# Patient Record
Sex: Female | Born: 1950 | Race: Black or African American | Hispanic: No | Marital: Married | Smoking: Former smoker
Health system: Southern US, Community
[De-identification: ages and names within clinical notes are randomized; demographics above are authoritative.]

## PROBLEM LIST (undated history)

## (undated) DIAGNOSIS — R4189 Other symptoms and signs involving cognitive functions and awareness: Secondary | ICD-10-CM

## (undated) DIAGNOSIS — E079 Disorder of thyroid, unspecified: Secondary | ICD-10-CM

## (undated) DIAGNOSIS — K219 Gastro-esophageal reflux disease without esophagitis: Secondary | ICD-10-CM

## (undated) DIAGNOSIS — E039 Hypothyroidism, unspecified: Secondary | ICD-10-CM

## (undated) DIAGNOSIS — F329 Major depressive disorder, single episode, unspecified: Secondary | ICD-10-CM

## (undated) DIAGNOSIS — F419 Anxiety disorder, unspecified: Secondary | ICD-10-CM

## (undated) DIAGNOSIS — I10 Essential (primary) hypertension: Secondary | ICD-10-CM

## (undated) DIAGNOSIS — N390 Urinary tract infection, site not specified: Secondary | ICD-10-CM

## (undated) DIAGNOSIS — D649 Anemia, unspecified: Secondary | ICD-10-CM

## (undated) DIAGNOSIS — Z803 Family history of malignant neoplasm of breast: Secondary | ICD-10-CM

## (undated) DIAGNOSIS — F32A Depression, unspecified: Secondary | ICD-10-CM

## (undated) DIAGNOSIS — Z860100 Personal history of colon polyps, unspecified: Secondary | ICD-10-CM

## (undated) DIAGNOSIS — E785 Hyperlipidemia, unspecified: Secondary | ICD-10-CM

## (undated) DIAGNOSIS — W57XXXA Bitten or stung by nonvenomous insect and other nonvenomous arthropods, initial encounter: Secondary | ICD-10-CM

## (undated) DIAGNOSIS — E7800 Pure hypercholesterolemia, unspecified: Secondary | ICD-10-CM

## (undated) DIAGNOSIS — Z800 Family history of malignant neoplasm of digestive organs: Secondary | ICD-10-CM

## (undated) DIAGNOSIS — Z801 Family history of malignant neoplasm of trachea, bronchus and lung: Secondary | ICD-10-CM

## (undated) DIAGNOSIS — M1990 Unspecified osteoarthritis, unspecified site: Secondary | ICD-10-CM

## (undated) DIAGNOSIS — Z8041 Family history of malignant neoplasm of ovary: Secondary | ICD-10-CM

## (undated) HISTORY — DX: Family history of malignant neoplasm of digestive organs: Z80.0

## (undated) HISTORY — DX: Major depressive disorder, single episode, unspecified: F32.9

## (undated) HISTORY — PX: POLYPECTOMY: SHX149

## (undated) HISTORY — DX: Personal history of colon polyps, unspecified: Z86.0100

## (undated) HISTORY — DX: Other symptoms and signs involving cognitive functions and awareness: R41.89

## (undated) HISTORY — DX: Family history of malignant neoplasm of breast: Z80.3

## (undated) HISTORY — DX: Family history of malignant neoplasm of ovary: Z80.41

## (undated) HISTORY — DX: Personal history of colonic polyps: Z86.010

## (undated) HISTORY — DX: Family history of malignant neoplasm of trachea, bronchus and lung: Z80.1

## (undated) HISTORY — DX: Hyperlipidemia, unspecified: E78.5

## (undated) HISTORY — DX: Anxiety disorder, unspecified: F41.9

## (undated) HISTORY — PX: COLONOSCOPY: SHX174

---

## 1999-03-18 ENCOUNTER — Encounter: Payer: Self-pay | Admitting: Obstetrics and Gynecology

## 1999-03-18 ENCOUNTER — Ambulatory Visit (HOSPITAL_COMMUNITY): Admission: RE | Admit: 1999-03-18 | Discharge: 1999-03-18 | Payer: Self-pay | Admitting: Obstetrics and Gynecology

## 1999-09-24 ENCOUNTER — Emergency Department (HOSPITAL_COMMUNITY): Admission: EM | Admit: 1999-09-24 | Discharge: 1999-09-24 | Payer: Self-pay | Admitting: Emergency Medicine

## 2000-06-13 ENCOUNTER — Encounter: Payer: Self-pay | Admitting: Family Medicine

## 2000-06-13 ENCOUNTER — Ambulatory Visit (HOSPITAL_COMMUNITY): Admission: RE | Admit: 2000-06-13 | Discharge: 2000-06-13 | Payer: Self-pay | Admitting: Family Medicine

## 2002-02-25 ENCOUNTER — Encounter: Admission: RE | Admit: 2002-02-25 | Discharge: 2002-02-25 | Payer: Self-pay | Admitting: Family Medicine

## 2002-02-25 ENCOUNTER — Encounter: Payer: Self-pay | Admitting: Family Medicine

## 2002-03-27 ENCOUNTER — Encounter (INDEPENDENT_AMBULATORY_CARE_PROVIDER_SITE_OTHER): Payer: Self-pay | Admitting: Specialist

## 2002-03-27 ENCOUNTER — Ambulatory Visit (HOSPITAL_COMMUNITY): Admission: RE | Admit: 2002-03-27 | Discharge: 2002-03-27 | Payer: Self-pay | Admitting: Gastroenterology

## 2004-01-28 ENCOUNTER — Ambulatory Visit (HOSPITAL_COMMUNITY): Admission: RE | Admit: 2004-01-28 | Discharge: 2004-01-28 | Payer: Self-pay | Admitting: Family Medicine

## 2005-03-28 ENCOUNTER — Other Ambulatory Visit: Admission: RE | Admit: 2005-03-28 | Discharge: 2005-03-28 | Payer: Self-pay | Admitting: Obstetrics and Gynecology

## 2005-04-15 ENCOUNTER — Ambulatory Visit (HOSPITAL_COMMUNITY): Admission: RE | Admit: 2005-04-15 | Discharge: 2005-04-15 | Payer: Self-pay | Admitting: Family Medicine

## 2006-09-18 ENCOUNTER — Emergency Department (HOSPITAL_COMMUNITY): Admission: EM | Admit: 2006-09-18 | Discharge: 2006-09-18 | Payer: Self-pay | Admitting: Family Medicine

## 2007-04-26 ENCOUNTER — Other Ambulatory Visit: Admission: RE | Admit: 2007-04-26 | Discharge: 2007-04-26 | Payer: Self-pay | Admitting: Family Medicine

## 2007-05-07 ENCOUNTER — Ambulatory Visit (HOSPITAL_COMMUNITY): Admission: RE | Admit: 2007-05-07 | Discharge: 2007-05-07 | Payer: Self-pay | Admitting: Family Medicine

## 2008-07-02 ENCOUNTER — Ambulatory Visit (HOSPITAL_COMMUNITY): Admission: RE | Admit: 2008-07-02 | Discharge: 2008-07-02 | Payer: Self-pay | Admitting: Family Medicine

## 2009-05-20 ENCOUNTER — Other Ambulatory Visit: Admission: RE | Admit: 2009-05-20 | Discharge: 2009-05-20 | Payer: Self-pay | Admitting: Family Medicine

## 2009-08-10 ENCOUNTER — Ambulatory Visit (HOSPITAL_COMMUNITY): Admission: RE | Admit: 2009-08-10 | Discharge: 2009-08-10 | Payer: Self-pay | Admitting: Family Medicine

## 2010-06-24 ENCOUNTER — Other Ambulatory Visit (HOSPITAL_COMMUNITY)
Admission: RE | Admit: 2010-06-24 | Discharge: 2010-06-24 | Disposition: A | Discharge status: Home / Self Care | Payer: Managed Care, Other (non HMO) | Source: Ambulatory Visit | Attending: Family Medicine | Admitting: Family Medicine

## 2010-06-24 ENCOUNTER — Other Ambulatory Visit: Payer: Self-pay | Admitting: Physician Assistant

## 2010-06-24 DIAGNOSIS — Z01419 Encounter for gynecological examination (general) (routine) without abnormal findings: Secondary | ICD-10-CM | POA: Insufficient documentation

## 2010-08-30 ENCOUNTER — Other Ambulatory Visit (HOSPITAL_COMMUNITY): Payer: Self-pay | Admitting: Family Medicine

## 2010-08-30 DIAGNOSIS — Z1231 Encounter for screening mammogram for malignant neoplasm of breast: Secondary | ICD-10-CM

## 2010-09-03 NOTE — Op Note (Signed)
NAME:  Karen [name], Karen [name]                        ACCOUNT NO.:  [id number]   MEDICAL RECORD NO.:  [id number]                   PATIENT TYPE:  AMB   LOCATION:  ENDO                                 FACILITY:  [facility]   PHYSICIAN:  Danise Edge, M.D.                DATE OF BIRTH:  May 04, 1950   DATE OF PROCEDURE:  03/27/2002  DATE OF DISCHARGE:                                 OPERATIVE REPORT   PROCEDURES:  1. Esophagogastroduodenoscopy.  2. Small bowel biopsy.  3. Colonoscopy.  4. Polypectomy.   PROCEDURE INDICATION:  Ms. Karen [name] is a 60 year old female, born  February 17, 1961.  Ms. Karen [name] has chronic constipation associated with  abdominal pain.  She has chronic iron deficiency anemia, probably secondary  to menorrhagia.  She recently was evaluated by Bryan Lemma. Manus Gunning, M.D.  Her  stool was guaiac-positive.  Her hemoglobin was 7.4 grams with a normal white  blood cell count and elevated platelet count.  Her MCV was microcytic.   Ms. Karen [name] has chronic gastroesophageal reflux, manifested by nocturnal  heartburn without dysphagia or odynophagia.   MEDICATIONS ALLERGIES:  None.   CHRONIC MEDICATIONS:  Triamterene, Accupril, Lexapro, Zantac.   PAST MEDICAL HISTORY:  1. Chronic gastroesophageal reflux.  2. Chronic iron deficiency anemia.  3. Menorrhagia.  4. Chronic constipation with abdominal pain.  5. Hypertension.  6. Remote cesarean section.  7. Uterine fibroids.   FAMILY HISTORY:  Negative for colon cancer.   ENDOSCOPIST:  Charolett Bumpers, M.D.   PREMEDICATION:  Versed 10 mg, Demerol 100 mg.   ENDOSCOPE:  Olympus pediatric colonoscope.   DESCRIPTION OF PROCEDURE:  Esophagogastroduodenoscopy:  After obtaining  informed consent, Ms. Karen [name] was placed in the left lateral decubitus  position.  I administered intravenous Demerol and intravenous Versed to  achieve conscious sedation for the procedure.  The patient's blood pressure,  oxygen saturation, and  cardiac rhythm were monitored throughout the  procedure and documented in the medical record.   The Olympus pediatric colonoscope was passed through the posterior  hypopharynx into the proximal esophagus without difficulty.  The hypopharynx  and larynx appeared normal.  I did not visualize the vocal cords.   ESOPHAGOSCOPY:  The proximal, mid, and lower segments of the esophagus  appear normal.   GASTROSCOPY:  There is a moderate-sized hiatal hernia.  Retroflexed view of  the gastric cardia and fundus was normal.  The gastric body, antrum, and  pylorus appeared normal.   DUODENOSCOPY:  The duodenal bulb, mid duodenum, and distal duodenum appear  normal.   Small bowel biopsies:  Two small bowel biopsies were taken from the second  portion of the duodenum.   ASSESSMENT:  Normal esophagogastroduodenoscopy except for the presence of a  moderate-sized hiatal hernia.  No signs of upper gastrointestinal bleeding.  Small bowel biopsies to rule out celiac sprue pending.   PROCEDURE:  Proctocolonoscopy to the cecum:  Anal inspection  was normal.  Digital rectal exam was normal.  The Olympus pediatric video colonoscope was  introduced into the rectum and advanced to the cecum.  Colonic preparation  for the exam today was satisfactory.   RECTUM:  Large internal hemorrhoids.  From the proximal rectum, a 1 mm  sessile polyp was removed with the electrocautery snare.  SIGMOID COLON AND DESCENDING COLON:  Normal.  SPLENIC FLEXURE:  Normal.  TRANSVERSE COLON:  Normal.  HEPATIC FLEXURE:  Normal.  ASCENDING COLON:  Normal.  CECUM AND ILEOCECAL VALVE:  Normal.   ASSESSMENT:  1. No signs of lower gastrointestinal bleeding.  2. From the proximal rectum, a 1 mm sessile polyp was removed.  3. Large internal hemorrhoids.   RECOMMENDATIONS:  If rectal polyp returns neoplastic pathologically, Ms.  Karen [name] would undergo a repeat colonoscopy in five years.                                                Danise Edge, M.D.    MJ/MEDQ  D:  03/27/2002  T:  03/27/2002  Job:  [id number]   cc:   Bryan Lemma. Manus Gunning, M.D.  [address] [place]  [place] [address]  Fax: [phone]

## 2010-09-08 ENCOUNTER — Ambulatory Visit (HOSPITAL_COMMUNITY): Payer: Managed Care, Other (non HMO)

## 2010-10-26 ENCOUNTER — Ambulatory Visit (HOSPITAL_COMMUNITY)
Admission: RE | Admit: 2010-10-26 | Discharge: 2010-10-26 | Disposition: A | Discharge status: Home / Self Care | Payer: Managed Care, Other (non HMO) | Source: Ambulatory Visit | Attending: Family Medicine | Admitting: Family Medicine

## 2010-10-26 DIAGNOSIS — Z1231 Encounter for screening mammogram for malignant neoplasm of breast: Secondary | ICD-10-CM | POA: Insufficient documentation

## 2011-10-19 ENCOUNTER — Other Ambulatory Visit (HOSPITAL_COMMUNITY): Payer: Self-pay | Admitting: Family Medicine

## 2011-10-19 DIAGNOSIS — Z1231 Encounter for screening mammogram for malignant neoplasm of breast: Secondary | ICD-10-CM

## 2011-11-10 ENCOUNTER — Ambulatory Visit (HOSPITAL_COMMUNITY)
Admission: RE | Admit: 2011-11-10 | Discharge: 2011-11-10 | Disposition: A | Discharge status: Home / Self Care | Payer: Managed Care, Other (non HMO) | Source: Ambulatory Visit | Attending: Family Medicine | Admitting: Family Medicine

## 2011-11-10 DIAGNOSIS — Z1231 Encounter for screening mammogram for malignant neoplasm of breast: Secondary | ICD-10-CM

## 2013-07-30 ENCOUNTER — Other Ambulatory Visit (HOSPITAL_COMMUNITY)
Admission: RE | Admit: 2013-07-30 | Discharge: 2013-07-30 | Disposition: A | Discharge status: Home / Self Care | Payer: Managed Care, Other (non HMO) | Source: Ambulatory Visit | Attending: Family Medicine | Admitting: Family Medicine

## 2013-07-30 ENCOUNTER — Other Ambulatory Visit: Payer: Self-pay | Admitting: Family Medicine

## 2013-07-30 DIAGNOSIS — Z124 Encounter for screening for malignant neoplasm of cervix: Secondary | ICD-10-CM | POA: Insufficient documentation

## 2013-09-12 ENCOUNTER — Other Ambulatory Visit (HOSPITAL_COMMUNITY): Payer: Self-pay | Admitting: Family Medicine

## 2013-09-12 DIAGNOSIS — Z1231 Encounter for screening mammogram for malignant neoplasm of breast: Secondary | ICD-10-CM

## 2013-09-19 ENCOUNTER — Ambulatory Visit (HOSPITAL_COMMUNITY)
Admission: RE | Admit: 2013-09-19 | Discharge: 2013-09-19 | Disposition: A | Discharge status: Home / Self Care | Payer: Medicare HMO | Source: Ambulatory Visit | Attending: Family Medicine | Admitting: Family Medicine

## 2013-09-19 DIAGNOSIS — Z1231 Encounter for screening mammogram for malignant neoplasm of breast: Secondary | ICD-10-CM | POA: Insufficient documentation

## 2014-12-31 ENCOUNTER — Emergency Department (HOSPITAL_COMMUNITY)
Admission: EM | Admit: 2014-12-31 | Discharge: 2015-01-01 | Disposition: A | Discharge status: Home / Self Care | Payer: Managed Care, Other (non HMO) | Attending: Emergency Medicine | Admitting: Emergency Medicine

## 2014-12-31 ENCOUNTER — Encounter (HOSPITAL_COMMUNITY): Payer: Self-pay | Admitting: Emergency Medicine

## 2014-12-31 DIAGNOSIS — M436 Torticollis: Secondary | ICD-10-CM | POA: Insufficient documentation

## 2014-12-31 DIAGNOSIS — Z79899 Other long term (current) drug therapy: Secondary | ICD-10-CM | POA: Diagnosis not present

## 2014-12-31 DIAGNOSIS — Z7982 Long term (current) use of aspirin: Secondary | ICD-10-CM | POA: Insufficient documentation

## 2014-12-31 DIAGNOSIS — E079 Disorder of thyroid, unspecified: Secondary | ICD-10-CM | POA: Insufficient documentation

## 2014-12-31 DIAGNOSIS — M542 Cervicalgia: Secondary | ICD-10-CM | POA: Diagnosis present

## 2014-12-31 DIAGNOSIS — I10 Essential (primary) hypertension: Secondary | ICD-10-CM | POA: Diagnosis not present

## 2014-12-31 HISTORY — DX: Disorder of thyroid, unspecified: E07.9

## 2014-12-31 HISTORY — DX: Essential (primary) hypertension: I10

## 2014-12-31 NOTE — ED Notes (Signed)
Pt reports posterior neck pain and severe stiffness. Now pain is moving around to anterior part of neck and shoulders. Says the stiffness is so bad she cannot move her neck or head. Recently had a cold/congestion. Denies productive cough. Says she has had "a bad feeling/sneezy runny nose/sore throat. Now it's like severe joint pain." No SOB noted. RR even/unlabored. Did not have flu shot.

## 2015-01-01 LAB — BASIC METABOLIC PANEL
Anion gap: 8 (ref 5–15)
BUN: 23 mg/dL — ABNORMAL HIGH (ref 6–20)
CALCIUM: 9.7 mg/dL (ref 8.9–10.3)
CHLORIDE: 101 mmol/L (ref 101–111)
CO2: 29 mmol/L (ref 22–32)
CREATININE: 0.82 mg/dL (ref 0.44–1.00)
Glucose, Bld: 117 mg/dL — ABNORMAL HIGH (ref 65–99)
Potassium: 3.4 mmol/L — ABNORMAL LOW (ref 3.5–5.1)
SODIUM: 138 mmol/L (ref 135–145)

## 2015-01-01 LAB — CBC WITH DIFFERENTIAL/PLATELET
BASOS PCT: 0 %
Basophils Absolute: 0 10*3/uL (ref 0.0–0.1)
EOS ABS: 0.1 10*3/uL (ref 0.0–0.7)
Eosinophils Relative: 1 %
HCT: 37.1 % (ref 36.0–46.0)
HEMOGLOBIN: 11.8 g/dL — AB (ref 12.0–15.0)
Lymphocytes Relative: 28 %
Lymphs Abs: 1.9 10*3/uL (ref 0.7–4.0)
MCH: 28.4 pg (ref 26.0–34.0)
MCHC: 31.8 g/dL (ref 30.0–36.0)
MCV: 89.2 fL (ref 78.0–100.0)
MONOS PCT: 7 %
Monocytes Absolute: 0.5 10*3/uL (ref 0.1–1.0)
NEUTROS PCT: 64 %
Neutro Abs: 4.5 10*3/uL (ref 1.7–7.7)
PLATELETS: 275 10*3/uL (ref 150–400)
RBC: 4.16 MIL/uL (ref 3.87–5.11)
RDW: 13.2 % (ref 11.5–15.5)
WBC: 7 10*3/uL (ref 4.0–10.5)

## 2015-01-01 MED ORDER — KETOROLAC TROMETHAMINE 30 MG/ML IJ SOLN
30.0000 mg | Freq: Once | INTRAMUSCULAR | Status: AC
  Administered 2015-01-01: 30 mg via INTRAVENOUS
  Filled 2015-01-01: qty 1

## 2015-01-01 MED ORDER — METHOCARBAMOL 1000 MG/10ML IJ SOLN
1000.0000 mg | Freq: Once | INTRAVENOUS | Status: AC
  Administered 2015-01-01: 1000 mg via INTRAVENOUS
  Filled 2015-01-01: qty 10

## 2015-01-01 MED ORDER — SODIUM CHLORIDE 0.9 % IV BOLUS (SEPSIS)
1000.0000 mL | Freq: Once | INTRAVENOUS | Status: AC
  Administered 2015-01-01: 1000 mL via INTRAVENOUS

## 2015-01-01 MED ORDER — METHOCARBAMOL 1000 MG/10ML IJ SOLN: 1000.0000 mg | Freq: Once | INTRAMUSCULAR | Status: DC

## 2015-01-01 MED ORDER — HYDROCODONE-ACETAMINOPHEN 5-325 MG PO TABS: 1.0000 | ORAL_TABLET | Freq: Four times a day (QID) | ORAL | Status: DC | PRN

## 2015-01-01 MED ORDER — MORPHINE SULFATE (PF) 4 MG/ML IV SOLN
4.0000 mg | Freq: Once | INTRAVENOUS | Status: AC
  Administered 2015-01-01: 4 mg via INTRAVENOUS
  Filled 2015-01-01: qty 1

## 2015-01-01 MED ORDER — DIAZEPAM 5 MG PO TABS: 5.0000 mg | ORAL_TABLET | Freq: Four times a day (QID) | ORAL | Status: DC | PRN

## 2015-01-01 MED ORDER — LORAZEPAM 2 MG/ML IJ SOLN
0.5000 mg | Freq: Once | INTRAMUSCULAR | Status: AC
  Administered 2015-01-01: 0.5 mg via INTRAVENOUS
  Filled 2015-01-01: qty 1

## 2015-01-01 MED ORDER — METHOCARBAMOL 500 MG PO TABS: 1000.0000 mg | ORAL_TABLET | Freq: Three times a day (TID) | ORAL | Status: DC | PRN

## 2015-01-01 MED ORDER — IBUPROFEN 600 MG PO TABS: 600.0000 mg | ORAL_TABLET | Freq: Four times a day (QID) | ORAL | Status: DC | PRN

## 2015-01-01 NOTE — ED Notes (Signed)
Patient states throbbing has resolved, still c/o stiffness. Patient ambulatory with minimal assistance to restroom.

## 2015-01-01 NOTE — ED Notes (Signed)
Patient c/o ongoing neck pain for several days, states this evening it began to radiate into her head. Patient states she is unable to move her neck. Patient states she has used heat, and muscle creams at home without relief.

## 2015-01-01 NOTE — ED Provider Notes (Signed)
CSN: [id number]     Arrival date & time 12/31/14  2320 History  This chart was scribed for Julianne Rice, MD by Randa Evens, ED Scribe. This patient was seen in room WA08/WA08 and the patient's care was started at 1:24 AM.      Chief Complaint  Patient presents with  . Neck Stiffness   . Shoulder Pain   The history is provided by the patient. No language interpreter was used.   HPI Comments: Karen [name] is a 64 y.o. female who presents to the Emergency Department complaining of worsening right sided neck pain and stiffness onset 1 week prior. Pt describes the neck pain as a soreness. Pt states that the pain radiates up the posterior aspect of her head. Pt does report URI symptoms 2 weeks prior. Pt states that she has been taking Advil with no relief. No fever or chills. Pt denies CP, nausea, vomiting or other related symptoms. No focal weakness or numbness.    Past Medical History  Diagnosis Date  . Hypertension   . Thyroid disease    Past Surgical History  Procedure Laterality Date  . Cesarean section     History reviewed. No pertinent family history. Social History  Substance Use Topics  . Smoking status: Never Smoker   . Smokeless tobacco: None  . Alcohol Use: No   OB History    No data available     Review of Systems  Constitutional: Negative for fever and chills.  HENT: Negative for congestion, sinus pressure and sore throat.   Eyes: Negative for visual disturbance.  Respiratory: Negative for shortness of breath.   Cardiovascular: Negative for chest pain.  Gastrointestinal: Negative for nausea, vomiting and abdominal pain.  Musculoskeletal: Positive for myalgias, neck pain and neck stiffness.  Skin: Negative for rash and wound.  Neurological: Negative for dizziness, weakness, numbness and headaches.  All other systems reviewed and are negative.    Allergies  Zoloft  Home Medications   Prior to Admission medications   Medication Sig Start Date End  Date Taking? Authorizing Provider  aspirin 325 MG tablet Take 325 mg by mouth daily.   Yes Historical Provider, MD  atenolol (TENORMIN) 50 MG tablet Take 50 mg by mouth daily. for blood pressure 11/03/14  Yes Historical Provider, MD  cholecalciferol (VITAMIN D) 1000 UNITS tablet Take 1,000 Units by mouth daily.   Yes Historical Provider, MD  CRESTOR 5 MG tablet Take 1 tablet by mouth every morning. 11/30/14  Yes Historical Provider, MD  levothyroxine (SYNTHROID, LEVOTHROID) 50 MCG tablet Take 1 tablet by mouth daily. 10/27/14  Yes Historical Provider, MD  Multiple Vitamin (MULTIVITAMIN WITH MINERALS) TABS tablet Take 1 tablet by mouth daily.   Yes Historical Provider, MD  traMADol (ULTRAM) 50 MG tablet Take 1 tablet by mouth See admin instructions. Up to twice daily as needed for pain 12/02/14  Yes Historical Provider, MD  triamterene-hydrochlorothiazide (MAXZIDE) 75-50 MG per tablet Take 1 tablet by mouth every morning. 10/27/14  Yes Historical Provider, MD  vitamin B-12 (CYANOCOBALAMIN) 1000 MCG tablet Take 1,000 mcg by mouth daily.   Yes Historical Provider, MD  vitamin C (ASCORBIC ACID) 500 MG tablet Take 500 mg by mouth daily.   Yes Historical Provider, MD  diazepam (VALIUM) 5 MG tablet Take 1 tablet (5 mg total) by mouth every 6 (six) hours as needed for muscle spasms (spasms). 01/01/15   Julianne Rice, MD  HYDROcodone-acetaminophen (NORCO) 5-325 MG per tablet Take 1-2 tablets by mouth every  6 (six) hours as needed for severe pain. 01/01/15   Julianne Rice, MD  ibuprofen (ADVIL,MOTRIN) 600 MG tablet Take 1 tablet (600 mg total) by mouth every 6 (six) hours as needed for moderate pain. 01/01/15   Julianne Rice, MD  methocarbamol (ROBAXIN) 500 MG tablet Take 2 tablets (1,000 mg total) by mouth every 8 (eight) hours as needed for muscle spasms. 01/01/15   Julianne Rice, MD   BP 140/78 mmHg  Pulse 87  Temp(Src) 98.4 F (36.9 C) (Oral)  Resp 18  Ht 5\' 1"  (1.549 m)  Wt 206 lb (93.441 kg)  BMI  38.94 kg/m2  SpO2 98%   Physical Exam  Constitutional: She is oriented to person, place, and time. She appears well-developed and well-nourished. No distress.  HENT:  Head: Normocephalic and atraumatic.  Mouth/Throat: Oropharynx is clear and moist. No oropharyngeal exudate.  Eyes: EOM are normal. Pupils are equal, round, and reactive to light.  Neck:  Patient with decreased range of motion of her neck. This to palpation over the right paracervical and right sternocleidomastoid with spasm appreciated. No tenderness to palpation in the posterior midline cervical spine.  Cardiovascular: Normal rate and regular rhythm.   Pulmonary/Chest: Effort normal and breath sounds normal. No respiratory distress. She has no wheezes. She has no rales.  Abdominal: Soft. Bowel sounds are normal. She exhibits no distension and no mass. There is no tenderness. There is no rebound and no guarding.  Musculoskeletal: Normal range of motion. She exhibits no edema or tenderness.  Neurological: She is alert and oriented to person, place, and time.  5/5 motor in all extremities. Sensation is fully intact.  Skin: Skin is warm and dry. No rash noted. No erythema.  Psychiatric: She has a normal mood and affect. Her behavior is normal.  Nursing note and vitals reviewed.   ED Course  Procedures (including critical care time) DIAGNOSTIC STUDIES: Oxygen Saturation is 100% on RA, normal by my interpretation.    COORDINATION OF CARE: 1:55 AM-Discussed treatment plan with pt at bedside and pt agreed to plan.     Labs Review Labs Reviewed  CBC WITH DIFFERENTIAL/PLATELET - Abnormal; Notable for the following:    Hemoglobin 11.8 (*)    All other components within normal limits  BASIC METABOLIC PANEL - Abnormal; Notable for the following:    Potassium 3.4 (*)    Glucose, Bld 117 (*)    BUN 23 (*)    All other components within normal limits    Imaging Review No results found.    EKG Interpretation None       MDM   Final diagnoses:  Torticollis      I personally performed the services described in this documentation, which was scribed in my presence. The recorded information has been reviewed and is accurate.  Patient with ongoing neck pain and stiffness for 1 week. No fever or headache. No focal neurologic symptoms. Muscle spasm appreciated on exam. Suspect torticollis. Less suspicious for meningitis.  Patient's symptoms have improved. She now has some neck movement. Vital signs remain stable. She has a normal white blood cell count. Again I think infectious cause for the patient's symptoms are very unlikely. We'll re-dose medication. Anticipate discharge home.  Patient says she is feeling much better. Has increased range of motion of her neck. No meningismus appreciated. Return precautions given. Advised to follow-up with her primary physician  Julianne Rice, MD 01/01/15 [phone]

## 2015-01-01 NOTE — Discharge Instructions (Signed)

## 2015-03-25 ENCOUNTER — Other Ambulatory Visit: Payer: Self-pay

## 2015-03-25 DIAGNOSIS — Z1231 Encounter for screening mammogram for malignant neoplasm of breast: Secondary | ICD-10-CM

## 2015-03-27 ENCOUNTER — Ambulatory Visit
Admission: RE | Admit: 2015-03-27 | Discharge: 2015-03-27 | Disposition: A | Discharge status: Home / Self Care | Payer: Managed Care, Other (non HMO) | Source: Ambulatory Visit

## 2015-03-27 DIAGNOSIS — Z1231 Encounter for screening mammogram for malignant neoplasm of breast: Secondary | ICD-10-CM

## 2015-08-17 DIAGNOSIS — W57XXXA Bitten or stung by nonvenomous insect and other nonvenomous arthropods, initial encounter: Secondary | ICD-10-CM

## 2015-08-17 HISTORY — DX: Bitten or stung by nonvenomous insect and other nonvenomous arthropods, initial encounter: W57.XXXA

## 2015-08-26 ENCOUNTER — Other Ambulatory Visit: Payer: Self-pay | Admitting: Family Medicine

## 2015-08-26 DIAGNOSIS — R1084 Generalized abdominal pain: Secondary | ICD-10-CM

## 2015-09-03 ENCOUNTER — Ambulatory Visit
Admission: RE | Admit: 2015-09-03 | Discharge: 2015-09-03 | Disposition: A | Discharge status: Home / Self Care | Payer: Managed Care, Other (non HMO) | Source: Ambulatory Visit | Attending: Family Medicine | Admitting: Family Medicine

## 2015-09-03 DIAGNOSIS — R1084 Generalized abdominal pain: Secondary | ICD-10-CM

## 2015-10-17 DIAGNOSIS — N390 Urinary tract infection, site not specified: Secondary | ICD-10-CM

## 2015-10-17 HISTORY — DX: Urinary tract infection, site not specified: N39.0

## 2015-10-31 ENCOUNTER — Ambulatory Visit (INDEPENDENT_AMBULATORY_CARE_PROVIDER_SITE_OTHER): Payer: Managed Care, Other (non HMO) | Admitting: Family Medicine

## 2015-10-31 VITALS — BP 138/80 | HR 82 | Temp 98.1°F | Resp 14 | Ht 62.0 in | Wt 193.0 lb

## 2015-10-31 DIAGNOSIS — N760 Acute vaginitis: Secondary | ICD-10-CM

## 2015-10-31 LAB — POC MICROSCOPIC URINALYSIS (UMFC)

## 2015-10-31 LAB — POCT WET + KOH PREP
TRICH BY WET PREP: ABSENT
YEAST BY KOH: ABSENT
YEAST BY WET PREP: ABSENT

## 2015-10-31 LAB — POCT URINALYSIS DIP (MANUAL ENTRY)
BILIRUBIN UA: NEGATIVE
BILIRUBIN UA: NEGATIVE
Glucose, UA: NEGATIVE
NITRITE UA: NEGATIVE
PH UA: 7
PROTEIN UA: NEGATIVE
Spec Grav, UA: 1.015
Urobilinogen, UA: 1

## 2015-10-31 MED ORDER — CEPHALEXIN 500 MG PO CAPS: 500.0000 mg | ORAL_CAPSULE | Freq: Two times a day (BID) | ORAL | Status: DC

## 2015-10-31 MED ORDER — FLUCONAZOLE 150 MG PO TABS: 150.0000 mg | ORAL_TABLET | Freq: Once | ORAL | Status: DC

## 2015-10-31 NOTE — Progress Notes (Signed)
Karen [name] is a 65 y.o. female who presents to [facility] today for vaginal irritation for the last few weeks. Patient denies significant discharge dysuria and urinary frequency or urgency. She denies any fevers chills nausea vomiting or diarrhea. She notes these symptoms were preceded by a course of antibiotics and a wellness vaginal exam. She has tried some home remedies but no over-the-counter medications.   Past Medical History  Diagnosis Date  . Hypertension   . Thyroid disease    Past Surgical History  Procedure Laterality Date  . Cesarean section     Social History  Substance Use Topics  . Smoking status: Never Smoker   . Smokeless tobacco: Not on file  . Alcohol Use: No   ROS as above Medications: Current Outpatient Prescriptions  Medication Sig Dispense Refill  . aspirin 325 MG tablet Take 325 mg by mouth daily.    Marland Kitchen atenolol (TENORMIN) 50 MG tablet Take 50 mg by mouth daily. for blood pressure  3  . cholecalciferol (VITAMIN D) 1000 UNITS tablet Take 1,000 Units by mouth daily.    . CRESTOR 5 MG tablet Take 1 tablet by mouth every morning.  2  . levothyroxine (SYNTHROID, LEVOTHROID) 50 MCG tablet Take 1 tablet by mouth daily.  3  . traMADol (ULTRAM) 50 MG tablet Take 1 tablet by mouth See admin instructions. Up to twice daily as needed for pain  0  . triamterene-hydrochlorothiazide (MAXZIDE) 75-50 MG per tablet Take 1 tablet by mouth every morning.  3  . vitamin C (ASCORBIC ACID) 500 MG tablet Take 500 mg by mouth daily.    . cephALEXin (KEFLEX) 500 MG capsule Take 1 capsule (500 mg total) by mouth 2 (two) times daily. 14 capsule 0  . fluconazole (DIFLUCAN) 150 MG tablet Take 1 tablet (150 mg total) by mouth once. 1 tablet 0   No current facility-administered medications for this visit.   Allergies  Allergen Reactions  . Zoloft [Sertraline Hcl] Other (See Comments)    "crazy feeling"     Exam:  BP 138/80 mmHg  Pulse 82  Temp(Src) 98.1 F (36.7 C)  (Oral)  Resp 14  Ht 5\' 2"  (1.575 m)  Wt 193 lb (87.544 kg)  BMI 35.29 kg/m2  SpO2 96% Gen: Well NAD HEENT: EOMI,  MMM Lungs: Normal work of breathing. CTABL Heart: RRR no MRG Abd: NABS, Soft. Nondistended, Nontender Exts: Brisk capillary refill, warm and well perfused.  GYN: Normal external genitalia. Vaginal canal with thin clear discharge. Normal-appearing cervix. Nontender.  Results for orders placed or performed in visit on 10/31/15 (from the past 24 hour(s))  POCT Wet + KOH Prep     Status: Abnormal   Collection Time: 10/31/15 10:55 AM  Result Value Ref Range   Yeast by KOH Absent Present, Absent   Yeast by wet prep Absent Present, Absent   WBC by wet prep Moderate (A) None, Few, Too numerous to count   Clue Cells Wet Prep HPF POC None None, Too numerous to count   Trich by wet prep Absent Present, Absent   Bacteria Wet Prep HPF POC Few None, Few, Too numerous to count   Epithelial Cells By [employer] ([facility]) Few None, Few, Too numerous to count   RBC,UR,HPF,POC None None RBC/hpf  POCT urinalysis dipstick     Status: Abnormal   Collection Time: 10/31/15 10:55 AM  Result Value Ref Range   Color, UA yellow yellow   Clarity, UA cloudy (A) clear  Glucose, UA negative negative   Bilirubin, UA negative negative   Ketones, POC UA negative negative   Spec Grav, UA 1.015    Blood, UA small (A) negative   pH, UA 7.0    Protein Ur, POC negative negative   Urobilinogen, UA 1.0    Nitrite, UA Negative Negative   Leukocytes, UA Trace (A) Negative  POCT Microscopic Urinalysis ([facility])     Status: Abnormal   Collection Time: 10/31/15 10:55 AM  Result Value Ref Range   WBC,UR,HPF,POC Few (A) None WBC/hpf   RBC,UR,HPF,POC Moderate (A) None RBC/hpf   Bacteria Few (A) None, Too numerous to count   Mucus Present (A) Absent   Epithelial Cells, UR Per Microscopy Moderate (A) None, Too numerous to count cells/hpf   No results found.  Assessment and Plan: 65 y.o. female with Vaginal  irritation. Probable UTI based on wet prep results. Urine culture pending. Empiric treatment with Keflex. Backup fluconazole in case patient does not improve. Recommend patient follow-up with PCP for unaddressed health maintenance items as well as other health concerns.  Discussed warning signs or symptoms. Please see discharge instructions. Patient expresses understanding.

## 2015-10-31 NOTE — Patient Instructions (Addendum)
Thank you for coming in today. I think you have a UTI.  Take keflex twice daily.  If the itching does not get better take fluconazole for yeast infection.   Follow up with me as needed.   Follow up with me Dr [facility] Address: [address] [place], [place] [address] Phone: [phone]  Urinary Tract Infection Urinary tract infections (UTIs) can develop anywhere along your urinary tract. Your urinary tract is your body's drainage system for removing wastes and extra water. Your urinary tract includes two kidneys, two ureters, a bladder, and a urethra. Your kidneys are a pair of bean-shaped organs. Each kidney is about the size of your fist. They are located below your ribs, one on each side of your spine. CAUSES Infections are caused by microbes, which are microscopic organisms, including fungi, viruses, and bacteria. These organisms are so small that they can only be seen through a microscope. Bacteria are the microbes that most commonly cause UTIs. SYMPTOMS  Symptoms of UTIs may vary by age and gender of the patient and by the location of the infection. Symptoms in young women typically include a frequent and intense urge to urinate and a painful, burning feeling in the bladder or urethra during urination. Older women and men are more likely to be tired, shaky, and weak and have muscle aches and abdominal pain. A fever may mean the infection is in your kidneys. Other symptoms of a kidney infection include pain in your back or sides below the ribs, nausea, and vomiting. DIAGNOSIS To diagnose a UTI, your caregiver will ask you about your symptoms. Your caregiver will also ask you to provide a urine sample. The urine sample will be tested for bacteria and white blood cells. White blood cells are made by your body to help fight infection. TREATMENT  Typically, UTIs can be treated with medication. Because most UTIs are caused by a bacterial infection, they usually  can be treated with the use of antibiotics. The choice of antibiotic and length of treatment depend on your symptoms and the type of bacteria causing your infection. HOME CARE INSTRUCTIONS  If you were prescribed antibiotics, take them exactly as your caregiver instructs you. Finish the medication even if you feel better after you have only taken some of the medication.  Drink enough water and fluids to keep your urine clear or pale yellow.  Avoid caffeine, tea, and carbonated beverages. They tend to irritate your bladder.  Empty your bladder often. Avoid holding urine for long periods of time.  Empty your bladder before and after sexual intercourse.  After a bowel movement, women should cleanse from front to back. Use each tissue only once. SEEK MEDICAL CARE IF:   You have back pain.  You develop a fever.  Your symptoms do not begin to resolve within 3 days. SEEK IMMEDIATE MEDICAL CARE IF:   You have severe back pain or lower abdominal pain.  You develop chills.  You have nausea or vomiting.  You have continued burning or discomfort with urination. MAKE SURE YOU:   Understand these instructions.  Will watch your condition.  Will get help right away if you are not doing well or get worse.   This information is not intended to replace advice given to you by your health care provider. Make sure you discuss any questions you have with your health care provider.   Document Released: 01/12/2005 Document Revised: 12/24/2014 Document Reviewed: 05/13/2011 Elsevier Interactive Patient Education 2016  [employer].     IF you received an x-ray today, you will receive an invoice from [facility]. Please contact [facility] at [phone] with questions or concerns regarding your invoice.   IF you received labwork today, you will receive an invoice from [employer]. Please contact [facility] at [phone] with questions or concerns  regarding your invoice.   Our billing staff will not be able to assist you with questions regarding bills from these companies.  You will be contacted with the lab results as soon as they are available. The fastest way to get your results is to activate your My Chart account. Instructions are located on the last page of this paperwork. If you have not heard from [place] regarding the results in 2 weeks, please contact this office.

## 2015-11-01 LAB — URINE CULTURE

## 2015-11-05 ENCOUNTER — Other Ambulatory Visit: Payer: Self-pay | Admitting: Gastroenterology

## 2015-11-06 ENCOUNTER — Telehealth: Payer: Self-pay

## 2015-11-06 MED ORDER — FLUCONAZOLE 150 MG PO TABS: 150.0000 mg | ORAL_TABLET | Freq: Once | ORAL | Status: DC

## 2015-11-06 NOTE — Telephone Encounter (Signed)
I called the patient back about her lab results. She is feeling better but notes that she thinks she is getting a yeast infection from the antibiotic.  I will send in a fluconazole Rx

## 2015-11-06 NOTE — Telephone Encounter (Signed)
Patient would like a phone call once labs have been reviewed. [phone]

## 2015-11-11 ENCOUNTER — Encounter (HOSPITAL_COMMUNITY): Payer: Self-pay | Admitting: *Deleted

## 2015-11-17 ENCOUNTER — Ambulatory Visit (HOSPITAL_COMMUNITY): Payer: Managed Care, Other (non HMO) | Admitting: Certified Registered"

## 2015-11-17 ENCOUNTER — Encounter (HOSPITAL_COMMUNITY): Payer: Self-pay | Admitting: Certified Registered"

## 2015-11-17 ENCOUNTER — Ambulatory Visit (HOSPITAL_COMMUNITY)
Admission: RE | Admit: 2015-11-17 | Discharge: 2015-11-17 | Disposition: A | Discharge status: Home / Self Care | Payer: Managed Care, Other (non HMO) | Source: Ambulatory Visit | Attending: Gastroenterology | Admitting: Gastroenterology

## 2015-11-17 ENCOUNTER — Encounter (HOSPITAL_COMMUNITY)
Admission: RE | Disposition: A | Discharge status: Home / Self Care | Payer: Self-pay | Source: Ambulatory Visit | Attending: Gastroenterology

## 2015-11-17 DIAGNOSIS — R140 Abdominal distension (gaseous): Secondary | ICD-10-CM | POA: Insufficient documentation

## 2015-11-17 DIAGNOSIS — R1030 Lower abdominal pain, unspecified: Secondary | ICD-10-CM | POA: Insufficient documentation

## 2015-11-17 DIAGNOSIS — Z7982 Long term (current) use of aspirin: Secondary | ICD-10-CM | POA: Insufficient documentation

## 2015-11-17 DIAGNOSIS — E039 Hypothyroidism, unspecified: Secondary | ICD-10-CM | POA: Insufficient documentation

## 2015-11-17 DIAGNOSIS — Z800 Family history of malignant neoplasm of digestive organs: Secondary | ICD-10-CM | POA: Diagnosis not present

## 2015-11-17 DIAGNOSIS — I10 Essential (primary) hypertension: Secondary | ICD-10-CM | POA: Insufficient documentation

## 2015-11-17 DIAGNOSIS — Z79899 Other long term (current) drug therapy: Secondary | ICD-10-CM | POA: Diagnosis not present

## 2015-11-17 DIAGNOSIS — E7800 Pure hypercholesterolemia, unspecified: Secondary | ICD-10-CM | POA: Insufficient documentation

## 2015-11-17 DIAGNOSIS — Z87891 Personal history of nicotine dependence: Secondary | ICD-10-CM | POA: Insufficient documentation

## 2015-11-17 DIAGNOSIS — K5900 Constipation, unspecified: Secondary | ICD-10-CM | POA: Insufficient documentation

## 2015-11-17 DIAGNOSIS — K648 Other hemorrhoids: Secondary | ICD-10-CM | POA: Insufficient documentation

## 2015-11-17 HISTORY — DX: Depression, unspecified: F32.A

## 2015-11-17 HISTORY — DX: Major depressive disorder, single episode, unspecified: F32.9

## 2015-11-17 HISTORY — DX: Pure hypercholesterolemia, unspecified: E78.00

## 2015-11-17 HISTORY — PX: COLONOSCOPY WITH PROPOFOL: SHX5780

## 2015-11-17 HISTORY — DX: Urinary tract infection, site not specified: N39.0

## 2015-11-17 HISTORY — DX: Anemia, unspecified: D64.9

## 2015-11-17 HISTORY — DX: Hypothyroidism, unspecified: E03.9

## 2015-11-17 HISTORY — DX: Gastro-esophageal reflux disease without esophagitis: K21.9

## 2015-11-17 HISTORY — DX: Unspecified osteoarthritis, unspecified site: M19.90

## 2015-11-17 HISTORY — DX: Bitten or stung by nonvenomous insect and other nonvenomous arthropods, initial encounter: W57.XXXA

## 2015-11-17 SURGERY — COLONOSCOPY WITH PROPOFOL
Anesthesia: Monitor Anesthesia Care

## 2015-11-17 MED ORDER — PROPOFOL 10 MG/ML IV BOLUS
INTRAVENOUS | Status: DC | PRN
  Administered 2015-11-17: 100 mg via INTRAVENOUS
  Administered 2015-11-17 (×2): 50 mg via INTRAVENOUS
  Administered 2015-11-17: 100 mg via INTRAVENOUS

## 2015-11-17 MED ORDER — PROPOFOL 10 MG/ML IV BOLUS
INTRAVENOUS | Status: AC
  Filled 2015-11-17: qty 20

## 2015-11-17 MED ORDER — LIDOCAINE HCL (CARDIAC) 20 MG/ML IV SOLN
INTRAVENOUS | Status: AC
  Filled 2015-11-17: qty 5

## 2015-11-17 MED ORDER — SODIUM CHLORIDE 0.9 % IV SOLN: INTRAVENOUS | Status: DC

## 2015-11-17 MED ORDER — LACTATED RINGERS IV SOLN
INTRAVENOUS | Status: DC
  Administered 2015-11-17: 11:00:00 via INTRAVENOUS

## 2015-11-17 MED ORDER — LIDOCAINE HCL (PF) 2 % IJ SOLN
INTRAMUSCULAR | Status: DC | PRN
Start: 2015-11-17 — End: 2015-11-17
  Administered 2015-11-17: 20 mg via INTRADERMAL

## 2015-11-17 SURGICAL SUPPLY — 22 items

## 2015-11-17 NOTE — Discharge Instructions (Signed)

## 2015-11-17 NOTE — Anesthesia Preprocedure Evaluation (Signed)
Anesthesia Evaluation  Patient identified by MRN, date of birth, ID band Patient awake    Reviewed: Allergy & Precautions, NPO status , Patient's Chart, lab work & pertinent test results  Airway Mallampati: II  TM Distance: >3 FB Neck ROM: Full    Dental no notable dental hx.    Pulmonary neg pulmonary ROS, former smoker,    Pulmonary exam normal breath sounds clear to auscultation       Cardiovascular hypertension, Pt. on medications Normal cardiovascular exam Rhythm:Regular Rate:Normal     Neuro/Psych negative neurological ROS  negative psych ROS   GI/Hepatic negative GI ROS, Neg liver ROS,   Endo/Other  Hypothyroidism   Renal/GU negative Renal ROS  negative genitourinary   Musculoskeletal negative musculoskeletal ROS (+)   Abdominal   Peds negative pediatric ROS (+)  Hematology negative hematology ROS (+)   Anesthesia Other Findings   Reproductive/Obstetrics negative OB ROS                             Anesthesia Physical Anesthesia Plan  ASA: II  Anesthesia Plan: MAC   Post-op Pain Management:    Induction:   Airway Management Planned: Simple Face Mask  Additional Equipment:   Intra-op Plan:   Post-operative Plan:   Informed Consent: I have reviewed the patients History and Physical, chart, labs and discussed the procedure including the risks, benefits and alternatives for the proposed anesthesia with the patient or authorized representative who has indicated his/her understanding and acceptance.   Dental advisory given  Plan Discussed with: CRNA  Anesthesia Plan Comments:         Anesthesia Quick Evaluation

## 2015-11-17 NOTE — Transfer of Care (Signed)
Immediate Anesthesia Transfer of Care Note  Patient: Karen [name]  Procedure(s) Performed: Procedure(s): COLONOSCOPY WITH PROPOFOL (N/A)  Patient Location: PACU  Anesthesia Type:MAC  Level of Consciousness:  sedated, patient cooperative and responds to stimulation  Airway & Oxygen Therapy:Patient Spontanous Breathing and Patient connected to face mask oxgen  Post-op Assessment:  Report given to PACU RN and Post -op Vital signs reviewed and stable  Post vital signs:  Reviewed and stable  Last Vitals:  Vitals:   11/17/15 1030  BP: 124/79  Pulse: 76  Resp: 15  Temp: [id number] C    Complications: No apparent anesthesia complications

## 2015-11-17 NOTE — Op Note (Signed)
[facility] Patient Name: Karen [name] Procedure Date: 11/17/2015 MRN: [id number] Attending MD: Garlan Fair , MD Date of Birth: March 12, 1951 CSN: [id number] Age: 65 Admit Type: Outpatient Procedure:                Colonoscopy Indications:              Screening for colorectal malignant neoplasm Providers:                Garlan Fair, MD, Carolynn Comment, RN, Cherylynn Ridges, Technician, Lajuana Carry, CRNA Referring MD:              Medicines:                Propofol per Anesthesia Complications:            No immediate complications. Estimated Blood Loss:     Estimated blood loss: none. Procedure:                Pre-Anesthesia Assessment:                           - Prior to the procedure, a History and Physical                            was performed, and patient medications and                            allergies were reviewed. The patient's tolerance of                            previous anesthesia was also reviewed. The risks                            and benefits of the procedure and the sedation                            options and risks were discussed with the patient.                            All questions were answered, and informed consent                            was obtained. Prior Anticoagulants: The patient has                            taken aspirin, last dose was 1 day prior to                            procedure. ASA Grade Assessment: II - A patient                            with mild systemic disease. After reviewing the  risks and benefits, the patient was deemed in                            satisfactory condition to undergo the procedure.                           After obtaining informed consent, the colonoscope                            was passed under direct vision. Throughout the                            procedure, the patient's blood pressure, pulse, and                 oxygen saturations were monitored continuously. The                            [id number] scope was introduced through                            the anus and advanced to the the cecum, identified                            by appendiceal orifice and ileocecal valve. The                            colonoscopy was somewhat difficult due to                            significant looping. The patient tolerated the                            procedure well. The quality of the bowel                            preparation was good. The ileocecal valve, the                            appendiceal orifice and the rectum were                            photographed. Scope In: 10:44:14 AM Scope Out: 11:07:11 AM Scope Withdrawal Time: 0 hours 9 minutes 54 seconds  Total Procedure Duration: 0 hours 22 minutes 57 seconds  Findings:      The perianal and digital rectal examinations were normal.      The entire examined colon appeared normal. Large internal hemorrhoids       were present. Impression:               - The entire examined colon is normal.                           - No specimens collected. Moderate Sedation:      N/A- Per Anesthesia Care Recommendation:           -  Patient has a contact number available for                            emergencies. The signs and symptoms of potential                            delayed complications were discussed with the                            patient. Return to normal activities tomorrow.                            Written discharge instructions were provided to the                            patient.                           - Repeat colonoscopy in 10 years for screening                            purposes.                           - Resume previous diet.                           - Continue present medications. Procedure Code(s):        --- Professional ---                           [id number], Colorectal cancer screening;  colonoscopy on                            individual not meeting criteria for high risk Diagnosis Code(s):        --- Professional ---                           Z12.11, Encounter for screening for malignant                            neoplasm of colon CPT copyright 2016 American Medical Association. All rights reserved. The codes documented in this report are preliminary and upon coder review may  be revised to meet current compliance requirements. Earle Gell, MD Garlan Fair, MD 11/17/2015 11:14:46 AM This report has been signed electronically. Number of Addenda: 0

## 2015-11-17 NOTE — H&P (Signed)
  Procedure: Screening colonoscopy. 03/27/2002 normal esophagogastroduodenoscopy with small bowel biopsies was performed. 04/17/2002 normal screening colonoscopy was performed. Unexplained hypercalcemia.  History: The patient is a 65 year old female born 1950-08-22. She underwent a normal esophagogastroduodenoscopy with small bowel biopsies and screening colonoscopy in 04-17-2002. Her son recently died of pancreatic cancer in his 13s. She has been under a great deal of stress and experiencing generalized lower abdominal discomfort associated with constipation and intermittent bloating without gastrointestinal bleeding or anorexia.  Her thyroid stimulating hormone level was normal. CBC was normal. Complete metabolic profile showed a significantly elevated serum calcium 11.1. A parathyroid hormone level was drawn a few years ago when her serum calcium was slightly elevated and the parathyroid hormone level was at the upper limits of normal. She does take hydrochlorothiazide which can cause serum calcium elevation.  She is scheduled to undergo a repeat screening colonoscopy today. She has an appointment with an endocrinologist to evaluate her elevated serum calcium later this month.  Past medical history: Hypertension. Hypercholesterolemia. Hypothyroidism. Depression. Osteoarthritis of the knees. Cesarean sections.  Medication allergies: Zoloft  Exam: The patient is alert and lying comfortably on the endoscopy stretcher. Abdomen is soft and nontender to palpation. Lungs are clear to auscultation. Cardiac exam reveals a regular rhythm.  Plan: Proceed with screening colonoscopy

## 2015-11-17 NOTE — Anesthesia Postprocedure Evaluation (Signed)
Anesthesia Post Note  Patient: Karen [name]  Procedure(s) Performed: Procedure(s) (LRB): COLONOSCOPY WITH PROPOFOL (N/A)  Patient location during evaluation: Endoscopy Anesthesia Type: MAC Level of consciousness: awake and alert Pain management: pain level controlled Vital Signs Assessment: post-procedure vital signs reviewed and stable Respiratory status: spontaneous breathing, nonlabored ventilation, respiratory function stable and patient connected to nasal cannula oxygen Cardiovascular status: stable and blood pressure returned to baseline Anesthetic complications: no    Last Vitals:  Vitals:   11/17/15 1120 11/17/15 1130  BP: 119/70 127/81  Pulse: 70 64  Resp: 18 12  Temp:      Last Pain:  Vitals:   11/17/15 1113  TempSrc: Oral                 [name]

## 2015-11-18 ENCOUNTER — Encounter (HOSPITAL_COMMUNITY): Payer: Self-pay | Admitting: Gastroenterology

## 2016-04-14 ENCOUNTER — Other Ambulatory Visit: Payer: Self-pay | Admitting: Family Medicine

## 2016-04-14 DIAGNOSIS — Z1231 Encounter for screening mammogram for malignant neoplasm of breast: Secondary | ICD-10-CM

## 2016-04-20 ENCOUNTER — Ambulatory Visit: Payer: Managed Care, Other (non HMO)

## 2016-05-19 ENCOUNTER — Ambulatory Visit
Admission: RE | Admit: 2016-05-19 | Discharge: 2016-05-19 | Disposition: A | Discharge status: Home / Self Care | Payer: Managed Care, Other (non HMO) | Source: Ambulatory Visit | Attending: Family Medicine | Admitting: Family Medicine

## 2016-05-19 DIAGNOSIS — Z1231 Encounter for screening mammogram for malignant neoplasm of breast: Secondary | ICD-10-CM

## 2016-06-14 ENCOUNTER — Other Ambulatory Visit: Payer: Self-pay | Admitting: Obstetrics and Gynecology

## 2016-08-10 ENCOUNTER — Ambulatory Visit (INDEPENDENT_AMBULATORY_CARE_PROVIDER_SITE_OTHER): Payer: 59 | Admitting: Family Medicine

## 2016-08-10 ENCOUNTER — Other Ambulatory Visit: Payer: Self-pay | Admitting: Family Medicine

## 2016-08-10 ENCOUNTER — Ambulatory Visit (INDEPENDENT_AMBULATORY_CARE_PROVIDER_SITE_OTHER): Payer: 59

## 2016-08-10 ENCOUNTER — Encounter: Payer: Self-pay | Admitting: Family Medicine

## 2016-08-10 VITALS — BP 138/83 | HR 67 | Resp 18 | Ht 61.42 in | Wt 196.8 lb

## 2016-08-10 DIAGNOSIS — M25561 Pain in right knee: Secondary | ICD-10-CM

## 2016-08-10 DIAGNOSIS — M1711 Unilateral primary osteoarthritis, right knee: Secondary | ICD-10-CM

## 2016-08-10 DIAGNOSIS — M722 Plantar fascial fibromatosis: Secondary | ICD-10-CM | POA: Diagnosis not present

## 2016-08-10 MED ORDER — TRAMADOL HCL 50 MG PO TABS: 50.0000 mg | ORAL_TABLET | Freq: Four times a day (QID) | ORAL | 1 refills | Status: DC | PRN

## 2016-08-10 NOTE — Patient Instructions (Addendum)
Your knee xray showed osteoarthritis and some decreased cartilage space, which is the "cushion" for your knee.    One of the better things to help with this will be physical therapy.    I'm going to have you follow up with a sports medicine doctor for both your knee and your heel pain.  They will likely see you before you start with physical therapy.    I think your heel pain is being caused by plantar fasciitis as we discussed.    Keep using the over the counter orthotics.  The sports medicine doctor can look at some better ones if necessary.   Keep taking the Tramadol and Ibuprofen for pain relief and inflammation.     Plantar Fasciitis Plantar fasciitis is a painful foot condition that affects the heel. It occurs when the band of tissue that connects the toes to the heel bone (plantar fascia) becomes irritated. This can happen after exercising too much or doing other repetitive activities (overuse injury). The pain from plantar fasciitis can range from mild irritation to severe pain that makes it difficult for you to walk or move. The pain is usually worse in the morning or after you have been sitting or lying down for a while. What are the causes? This condition may be caused by:  Standing for long periods of time.  Wearing shoes that do not fit.  Doing high-impact activities, including running, aerobics, and ballet.  Being overweight.  Having an abnormal way of walking (gait).  Having tight calf muscles.  Having high arches in your feet.  Starting a new athletic activity. What are the signs or symptoms? The main symptom of this condition is heel pain. Other symptoms include:  Pain that gets worse after activity or exercise.  Pain that is worse in the morning or after resting.  Pain that goes away after you walk for a few minutes. How is this diagnosed? This condition may be diagnosed based on your signs and symptoms. Your health care provider will also do a physical  exam to check for:  A tender area on the bottom of your foot.  A high arch in your foot.  Pain when you move your foot.  Difficulty moving your foot. You may also need to have imaging studies to confirm the diagnosis. These can include:  X-rays.  Ultrasound.  MRI. How is this treated? Treatment for plantar fasciitis depends on the severity of the condition. Your treatment may include:  Rest, ice, and over-the-counter pain medicines to manage your pain.  Exercises to stretch your calves and your plantar fascia.  A splint that holds your foot in a stretched, upward position while you sleep (night splint).  Physical therapy to relieve symptoms and prevent problems in the future.  Cortisone injections to relieve severe pain.  Extracorporeal shock wave therapy (ESWT) to stimulate damaged plantar fascia with electrical impulses. It is often used as a last resort before surgery.  Surgery, if other treatments have not worked after 12 months. Follow these instructions at home:  Take medicines only as directed by your health care provider.  Avoid activities that cause pain.  Roll the bottom of your foot over a bag of ice or a bottle of cold water. Do this for 20 minutes, 3-4 times a day.  Perform simple stretches as directed by your health care provider.  Try wearing athletic shoes with air-sole or gel-sole cushions or soft shoe inserts.  Wear a night splint while sleeping, if directed by  your health care provider.  Keep all follow-up appointments with your health care provider. How is this prevented?  Do not perform exercises or activities that cause heel pain.  Consider finding low-impact activities if you continue to have problems.  Lose weight if you need to. The best way to prevent plantar fasciitis is to avoid the activities that aggravate your plantar fascia. Contact a health care provider if:  Your symptoms do not go away after treatment with home care  measures.  Your pain gets worse.  Your pain affects your ability to move or do your daily activities. This information is not intended to replace advice given to you by your health care provider. Make sure you discuss any questions you have with your health care provider. Document Released: 12/28/2000 Document Revised: 09/07/2015 Document Reviewed: 02/12/2014 Elsevier Interactive Patient Education  2017 [employer].     IF you received an x-ray today, you will receive an invoice from [facility]. Please contact [facility] at [phone] with questions or concerns regarding your invoice.   IF you received labwork today, you will receive an invoice from [facility]. Please contact [facility] at [phone] with questions or concerns regarding your invoice.   Our billing staff will not be able to assist you with questions regarding bills from these companies.  You will be contacted with the lab results as soon as they are available. The fastest way to get your results is to activate your My Chart account. Instructions are located on the last page of this paperwork. If you have not heard from [place] regarding the results in 2 weeks, please contact this office.

## 2016-08-10 NOTE — Progress Notes (Signed)
Subjective:    Karen [name] is a 66 y.o. female who presents to PCP today for joint pains:  1.  Joint pains:  Mostly knee and heel pain, both on the right.  Also with some Right hand pain and Right shoulder pain, which is much more mild.  We spent time talking about her Knee and heel pain.    Right knee:  Pain present for about one month. She has had a new job fairly recently and stands at work most the time. She is noted increasing pain in her right knee that is worse after they have standing.  Better with rest. She's been using knee brace with some relief. She has been taking over-the-counter ibuprofen and 1 tramadol a day with some relief of her pain. She has felt that her knee is been a little bit more swollen. No redness or heat. No actual injury to her knee recently. She did have distant history of injury to right knee several years ago.  Right heel pain: Present for also about one month. Also started after she had her new job. She is on some over-the-counter inserts which provided her with some relief. As above she is taking 1 tramadol a day and some ibuprofen for this really help with her pain. She also has pain first thing in the morning. She has bought new shoes which are tennis shoes and these have helped somewhat.     ROS as above per HPI.    The following portions of the patient's history were reviewed and updated as appropriate: allergies, current medications, past medical history, family and social history, and problem list. Patient is a nonsmoker.    PMH reviewed.  Past Medical History:  Diagnosis Date  . Anemia YRS AGO  . Arthritis    JOINT PAIN HIPS AND KNEE  . Depression    NO MEDS SON DIED 10/15/15, TAKING THERAPY  . Elevated cholesterol   . GERD (gastroesophageal reflux disease)   . Hypertension   . Hypothyroidism   . Thyroid disease   . Tick bite 08/2015  . UTI (urinary tract infection)    FINISHED ANTIBIOTICS 11-11-15   Past Surgical History:   Procedure Laterality Date  . CESAREAN SECTION     X 4  . COLONOSCOPY WITH PROPOFOL N/A 11/17/2015   Procedure: COLONOSCOPY WITH PROPOFOL;  Surgeon: Garlan Fair, MD;  Location: [facility];  Service: Endoscopy;  Laterality: N/A;    Medications reviewed. Current Outpatient Prescriptions  Medication Sig Dispense Refill  . aspirin 325 MG tablet Take 325 mg by mouth daily.    Marland Kitchen atenolol (TENORMIN) 50 MG tablet Take 50 mg by mouth daily. for blood pressure  3  . cholecalciferol (VITAMIN D) 1000 UNITS tablet Take 1,000 Units by mouth daily.    . CRESTOR 5 MG tablet Take 1 tablet by mouth every morning.  2  . fluticasone (FLONASE) 50 MCG/ACT nasal spray Place 1 spray into both nostrils daily as needed for allergies or rhinitis.    Marland Kitchen levothyroxine (SYNTHROID, LEVOTHROID) 50 MCG tablet Take 1 tablet by mouth daily.  3  . traMADol (ULTRAM) 50 MG tablet Take 1 tablet by mouth See admin instructions. Up to twice daily as needed for pain  0  . triamterene-hydrochlorothiazide (MAXZIDE) 75-50 MG per tablet Take 1 tablet by mouth every morning.  3  . vitamin C (ASCORBIC ACID) 500 MG tablet Take 500 mg by mouth daily.    . cephALEXin (KEFLEX) 500 MG capsule  Take 1 capsule (500 mg total) by mouth 2 (two) times daily. (Patient not taking: Reported on 08/10/2016) 14 capsule 0  . fluconazole (DIFLUCAN) 150 MG tablet Take 1 tablet (150 mg total) by mouth once. (Patient not taking: Reported on 08/10/2016) 1 tablet 1   No current facility-administered medications for this visit.      Objective:   Physical Exam BP 138/83 (BP Location: Right Arm, Patient Position: Sitting, Cuff Size: Small)   Pulse 67   Resp 18   Ht 5' 1.42" (1.56 m)   Wt 196 lb 12.8 oz (89.3 kg)   SpO2 99%   BMI 36.68 kg/m  Gen:  Alert, cooperative patient who appears stated age in no acute distress.  Vital signs reviewed. HEENT: EOMI,  MMM Cardiac:  Regular rate and rhythm without murmur auscultated.  Good S1/S2. Pulm:  Clear to  auscultation bilaterally with good air movement.  No wheezes or rales noted.   MSK:  Hip flexor strength weaker on the Right than Left - Left knee WNL - Right knee with tenderness to media but not /lateral joint line.  Minimal effusion noted.  No surrounding redness or heat to joint.  No joint laxity noted.  Ant/post drawer negative.  McMurray's negative.  Lachmann's negative. Valgus gait - Right foot:  Pes planus, but otherwise no deformity noted.  No redness or bruising.  No ankle pain or joint laxity.  No achilles tenderness.  Essentially nontender throughout foot except throughout heel and arch of the foot.    No results found for this or any previous visit (from the past 72 hour(s)).  Imp/Plan: 1.  Right knee pain: - likely secondary to osteoarthritis.   - Standing radiographs revealed loss of joint space anteriorly - not great hip flexor strength on Right.  - PT will be helpful for this.  She is interested in steroid injection, but want to think about it first.  - Tramadol/ibuprofen for relief.  2.  Plantar fasciitis:  - likely cause of heel pain  - She is using over the counter orthotics, but I believe she will benefit from custom orthotics.   - plan to refer to sports medicine for further evaluation and recommendations.   -

## 2016-11-04 ENCOUNTER — Other Ambulatory Visit: Payer: Self-pay | Admitting: Family Medicine

## 2016-11-04 ENCOUNTER — Other Ambulatory Visit (HOSPITAL_COMMUNITY)
Admission: RE | Admit: 2016-11-04 | Discharge: 2016-11-04 | Disposition: A | Discharge status: Home / Self Care | Payer: 59 | Source: Ambulatory Visit | Attending: Family Medicine | Admitting: Family Medicine

## 2016-11-04 DIAGNOSIS — Z01419 Encounter for gynecological examination (general) (routine) without abnormal findings: Secondary | ICD-10-CM | POA: Diagnosis not present

## 2016-11-07 LAB — CYTOLOGY - PAP: Diagnosis: NEGATIVE

## 2017-05-01 ENCOUNTER — Other Ambulatory Visit: Payer: Self-pay | Admitting: Family Medicine

## 2017-05-01 DIAGNOSIS — Z1231 Encounter for screening mammogram for malignant neoplasm of breast: Secondary | ICD-10-CM

## 2017-05-23 ENCOUNTER — Ambulatory Visit
Admission: RE | Admit: 2017-05-23 | Discharge: 2017-05-23 | Disposition: A | Discharge status: Home / Self Care | Payer: 59 | Source: Ambulatory Visit | Attending: Family Medicine | Admitting: Family Medicine

## 2017-05-23 DIAGNOSIS — Z1231 Encounter for screening mammogram for malignant neoplasm of breast: Secondary | ICD-10-CM

## 2018-03-20 DIAGNOSIS — I10 Essential (primary) hypertension: Secondary | ICD-10-CM | POA: Diagnosis not present

## 2018-03-20 DIAGNOSIS — J329 Chronic sinusitis, unspecified: Secondary | ICD-10-CM | POA: Diagnosis not present

## 2018-03-20 DIAGNOSIS — F418 Other specified anxiety disorders: Secondary | ICD-10-CM | POA: Diagnosis not present

## 2018-03-20 DIAGNOSIS — R0782 Intercostal pain: Secondary | ICD-10-CM | POA: Diagnosis not present

## 2018-03-20 DIAGNOSIS — R0602 Shortness of breath: Secondary | ICD-10-CM | POA: Diagnosis not present

## 2018-03-26 ENCOUNTER — Other Ambulatory Visit: Payer: Self-pay | Admitting: Family Medicine

## 2018-03-26 DIAGNOSIS — R0602 Shortness of breath: Secondary | ICD-10-CM

## 2018-03-26 DIAGNOSIS — J329 Chronic sinusitis, unspecified: Secondary | ICD-10-CM

## 2018-03-29 ENCOUNTER — Other Ambulatory Visit: Payer: Self-pay | Admitting: Family Medicine

## 2018-03-29 DIAGNOSIS — R0600 Dyspnea, unspecified: Secondary | ICD-10-CM

## 2018-03-30 ENCOUNTER — Ambulatory Visit (INDEPENDENT_AMBULATORY_CARE_PROVIDER_SITE_OTHER): Payer: Medicare HMO | Admitting: Internal Medicine

## 2018-03-30 DIAGNOSIS — R0600 Dyspnea, unspecified: Secondary | ICD-10-CM | POA: Diagnosis not present

## 2018-03-30 LAB — PULMONARY FUNCTION TEST
DL/VA % pred: 109 %
DL/VA: 4.82 ml/min/mmHg/L
DLCO UNC % PRED: 79 %
DLCO UNC: 16.15 ml/min/mmHg
FEF 25-75 PRE: 2.06 L/s
FEF 25-75 Post: 2.09 L/sec
FEF2575-%Change-Post: 1 %
FEF2575-%PRED-POST: 124 %
FEF2575-%PRED-PRE: 123 %
FEV1-%Change-Post: 1 %
FEV1-%Pred-Post: 98 %
FEV1-%Pred-Pre: 97 %
FEV1-POST: 1.67 L
FEV1-Pre: 1.66 L
FEV1FVC-%CHANGE-POST: 1 %
FEV1FVC-%Pred-Pre: 106 %
FEV6-%CHANGE-POST: 0 %
FEV6-%PRED-POST: 93 %
FEV6-%Pred-Pre: 93 %
FEV6-Post: 1.97 L
FEV6-Pre: 1.95 L
FEV6FVC-%CHANGE-POST: 0 %
FEV6FVC-%Pred-Post: 104 %
FEV6FVC-%Pred-Pre: 104 %
FVC-%Change-Post: 0 %
FVC-%Pred-Post: 89 %
FVC-%Pred-Pre: 90 %
FVC-Post: 1.97 L
FVC-Pre: 1.98 L
POST FEV1/FVC RATIO: 85 %
POST FEV6/FVC RATIO: 100 %
PRE FEV1/FVC RATIO: 84 %
Pre FEV6/FVC Ratio: 100 %
RV % pred: 79 %
RV: 1.55 L
TLC % pred: 76 %
TLC: 3.54 L

## 2018-03-30 NOTE — Progress Notes (Signed)
PFT done today. 

## 2018-04-02 ENCOUNTER — Ambulatory Visit
Admission: RE | Admit: 2018-04-02 | Discharge: 2018-04-02 | Disposition: A | Discharge status: Home / Self Care | Payer: Medicare HMO | Source: Ambulatory Visit | Attending: Family Medicine | Admitting: Family Medicine

## 2018-04-02 DIAGNOSIS — R0602 Shortness of breath: Secondary | ICD-10-CM

## 2018-04-02 DIAGNOSIS — I77810 Thoracic aortic ectasia: Secondary | ICD-10-CM | POA: Diagnosis not present

## 2018-04-02 DIAGNOSIS — J329 Chronic sinusitis, unspecified: Secondary | ICD-10-CM

## 2018-04-02 DIAGNOSIS — Z8709 Personal history of other diseases of the respiratory system: Secondary | ICD-10-CM | POA: Diagnosis not present

## 2018-04-02 MED ORDER — IOPAMIDOL (ISOVUE-300) INJECTION 61%
75.0000 mL | Freq: Once | INTRAVENOUS | Status: AC | PRN
  Administered 2018-04-02: 75 mL via INTRAVENOUS

## 2018-05-10 ENCOUNTER — Other Ambulatory Visit: Payer: Self-pay | Admitting: Family Medicine

## 2018-05-10 DIAGNOSIS — Z1231 Encounter for screening mammogram for malignant neoplasm of breast: Secondary | ICD-10-CM

## 2018-06-04 ENCOUNTER — Other Ambulatory Visit: Payer: Self-pay | Admitting: Family Medicine

## 2018-06-04 ENCOUNTER — Ambulatory Visit
Admission: RE | Admit: 2018-06-04 | Discharge: 2018-06-04 | Disposition: A | Discharge status: Home / Self Care | Payer: Medicare HMO | Source: Ambulatory Visit | Attending: Family Medicine | Admitting: Family Medicine

## 2018-06-04 DIAGNOSIS — Z1231 Encounter for screening mammogram for malignant neoplasm of breast: Secondary | ICD-10-CM

## 2018-06-14 DIAGNOSIS — H5213 Myopia, bilateral: Secondary | ICD-10-CM | POA: Diagnosis not present

## 2018-09-26 ENCOUNTER — Other Ambulatory Visit: Payer: Self-pay | Admitting: Family Medicine

## 2018-09-26 DIAGNOSIS — R9389 Abnormal findings on diagnostic imaging of other specified body structures: Secondary | ICD-10-CM

## 2018-10-03 ENCOUNTER — Other Ambulatory Visit: Payer: Self-pay

## 2018-10-03 ENCOUNTER — Ambulatory Visit
Admission: RE | Admit: 2018-10-03 | Discharge: 2018-10-03 | Disposition: A | Discharge status: Home / Self Care | Payer: Medicare HMO | Source: Ambulatory Visit | Attending: Family Medicine | Admitting: Family Medicine

## 2018-10-03 DIAGNOSIS — R9389 Abnormal findings on diagnostic imaging of other specified body structures: Secondary | ICD-10-CM

## 2018-10-03 DIAGNOSIS — R918 Other nonspecific abnormal finding of lung field: Secondary | ICD-10-CM | POA: Diagnosis not present

## 2018-12-14 DIAGNOSIS — E7800 Pure hypercholesterolemia, unspecified: Secondary | ICD-10-CM | POA: Diagnosis not present

## 2018-12-14 DIAGNOSIS — E039 Hypothyroidism, unspecified: Secondary | ICD-10-CM | POA: Diagnosis not present

## 2018-12-14 DIAGNOSIS — Z1331 Encounter for screening for depression: Secondary | ICD-10-CM | POA: Diagnosis not present

## 2018-12-14 DIAGNOSIS — F411 Generalized anxiety disorder: Secondary | ICD-10-CM | POA: Diagnosis not present

## 2018-12-14 DIAGNOSIS — Z800 Family history of malignant neoplasm of digestive organs: Secondary | ICD-10-CM | POA: Diagnosis not present

## 2018-12-14 DIAGNOSIS — N898 Other specified noninflammatory disorders of vagina: Secondary | ICD-10-CM | POA: Diagnosis not present

## 2018-12-14 DIAGNOSIS — J320 Chronic maxillary sinusitis: Secondary | ICD-10-CM | POA: Diagnosis not present

## 2018-12-14 DIAGNOSIS — I10 Essential (primary) hypertension: Secondary | ICD-10-CM | POA: Diagnosis not present

## 2018-12-14 DIAGNOSIS — Z0000 Encounter for general adult medical examination without abnormal findings: Secondary | ICD-10-CM | POA: Diagnosis not present

## 2019-01-11 DIAGNOSIS — R232 Flushing: Secondary | ICD-10-CM | POA: Diagnosis not present

## 2019-01-11 DIAGNOSIS — R6882 Decreased libido: Secondary | ICD-10-CM | POA: Diagnosis not present

## 2019-01-29 ENCOUNTER — Other Ambulatory Visit: Payer: Self-pay

## 2019-01-29 DIAGNOSIS — Z20828 Contact with and (suspected) exposure to other viral communicable diseases: Secondary | ICD-10-CM | POA: Diagnosis not present

## 2019-01-29 DIAGNOSIS — Z20822 Contact with and (suspected) exposure to covid-19: Secondary | ICD-10-CM

## 2019-01-31 LAB — NOVEL CORONAVIRUS, NAA: SARS-CoV-2, NAA: NOT DETECTED

## 2019-02-08 DIAGNOSIS — F488 Other specified nonpsychotic mental disorders: Secondary | ICD-10-CM | POA: Diagnosis not present

## 2019-02-08 DIAGNOSIS — F411 Generalized anxiety disorder: Secondary | ICD-10-CM | POA: Diagnosis not present

## 2019-02-08 DIAGNOSIS — E782 Mixed hyperlipidemia: Secondary | ICD-10-CM | POA: Diagnosis not present

## 2019-02-08 DIAGNOSIS — Z800 Family history of malignant neoplasm of digestive organs: Secondary | ICD-10-CM | POA: Diagnosis not present

## 2019-02-08 DIAGNOSIS — R350 Frequency of micturition: Secondary | ICD-10-CM | POA: Diagnosis not present

## 2019-02-08 DIAGNOSIS — E669 Obesity, unspecified: Secondary | ICD-10-CM | POA: Diagnosis not present

## 2019-02-08 DIAGNOSIS — Z9109 Other allergy status, other than to drugs and biological substances: Secondary | ICD-10-CM | POA: Diagnosis not present

## 2019-02-22 ENCOUNTER — Telehealth: Payer: Self-pay | Admitting: Licensed Clinical Social Worker

## 2019-02-22 NOTE — Telephone Encounter (Signed)
Called patient regarding upcoming [facility] appointment, per patient's request this will be a walk-in visit. °

## 2019-02-25 ENCOUNTER — Telehealth: Payer: Self-pay | Admitting: Licensed Clinical Social Worker

## 2019-02-25 ENCOUNTER — Inpatient Hospital Stay: Payer: Medicare HMO | Attending: Genetic Counselor | Admitting: Licensed Clinical Social Worker

## 2019-02-25 ENCOUNTER — Other Ambulatory Visit: Payer: Self-pay | Admitting: Licensed Clinical Social Worker

## 2019-02-25 ENCOUNTER — Other Ambulatory Visit: Payer: Medicare HMO

## 2019-02-25 DIAGNOSIS — Z800 Family history of malignant neoplasm of digestive organs: Secondary | ICD-10-CM

## 2019-02-25 NOTE — Telephone Encounter (Signed)
Returned patient's phone call regarding rescheduling an appointment, left a voicemail. 

## 2019-02-27 ENCOUNTER — Encounter: Payer: Self-pay | Admitting: Licensed Clinical Social Worker

## 2019-03-20 DIAGNOSIS — H9311 Tinnitus, right ear: Secondary | ICD-10-CM | POA: Diagnosis not present

## 2019-03-20 DIAGNOSIS — Z23 Encounter for immunization: Secondary | ICD-10-CM | POA: Diagnosis not present

## 2019-03-26 DIAGNOSIS — H9313 Tinnitus, bilateral: Secondary | ICD-10-CM

## 2019-03-26 DIAGNOSIS — H9311 Tinnitus, right ear: Secondary | ICD-10-CM | POA: Diagnosis not present

## 2019-03-26 DIAGNOSIS — H903 Sensorineural hearing loss, bilateral: Secondary | ICD-10-CM | POA: Insufficient documentation

## 2019-03-26 DIAGNOSIS — F411 Generalized anxiety disorder: Secondary | ICD-10-CM

## 2019-03-26 DIAGNOSIS — F419 Anxiety disorder, unspecified: Secondary | ICD-10-CM | POA: Diagnosis not present

## 2019-03-26 HISTORY — DX: Sensorineural hearing loss, bilateral: H90.3

## 2019-03-26 HISTORY — DX: Generalized anxiety disorder: F41.1

## 2019-03-26 HISTORY — DX: Tinnitus, bilateral: H93.13

## 2019-04-16 DIAGNOSIS — J0110 Acute frontal sinusitis, unspecified: Secondary | ICD-10-CM | POA: Diagnosis not present

## 2019-05-13 ENCOUNTER — Ambulatory Visit: Payer: Medicare HMO | Attending: Internal Medicine

## 2019-05-13 DIAGNOSIS — Z20822 Contact with and (suspected) exposure to covid-19: Secondary | ICD-10-CM

## 2019-05-14 LAB — NOVEL CORONAVIRUS, NAA: SARS-CoV-2, NAA: NOT DETECTED

## 2019-05-31 DIAGNOSIS — F411 Generalized anxiety disorder: Secondary | ICD-10-CM | POA: Diagnosis not present

## 2019-05-31 DIAGNOSIS — Z809 Family history of malignant neoplasm, unspecified: Secondary | ICD-10-CM | POA: Diagnosis not present

## 2019-05-31 DIAGNOSIS — M2550 Pain in unspecified joint: Secondary | ICD-10-CM | POA: Diagnosis not present

## 2019-05-31 DIAGNOSIS — H9313 Tinnitus, bilateral: Secondary | ICD-10-CM | POA: Diagnosis not present

## 2019-05-31 DIAGNOSIS — R232 Flushing: Secondary | ICD-10-CM | POA: Diagnosis not present

## 2019-06-05 ENCOUNTER — Telehealth: Payer: Self-pay | Admitting: Genetic Counselor

## 2019-06-05 NOTE — Telephone Encounter (Signed)
Received a genetic counseling referral from Dr. Sheryn Bison for fhx of cancer. Pt has been cld and scheduled to see Raquel Sarna on 2/23 at 9am. Pt doesn't have access to a video camera. She's been made aware to arrive 15 minutes early.

## 2019-06-06 ENCOUNTER — Other Ambulatory Visit: Payer: Self-pay | Admitting: Family Medicine

## 2019-06-11 ENCOUNTER — Encounter: Payer: Self-pay | Admitting: Genetic Counselor

## 2019-06-11 ENCOUNTER — Other Ambulatory Visit: Payer: Self-pay

## 2019-06-11 ENCOUNTER — Inpatient Hospital Stay: Payer: Medicare HMO | Attending: Genetic Counselor | Admitting: Genetic Counselor

## 2019-06-11 ENCOUNTER — Inpatient Hospital Stay: Payer: Medicare HMO

## 2019-06-11 DIAGNOSIS — Z801 Family history of malignant neoplasm of trachea, bronchus and lung: Secondary | ICD-10-CM

## 2019-06-11 DIAGNOSIS — Z800 Family history of malignant neoplasm of digestive organs: Secondary | ICD-10-CM

## 2019-06-11 DIAGNOSIS — Z8041 Family history of malignant neoplasm of ovary: Secondary | ICD-10-CM | POA: Diagnosis not present

## 2019-06-11 DIAGNOSIS — Z803 Family history of malignant neoplasm of breast: Secondary | ICD-10-CM | POA: Insufficient documentation

## 2019-06-11 NOTE — Progress Notes (Signed)
REFERRING PROVIDER: Aura Dials, [place],  [place] [address]  PRIMARY PROVIDER:  Aura Dials, MD  PRIMARY REASON FOR VISIT:  1. Family history of pancreatic cancer   2. Family history of ovarian cancer   3. Family history of breast cancer   4. Family history of lung cancer   5. Family history of throat cancer     HISTORY OF PRESENT ILLNESS:   Ms. Bloodgood, a 69 y.o. female, was seen for a [place] cancer genetics consultation at the request of Dr. Sheryn Bison due to a family history of cancer.  Ms. Hascall presents to clinic today to discuss the possibility of a hereditary predisposition to cancer, genetic testing, and to further clarify her future cancer risks, as well as potential cancer risks for family members.   Ms. Eyster is a 69 y.o. female with no personal history of cancer.   CANCER HISTORY:  Oncology History   No history exists.     RISK FACTORS:  Menarche was at age 67.  First live birth at age 72.  OCP use for approximately 2 years.  Ovaries intact: yes.  Hysterectomy: yes, 66 (age [age]).  Menopausal status: postmenopausal.  HRT use: 0 years. Colonoscopy: yes; 2017, 3 polyps per patient. Mammogram within the last year: yes. Number of breast biopsies: 0. Any excessive radiation exposure in the past: no  Past Medical History:  Diagnosis Date  . Anemia YRS AGO  . Arthritis    JOINT PAIN HIPS AND KNEE  . Depression    NO MEDS SON DIED Oct 19, 2015, TAKING THERAPY  . Elevated cholesterol   . Family history of breast cancer   . Family history of lung cancer   . Family history of ovarian cancer   . Family history of pancreatic cancer   . Family history of throat cancer   . GERD (gastroesophageal reflux disease)   . Hypertension   . Hypothyroidism   . Thyroid disease   . Tick bite 08/2015  . UTI (urinary tract infection)    FINISHED ANTIBIOTICS 11-11-15    Past Surgical History:  Procedure Laterality Date  . CESAREAN  SECTION     X 4  . COLONOSCOPY WITH PROPOFOL N/A 11/17/2015   Procedure: COLONOSCOPY WITH PROPOFOL;  Surgeon: Garlan Fair, MD;  Location: [facility];  Service: Endoscopy;  Laterality: N/A;    Social History   Socioeconomic History  . Marital status: Married    Spouse name: Not on file  . Number of children: Not on file  . Years of education: Not on file  . Highest education level: Not on file  Occupational History  . Not on file  Tobacco Use  . Smoking status: Former Smoker    Years: 10.00    Types: Cigarettes    Quit date: 04/18/2000    Years since quitting: 19.1  . Smokeless tobacco: Never Used  . Tobacco comment: LIGHT SMOKER  Substance and Sexual Activity  . Alcohol use: No  . Drug use: Yes    Types: Marijuana    Comment: MARIJUANA  USE YEARS AGO IN COLLEGE  . Sexual activity: Not on file  Other Topics Concern  . Not on file  Social History Narrative  . Not on file   Social Determinants of Health   Financial Resource Strain:   . Difficulty of Paying Living Expenses: Not on file  Food Insecurity:   . Worried About Charity fundraiser in the Last Year:  Not on file  . Ran Out of Food in the Last Year: Not on file  Transportation Needs:   . Lack of Transportation (Medical): Not on file  . Lack of Transportation (Non-Medical): Not on file  Physical Activity:   . Days of Exercise per Week: Not on file  . Minutes of Exercise per Session: Not on file  Stress:   . Feeling of Stress : Not on file  Social Connections:   . Frequency of Communication with Friends and Family: Not on file  . Frequency of Social Gatherings with Friends and Family: Not on file  . Attends Religious Services: Not on file  . Active Member of Clubs or Organizations: Not on file  . Attends Archivist Meetings: Not on file  . Marital Status: Not on file     FAMILY HISTORY:  We obtained a detailed, 4-generation family history.  Significant diagnoses are listed below: Family  History  Problem Relation Age of Onset  . Hypertension Mother   . Pancreatic cancer Son 43  . Ovarian cancer Maternal Grandmother 83  . Pancreatic cancer Half-Brother 73       non-smoker  . Lung cancer Maternal Aunt 72       non-smoker  . Throat cancer Maternal Aunt        dx. 70s, non-smoker  . Breast cancer Cousin 64   Ms. Gibeault has four children - two daughters and two sons. One of her sons died when he was 36 from pancreatic cancer. She notes that he had a short history of smoking when he was younger, but he had quit. Ms. Scheunemann does not have any full-siblings, but she does have a half-brother and two half-sisters with the same mother. Her half-brother died at age 30 from pancreatic cancer, and he did not have a history of smoking.   Ms. Iturralde mother is currently living at age 3. Her mother has had lung nodules and growths on her brain on imaging, but has not yet received a diagnosis. Ms. Belitz had one maternal uncle and four maternal aunts, all of whom are deceased. One of her aunts had lung cancer diagnosed at age 72, and another aunt had throat cancer diagnosed in her early 75s. Neither of these aunts were smokers. Ms. Lightsey has a maternal cousin who died from breast cancer when she was 102. Her maternal grandmother died from ovarian cancer at age 78, and her maternal grandfather died at age 59 and did not have cancer.   Ms. Pondexter does not have any information about her father or his family history.   Ms. Brinker is unaware of previous family history of genetic testing for hereditary cancer risks. Her maternal ancestors are of 7 American and Native American descent. She is unsure of her paternal ancestry. There is no reported Ashkenazi Jewish ancestry. There is no known consanguinity.  GENETIC COUNSELING ASSESSMENT: Ms. Ek is a 69 y.o. female with a family history of pancreatic cancer, ovarian cancer, and breast cancer, which is somewhat suggestive of  a hereditary cancer syndrome and predisposition to cancer. We, therefore, discussed and recommended the following at today's visit.   DISCUSSION: We discussed that, while most cancer is sporadic, approximately 5-10% of cancer is hereditary. Most cases of hereditary pancreatic cancer are associated with the BRCA2 gene, although there are other genes that can be associated with hereditary pancreatic cancer syndromes (e.g. CDKN2A, APC, ATM, etc.).  We discussed that testing is beneficial for several reasons including knowing about other  cancer risks, identifying potential screening and risk-reduction options that may be appropriate, and to understand if other family members could be at risk for cancer and allow them to undergo genetic testing.  We reviewed the characteristics, features and inheritance patterns of hereditary cancer syndromes. We also discussed genetic testing, including the appropriate family members to test, the process of testing, insurance coverage and turn-around-time for results. We discussed the implications of a negative, positive and/or variant of uncertain significant result. We recommended Ms. Mawhinney pursue genetic testing for the [employer].  The Multi-Cancer Panel offered by [facility] includes sequencing and/or deletion duplication testing of the following 85 genes: AIP, ALK, APC, ATM, AXIN2,BAP1,  BARD1, BLM, BMPR1A, BRCA1, BRCA2, BRIP1, CASR, CDC73, CDH1, CDK4, CDKN1B, CDKN1C, CDKN2A (p14ARF), CDKN2A (p16INK4a), CEBPA, CHEK2, CTNNA1, DICER1, DIS3L2, EGFR (c.2369C>T, p.Thr790Met variant only), EPCAM (Deletion/duplication testing only), FH, FLCN, GATA2, GPC3, GREM1 (Promoter region deletion/duplication testing only), HOXB13 (c.251G>A, p.Gly84Glu), HRAS, KIT, MAX, MEN1, MET, MITF (c.952G>A, p.Glu318Lys variant only), MLH1, MSH2, MSH3, MSH6, MUTYH, NBN, NF1, NF2, NTHL1, PALB2, PDGFRA, PHOX2B, PMS2, POLD1, POLE, POT1, PRKAR1A, PTCH1, PTEN, RAD50, RAD51C, RAD51D, RB1,  RECQL4, RET, RNF43, RUNX1, SDHAF2, SDHA (sequence changes only), SDHB, SDHC, SDHD, SMAD4, SMARCA4, SMARCB1, SMARCE1, STK11, SUFU, TERC, TERT, TMEM127, TP53, TSC1, TSC2, VHL, WRN and WT1.     Based on Ms. Voorhees's family history of cancer, she meets medical criteria for genetic testing. Despite that she meets criteria, she may still have an out of pocket cost. We discussed that if her out of pocket cost for testing is over $100, the laboratory will call and confirm whether she wants to proceed with testing.  If the out of pocket cost of testing is less than $100 she will be billed by the genetic testing laboratory.   PLAN: After considering the risks, benefits, and limitations, Ms. Pfefferle provided informed consent to pursue genetic testing and the blood sample was sent to [facility] for analysis of the Multi-Cancer apnel. Results should be available within approximately two-three weeks' time, at which point they will be disclosed by telephone to Ms. Harcum, as will any additional recommendations warranted by these results. Ms. Broussard will receive a summary of her genetic counseling visit and a copy of her results once available. This information will also be available in Epic.   Ms. Speir questions were answered to her satisfaction today. Our contact information was provided should additional questions or concerns arise. Thank you for the referral and allowing [place] to share in the care of your patient.   Clint Guy, MS, [facility] Genetic Counselor [email or web address] Phone: [phone]  The patient was seen for a total of 60 minutes in face-to-face genetic counseling.  This patient was discussed with Drs. Magrinat, Lindi Adie and/or Burr Medico who agrees with the above.    _______________________________________________________________________ For Office Staff:  Number of people involved in session: 1 Was an Intern/ student involved with case: no

## 2019-06-14 ENCOUNTER — Other Ambulatory Visit: Payer: Self-pay | Admitting: Family Medicine

## 2019-06-14 DIAGNOSIS — H9313 Tinnitus, bilateral: Secondary | ICD-10-CM

## 2019-06-24 DIAGNOSIS — Z1379 Encounter for other screening for genetic and chromosomal anomalies: Secondary | ICD-10-CM

## 2019-06-24 HISTORY — DX: Encounter for other screening for genetic and chromosomal anomalies: Z13.79

## 2019-06-25 ENCOUNTER — Inpatient Hospital Stay: Admission: RE | Admit: 2019-06-25 | Payer: Medicare HMO | Source: Ambulatory Visit

## 2019-07-01 ENCOUNTER — Ambulatory Visit: Payer: Self-pay | Admitting: Genetic Counselor

## 2019-07-01 ENCOUNTER — Encounter: Payer: Self-pay | Admitting: Genetic Counselor

## 2019-07-01 ENCOUNTER — Telehealth: Payer: Self-pay | Admitting: Genetic Counselor

## 2019-07-01 DIAGNOSIS — Z1379 Encounter for other screening for genetic and chromosomal anomalies: Secondary | ICD-10-CM

## 2019-07-01 DIAGNOSIS — Z800 Family history of malignant neoplasm of digestive organs: Secondary | ICD-10-CM

## 2019-07-01 NOTE — Progress Notes (Signed)
HPI:  Karen [name] was previously seen in the [place] clinic due to a family history of cancer and concerns regarding a hereditary predisposition to cancer. Please refer to our prior cancer genetics clinic note for more information regarding our discussion, assessment and recommendations, at the time. Karen [name] recent genetic test results were disclosed to her, as were recommendations warranted by these results. These results and recommendations are discussed in more detail below.  CANCER HISTORY:  Oncology History   No history exists.    FAMILY HISTORY:  We obtained a detailed, 4-generation family history.  Significant diagnoses are listed below: Family History  Problem Relation Age of Onset  . Hypertension Mother   . Pancreatic cancer Son 70  . Ovarian cancer Maternal Grandmother 5  . Pancreatic cancer Half-Brother 19       non-smoker  . Lung cancer Maternal Aunt 72       non-smoker  . Throat cancer Maternal Aunt        dx. 70s, non-smoker  . Breast cancer Cousin 61   Ms. Ebey has four children - two daughters and two sons. One of her sons died when he was 33 from pancreatic cancer. She notes that he had a short history of smoking when he was younger, but he had quit. Ms. Bills does not have any full-siblings, but she does have a half-brother and two half-sisters with the same mother. Her half-brother died at age 33 from pancreatic cancer, and he did not have a history of smoking.   Ms. Grieves mother is currently living at age 37. Her mother has had lung nodules and growths on her brain on imaging, but has not yet received a diagnosis. Ms. Hauck had one maternal uncle and four maternal aunts, all of whom are deceased. One of her aunts had lung cancer diagnosed at age 31, and another aunt had throat cancer diagnosed in her early 60s. Neither of these aunts were smokers. Ms. Jungwirth has a maternal cousin who died from breast cancer when she was 75.  Her maternal grandmother died from ovarian cancer at age 11, and her maternal grandfather died at age 83 and did not have cancer.   Ms. Okelly does not have any information about her father or his family history.   Ms. Gudiel is unaware of previous family history of genetic testing for hereditary cancer risks. Her maternal ancestors are of 7 American and Native American descent. She is unsure of her paternal ancestry. There is no reported Ashkenazi Jewish ancestry. There is no known consanguinity.  GENETIC TEST RESULTS: Genetic testing reported out on 06/24/2019 through the [facility] Multi-Cancer panel. No pathogenic variants were detected.   The Multi-Cancer Panel offered by [facility] includes sequencing and/or deletion duplication testing of the following 85 genes: AIP, ALK, APC, ATM, AXIN2,BAP1,  BARD1, BLM, BMPR1A, BRCA1, BRCA2, BRIP1, CASR, CDC73, CDH1, CDK4, CDKN1B, CDKN1C, CDKN2A (p14ARF), CDKN2A (p16INK4a), CEBPA, CHEK2, CTNNA1, DICER1, DIS3L2, EGFR (c.2369C>T, p.Thr790Met variant only), EPCAM (Deletion/duplication testing only), FH, FLCN, GATA2, GPC3, GREM1 (Promoter region deletion/duplication testing only), HOXB13 (c.251G>A, p.Gly84Glu), HRAS, KIT, MAX, MEN1, MET, MITF (c.952G>A, p.Glu318Lys variant only), MLH1, MSH2, MSH3, MSH6, MUTYH, NBN, NF1, NF2, NTHL1, PALB2, PDGFRA, PHOX2B, PMS2, POLD1, POLE, POT1, PRKAR1A, PTCH1, PTEN, RAD50, RAD51C, RAD51D, RB1, RECQL4, RET, RNF43, RUNX1, SDHAF2, SDHA (sequence changes only), SDHB, SDHC, SDHD, SMAD4, SMARCA4, SMARCB1, SMARCE1, STK11, SUFU, TERC, TERT, TMEM127, TP53, TSC1, TSC2, VHL, WRN and WT1. The test report will be scanned into EPIC and located under the  Molecular Pathology section of the Results Review tab.  A portion of the result report is included below for reference.     We discussed with Karen [name] that because current genetic testing is not perfect, it is possible there may be a gene mutation in one of these genes that current  testing cannot detect, but that chance is small.  We also discussed, that there could be another gene that has not yet been discovered, or that we have not yet tested, that is responsible for the cancer diagnoses in the family. It is also possible there is a hereditary cause for the cancer in the family that Karen [name] did not inherit and therefore was not identified in her testing.  Therefore, it is important to remain in touch with cancer genetics in the future so that we can continue to offer Karen [name] the most up to date genetic testing.   Genetic testing did identify three variants of uncertain significance (VUS) - one in the AXIN2 gene called c.1829G>A, a second in the PMS2 gene called c.964G>A, and a third in the RNF43 gene called c.1114C>T.  At this time, it is unknown if these variants are associated with increased cancer risk or if they are normal findings, but most variants such as these get reclassified to being inconsequential. They should not be used to make medical management decisions. With time, we suspect the lab will determine the significance of these variants, if any. If we do learn more about them, we will try to contact Karen [name] to discuss it further. However, it is important to stay in touch with [place] periodically and keep the address and phone number up to date.  CANCER SCREENING RECOMMENDATIONS: Karen [name] test result is considered negative (normal). This means that we have not identified a hereditary cause for her family history of cancer at this time. While reassuring, we must interpret these negative results with some caution given Karen [name]'s family history. Families with features suggestive of hereditary risk for cancer tend to have multiple family members with cancer, diagnoses in multiple generations and diagnoses before the age of 65. Karen [name] family exhibits some of these features. Thus, this result may simply reflect our current inability to detect all  mutations within these genes or there may be a different gene that has not yet been discovered or tested.   An individual's cancer risk and medical management are not determined by genetic test results alone. Overall cancer risk assessment incorporates additional factors, including personal medical history, family history, and any available genetic information that may result in a personalized plan for cancer prevention and surveillance.  Karen [name] has a history of pancreatic cancer in one first degree relative (her son) and one second degree relative (her half-brother). Per the International Cancer of the Pancreas Screening (CAPS) Consortium, individuals with a family history of pancreatic cancer in at least one first degree relative and one second degree relative should consider annual pancreatic cancer screening. We therefore recommended that Karen [name] consider pancreatic cancer screening - she is highly interested. Karen [name] does not have a GI specialist with whom she is currently established. Therefore, we will request that Karen [name] be seen by Dr. Bryan Lemma at [facility] for follow up and discussion of appropriate screening options given her family history of pancreatic cancer.   Based on Karen [name]'s personal and family history, as well as her genetic test results, statistical models (Tyrer-Cuzick and the [employer]) were used to estimate her risk  of developing breast cancer. Tyrer-Cuzick estimates her lifetime risk of developing breast cancer to be approximately 4.4%. This lifetime breast cancer risk is a preliminary estimate based on available information using one of several models endorsed by the [place] (ACS). The ACS recommends consideration of breast MRI screening as an adjunct to mammography for patients at high risk (defined as 20% or greater lifetime risk). A more detailed breast cancer risk assessment can be considered, if clinically indicated.  The Baker Janus model  estimates her 5-year risk for breast cancer to be approximately 1.3%. Consideration for chemoprevention is recommended for women who have a 5-year breast cancer risk of 1.7% or greater.  Tyrer Lowella Petties:   The Gail Model:    RECOMMENDATIONS FOR FAMILY MEMBERS:  Individuals in this family might be at some increased risk of developing cancer, over the general population risk, simply due to the family history of cancer.  We recommended women in this family have a yearly mammogram beginning at age 61, or 8 years younger than the earliest onset of cancer, an annual clinical breast exam, and perform monthly breast self-exams. Women in this family should also have a gynecological exam as recommended by their primary provider. All family members should have a colonoscopy by age 73.  It is also possible there is a hereditary cause for the cancer in Karen [name]'s family that she did not inherit and therefore was not identified in her.  Based on Karen [name]'s family history, we recommended first degree relatives of her son, who was diagnosed with pancreatic cancer at age 35, and first degree relatives of her half-brother, who was also diagnosed with pancreatic cancer at age 20, have genetic counseling and testing. Karen [name] will let us know if we can be of any assistance in coordinating genetic counseling and/or testing for these family members.   FOLLOW-UP: Lastly, we discussed with Karen [name] that cancer genetics is a rapidly advancing field and it is possible that new genetic tests will be appropriate for her and/or her family members in the future. We encouraged her to remain in contact with cancer genetics on an annual basis so we can update her personal and family histories and let her know of advances in cancer genetics that may benefit this family.   Our contact number was provided. Karen [name] questions were answered to her satisfaction, and she knows she is welcome to call us at anytime with  additional questions or concerns.   Clint Guy, MS, [facility] Genetic Counselor [email or web address] Phone: [phone]

## 2019-07-01 NOTE — Telephone Encounter (Signed)
Revealed negative genetic testing.  Discussed that we do not know why there is cancer in the family.  It could be due to a different gene that we are not testing, or our current technology may not be able detect certain mutations.  It is also possible that there could be a hereditary cause of the cancer in the family that Karen [name] did not inherit.  It will be important for her to keep in contact with genetics to keep up with whether additional testing may be appropriate in the future.   We also discussed the option for a referral to a GI specialist to consider pancreatic cancer screening. Karen [name] is highly interested in this referral and the prospect of pancreatic cancer screening.  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 Her results are still considered normal, and these VUSes should not impact her medical management.

## 2019-07-01 NOTE — Telephone Encounter (Signed)
LVM that her genetic test results are available and requested that she call back to discuss them.  

## 2019-07-12 ENCOUNTER — Telehealth: Payer: Self-pay

## 2019-07-12 NOTE — Telephone Encounter (Signed)
-----   Message from [place], DO sent at 07/12/2019  3:57 PM EDT ----- Regarding: FW: Pancreatic Cancer Screening - Family Hx Have we already contacted this patient? If not, can you please set up an appt with me? Thanks. ----- Message ----- From: Clint Guy Sent: [phone]   4:59 PM EDT To: Lavena Bullion, DO Subject: Pancreatic Cancer Screening - Family Hx        Hi Dr. Bryan Lemma,  This patient has a family history of young onset pancreatic cancer in her son and her half-brother (age 39 at diagnosis for both). Her half-brother was a non-smoker, and her son had a history of smoking for only a short period of time. Her genetic testing was normal, but we discussed pancreatic cancer screening based on her family history (per the International CAPS recommendations) and she was very highly interested in discussing this further.   Would you be able to have your office schedule a consultation with her? She had also mentioned to me that she is experiencing other upper GI symptoms (a feeling of being inflamed) that she may want to discuss as well.   Thank you, Raquel Sarna

## 2019-07-12 NOTE — Telephone Encounter (Signed)
Patient has an appointment with Dr Jonah Blue to discuss pancreatic cancer screening. Family history of pancreatic cancer. She would also like to discuss upper GI  Symptoms.

## 2019-07-16 ENCOUNTER — Encounter: Payer: Self-pay | Admitting: Gastroenterology

## 2019-07-18 ENCOUNTER — Ambulatory Visit
Admission: RE | Admit: 2019-07-18 | Discharge: 2019-07-18 | Disposition: A | Discharge status: Home / Self Care | Payer: Medicare HMO | Source: Ambulatory Visit | Attending: Family Medicine | Admitting: Family Medicine

## 2019-07-18 ENCOUNTER — Other Ambulatory Visit: Payer: Self-pay

## 2019-07-18 ENCOUNTER — Other Ambulatory Visit: Payer: Self-pay | Admitting: Family Medicine

## 2019-07-18 DIAGNOSIS — H9313 Tinnitus, bilateral: Secondary | ICD-10-CM

## 2019-08-06 DIAGNOSIS — J358 Other chronic diseases of tonsils and adenoids: Secondary | ICD-10-CM | POA: Diagnosis not present

## 2019-08-06 DIAGNOSIS — M2550 Pain in unspecified joint: Secondary | ICD-10-CM | POA: Diagnosis not present

## 2019-08-06 DIAGNOSIS — F411 Generalized anxiety disorder: Secondary | ICD-10-CM | POA: Diagnosis not present

## 2019-08-06 DIAGNOSIS — H9313 Tinnitus, bilateral: Secondary | ICD-10-CM | POA: Diagnosis not present

## 2019-08-06 DIAGNOSIS — Z800 Family history of malignant neoplasm of digestive organs: Secondary | ICD-10-CM | POA: Diagnosis not present

## 2019-08-08 ENCOUNTER — Ambulatory Visit (INDEPENDENT_AMBULATORY_CARE_PROVIDER_SITE_OTHER): Payer: Medicare HMO | Admitting: Gastroenterology

## 2019-08-08 ENCOUNTER — Other Ambulatory Visit: Payer: Self-pay

## 2019-08-08 ENCOUNTER — Encounter: Payer: Self-pay | Admitting: Gastroenterology

## 2019-08-08 VITALS — BP 124/78 | HR 70 | Temp 96.9°F | Ht 61.0 in | Wt 182.0 lb

## 2019-08-08 DIAGNOSIS — Z860100 Personal history of colon polyps, unspecified: Secondary | ICD-10-CM

## 2019-08-08 DIAGNOSIS — K5900 Constipation, unspecified: Secondary | ICD-10-CM | POA: Diagnosis not present

## 2019-08-08 DIAGNOSIS — Z800 Family history of malignant neoplasm of digestive organs: Secondary | ICD-10-CM

## 2019-08-08 DIAGNOSIS — R194 Change in bowel habit: Secondary | ICD-10-CM | POA: Diagnosis not present

## 2019-08-08 DIAGNOSIS — Z01818 Encounter for other preprocedural examination: Secondary | ICD-10-CM | POA: Diagnosis not present

## 2019-08-08 DIAGNOSIS — R1310 Dysphagia, unspecified: Secondary | ICD-10-CM | POA: Diagnosis not present

## 2019-08-08 MED ORDER — CLENPIQ 10-3.5-12 MG-GM -GM/160ML PO SOLN: 1.0000 | Freq: Once | ORAL | 0 refills | Status: AC

## 2019-08-08 NOTE — Progress Notes (Signed)
Chief Complaint: Family history of Pancreatic Cancer, Pancreatic Cancer screening  Referring Provider:     Clint Guy   HPI:     Karen [name] is a 69 y.o. female referred to the [facility] for evaluation of Pancreatic Cancer screening due to family history of Pancreatic Cancer as outlined below.  She otherwise has no personal history of pancreatitis or hepatobiliary disease.  No history of jaundice, icteric sclera, ascites.  Family history: -Son: Pancreatic cancer (87) -Maternal grandmother: Ovarian cancer (88) -Mother: Currently living, age 43 -Half-brother (same mother): Pancreatic cancer (23) -Maternal aunt: Lung cancer (93) -Maternal aunt: Throat cancer  -Cousin: Breast cancer (58)  Genetic test results: -06/24/2019: No pathogenic variants detected.  3 variants of uncertain significance (VUS) identified on AXIN2 gene, PMS2 gene, and RNF43 gene  Separately, she has increased throat clearing since Aug 2020. No HB, regurgitation. Also with intermittent dysphagia, pointing at anterior neck. No impaction. No odynophagia. No prior EGD.  Has not trialed medications for this.  Also with constipation, having to take OTC stool softener now. Sxs progressive over last several months.  No hematochezia or melena.  Has had 18 pound weight loss over the last year, but in the setting of dietary modifications and increased activity/exercise.  - Colonoscopy (2017, Dr. Wynetta Emery): Normal - Colonoscopy several years prior in [facility] n/f polyps per patient.  No report available for review  Past Medical History:  Diagnosis Date  . Anemia YRS AGO  . Anxiety   . Arthritis    JOINT PAIN HIPS AND KNEE  . Depression    NO MEDS SON DIED Oct 12, 2015, TAKING THERAPY  . Elevated cholesterol   . Family history of breast cancer   . Family history of lung cancer   . Family history of ovarian cancer   . Family history of pancreatic cancer   . Family history of  throat cancer   . GERD (gastroesophageal reflux disease)   . History of colonic polyps   . Hypertension   . Hypothyroidism   . Thyroid disease   . Tick bite 08/2015  . UTI (urinary tract infection)    FINISHED ANTIBIOTICS 11-11-15     Past Surgical History:  Procedure Laterality Date  . CESAREAN SECTION     X 4  . COLONOSCOPY WITH PROPOFOL N/A 11/17/2015   Procedure: COLONOSCOPY WITH PROPOFOL;  Surgeon: Garlan Fair, MD;  Location: [facility];  Service: Endoscopy;  Laterality: N/A;   Family History  Problem Relation Age of Onset  . Hypertension Mother   . Pancreatic cancer Son 22  . Ovarian cancer Maternal Grandmother 6  . Pancreatic cancer Half-Brother 73       non-smoker  . Lung cancer Maternal Aunt 72       non-smoker  . Throat cancer Maternal Aunt        dx. 70s, non-smoker  . Breast cancer Cousin 23  . Colon cancer Neg Hx   . Esophageal cancer Neg Hx    Social History   Tobacco Use  . Smoking status: Former Smoker    Years: 10.00    Types: Cigarettes    Quit date: 04/18/2000    Years since quitting: 19.3  . Smokeless tobacco: Never Used  . Tobacco comment: LIGHT SMOKER did dip in 20's   Substance Use Topics  . Alcohol use: No  . Drug use: Not Currently    Types: Marijuana  Comment: MARIJUANA  USE YEARS AGO IN COLLEGE   Current Outpatient Medications  Medication Sig Dispense Refill  . aspirin 325 MG tablet Take 325 mg by mouth daily.    Marland Kitchen atenolol (TENORMIN) 50 MG tablet Take 50 mg by mouth daily. for blood pressure  3  . cholecalciferol (VITAMIN D) 1000 UNITS tablet Take 1,000 Units by mouth daily.    Marland Kitchen ELDERBERRY PO Take 2 tablets by mouth daily. Take 2 gummies daily    . escitalopram (LEXAPRO) 10 MG tablet 1 tablet daily.    Marland Kitchen ezetimibe (ZETIA) 10 MG tablet 1 tablet daily.    . fluticasone (FLONASE) 50 MCG/ACT nasal spray Place 1 spray into both nostrils daily as needed for allergies or rhinitis.    Marland Kitchen levothyroxine (SYNTHROID, LEVOTHROID) 50 MCG  tablet Take 1 tablet by mouth daily.  3  . traMADol (ULTRAM) 50 MG tablet Take 1-2 tablets (50-100 mg total) by mouth every 6 (six) hours as needed. Up to twice daily as needed for pain 30 tablet 1  . triamterene-hydrochlorothiazide (MAXZIDE) 75-50 MG per tablet Take 1 tablet by mouth every morning.  3  . vitamin C (ASCORBIC ACID) 500 MG tablet Take 500 mg by mouth daily.     No current facility-administered medications for this visit.   Allergies  Allergen Reactions  . Zoloft [Sertraline Hcl] Other (See Comments)    "crazy feeling"     Review of Systems: All systems reviewed and negative except where noted in HPI.     Physical Exam:    Wt Readings from Last 3 Encounters:  08/08/19 182 lb (82.6 kg)  08/10/16 196 lb 12.8 oz (89.3 kg)  11/17/15 193 lb (87.5 kg)    BP 124/78   Pulse 70   Temp (!) 96.9 F (36.1 C)   Ht '5\' 1"'  (1.549 m)   Wt 182 lb (82.6 kg)   BMI 34.39 kg/m  Constitutional:  Pleasant, in no acute distress. Psychiatric: Normal mood and affect. Behavior is normal. EENT: Pupils normal.  Conjunctivae are normal. No scleral icterus. Neck supple. No cervical LAD. Cardiovascular: Normal rate, regular rhythm. No edema Pulmonary/chest: Effort normal and breath sounds normal. No wheezing, rales or rhonchi. Abdominal: Soft, nondistended, nontender. Bowel sounds active throughout. There are no masses palpable. No hepatomegaly. Neurological: Alert and oriented to person place and time. Skin: Skin is warm and dry. No rashes noted.   ASSESSMENT AND PLAN;   1) Family History of Pancreatic Cancer: 69 year old female with significant family history of Pancreatic Cancer, including her son and half-brother, both diagnosed/deceased in their 63s.  Recent genetic testing otherwise negative for known pathogenic mutation.  We discussed the most recent [place] and AGA guidelines, and with respect to her history, screening for pancreatic cancer recommended in  the following high risk individuals:  -=2 affected, genetically-related family members; familial pancreatic cancer kindred  Recommended age to start surveillance varies by gene mutation status and family history, as follows: -Familial pancreatic cancer kindred (without a known germline mutation)-start at age 51, or 59 years younger than the youngest affected blood relative  Screening techniques include the following: -Baseline: MRI/MRCP plus EUS plus fasting blood glucose and/or hemoglobin A1c -Follow-up/surveillance phase: Alternate MRI/MRCP and EUS plus routine fasting blood glucose and/or hemoglobin A1c -If concerning features on imaging, check CA 19-9 -EUS with FNA for solid lesions =5 mm, cystic lesions with worrisome features, or asymptomatic main pancreatic duct (MPD) strictures (with or without mass) -CT only for solid lesions, regardless  of size, or asymptomatic MPD strictures of unknown etiology (without mass)  -Screening interval every 12 months (or Q6 months per local center of excellence protocol) in patients with no abnormalities/nonconcerning abnormalities -Screening interval every 3-6 months in patients with abnormalities that are NOT suspicious for malignancy, but are concerning -EUS evaluation should be performed within 3-6 months for indeterminate lesions (abnormalities that are NOT suspicious for malignancy, but are concerning) -EUS evaluation should be performed within 3 months for high-risk lesions, if surgical resection is not planned. -Immediate surgical referral for abnormality suspicious for malignancy  -New-onset diabetes in a high-risk individual should lead to additional diagnostic studies or change in surveillance interval  -Genetic testing and counseling should be considered for familial pancreas cancer relatives who are eligible for surveillance. A positive germline mutation is associated with an increased risk of neoplastic progression and may also lead to  screening for other relevant associated cancers -Participation in a registry or referral to a pancreas Center of Excellence should be pursued when possible for high-risk patients undergoing pancreas cancer screening -The target detectable pancreatic neoplasms are resectable stage I pancreatic ductal adenocarcinoma and high-risk precursor neoplasms, such as intraductal papillary mucinous neoplasms with high-grade dysplasia and some enlarged pancreatic intraepithelial neoplasias  -We discussed the limitations and potential risks of pancreas cancer screening prior to initiating any screening program, and the patient  wishes to proceed with screening.  -MRI/MRCP and hemoglobin A1c now -Pending MRI/MRCP findings, plan for EUS in 6 months -RTC every 6 months, or sooner as needed  2) Dysphagia 3) Increased throat clearing  -EGD with dilation  4) Constipation 5) Change in bowel habits 6) History of colon polyps  -Colonoscopy to evaluate for mucosal/luminal pathology -Can certainly perform routine polyp surveillance at that time -Maintain adequate hydration -Continue current exercise/activity -Increase dietary fiber and add fiber supplement as needed  7) Weight loss -Patient attributes 18 pound weight loss over the last year or so to increased activity/regular exercise and some dietary modifications.  We will certainly look for GI pancreatic pathology during extensive evaluation as outlined above  The indications, risks, and benefits of EGD and colonoscopy were explained to the patient in detail. Risks include but are not limited to bleeding, perforation, adverse reaction to medications, and cardiopulmonary compromise. Sequelae include but are not limited to the possibility of surgery, hositalization, and mortality. The patient verbalized understanding and wished to proceed. All questions answered, referred to scheduler and bowel prep ordered. Further recommendations pending results of the exam.       Lavena Bullion, DO, FACG  08/08/2019, 11:10 AM   Aura Dials, MD

## 2019-08-08 NOTE — Patient Instructions (Signed)
You have been scheduled for an MRI/MRCP at [facility] on 08/23/19. Your appointment time is 10am. Please arrive 15 minutes prior to your appointment time for registration purposes. Please make certain not to have anything to eat or drink 6 hours prior to your test. In addition, if you have any metal in your body, have a pacemaker or defibrillator, please be sure to let your ordering physician know. This test typically takes 45 minutes to 1 hour to complete. Should you need to reschedule, please call [phone] to do so.  You have been scheduled for an endoscopy and colonoscopy. Please follow the written instructions given to you at your visit today. Please pick up your prep supplies at the pharmacy within the next 1-3 days. If you use inhalers (even only as needed), please bring them with you on the day of your procedure. Your physician has requested that you go to www.startemmi.com and enter the access code given to you at your visit today. This web site gives a general overview about your procedure. However, you should still follow specific instructions given to you by our office regarding your preparation for the procedure.  Return in 6 months  It was a pleasure to see you today!  Vito Cirigliano, D.O.

## 2019-08-09 DIAGNOSIS — Z23 Encounter for immunization: Secondary | ICD-10-CM | POA: Diagnosis not present

## 2019-08-21 ENCOUNTER — Telehealth: Payer: Self-pay | Admitting: Gastroenterology

## 2019-08-21 NOTE — Telephone Encounter (Signed)
Spoke to patient she will come by the office and pick up a sample of Clinpiq

## 2019-08-23 ENCOUNTER — Other Ambulatory Visit: Payer: Self-pay

## 2019-08-23 ENCOUNTER — Ambulatory Visit (HOSPITAL_COMMUNITY)
Admission: RE | Admit: 2019-08-23 | Discharge: 2019-08-23 | Disposition: A | Discharge status: Home / Self Care | Payer: Medicare HMO | Source: Ambulatory Visit | Attending: Gastroenterology | Admitting: Gastroenterology

## 2019-08-23 ENCOUNTER — Other Ambulatory Visit: Payer: Self-pay | Admitting: Gastroenterology

## 2019-08-23 DIAGNOSIS — K5900 Constipation, unspecified: Secondary | ICD-10-CM | POA: Diagnosis not present

## 2019-08-23 DIAGNOSIS — Z86010 Personal history of colonic polyps: Secondary | ICD-10-CM | POA: Diagnosis not present

## 2019-08-23 DIAGNOSIS — R1310 Dysphagia, unspecified: Secondary | ICD-10-CM

## 2019-08-23 DIAGNOSIS — Z800 Family history of malignant neoplasm of digestive organs: Secondary | ICD-10-CM

## 2019-08-23 DIAGNOSIS — R935 Abnormal findings on diagnostic imaging of other abdominal regions, including retroperitoneum: Secondary | ICD-10-CM | POA: Diagnosis not present

## 2019-08-23 DIAGNOSIS — Z8507 Personal history of malignant neoplasm of pancreas: Secondary | ICD-10-CM | POA: Diagnosis not present

## 2019-09-05 ENCOUNTER — Encounter: Payer: Self-pay | Admitting: Gastroenterology

## 2019-09-17 ENCOUNTER — Telehealth: Payer: Self-pay

## 2019-09-17 ENCOUNTER — Telehealth: Payer: Self-pay | Admitting: Gastroenterology

## 2019-09-17 NOTE — Telephone Encounter (Signed)
Dr. Bryan Lemma This pt called and cancelled ENDO/COLON scheduled for 09-18-19 8:30 am  appt, she  Stated Her mother is ill and she is very stressed. She did reschedule for June 11. Just wanted to let you know

## 2019-09-17 NOTE — Telephone Encounter (Signed)
Pt is scheduled to see Dr. Bryan Lemma on 09/18/19 at 0830. Tried to contact patient to verify if she had completed the covid vaccine series, or if she went to a covid test done. I was unable to reach patient, and her voicemail box is full and unable to accept messages at this time. Will try to call patient again at end of work day.

## 2019-09-17 NOTE — Telephone Encounter (Signed)
Thank you :)

## 2019-09-18 ENCOUNTER — Encounter: Payer: Medicare HMO | Admitting: Gastroenterology

## 2019-09-26 ENCOUNTER — Encounter: Payer: Self-pay | Admitting: Certified Registered Nurse Anesthetist

## 2019-09-27 ENCOUNTER — Other Ambulatory Visit: Payer: Self-pay

## 2019-09-27 ENCOUNTER — Encounter: Payer: Self-pay | Admitting: Gastroenterology

## 2019-09-27 ENCOUNTER — Ambulatory Visit (AMBULATORY_SURGERY_CENTER): Payer: Medicare HMO | Admitting: Gastroenterology

## 2019-09-27 VITALS — BP 129/75 | HR 70 | Temp 97.1°F | Resp 13 | Ht 61.0 in | Wt 182.0 lb

## 2019-09-27 DIAGNOSIS — K5900 Constipation, unspecified: Secondary | ICD-10-CM

## 2019-09-27 DIAGNOSIS — K640 First degree hemorrhoids: Secondary | ICD-10-CM

## 2019-09-27 DIAGNOSIS — K319 Disease of stomach and duodenum, unspecified: Secondary | ICD-10-CM | POA: Diagnosis not present

## 2019-09-27 DIAGNOSIS — Z86010 Personal history of colonic polyps: Secondary | ICD-10-CM

## 2019-09-27 DIAGNOSIS — R1310 Dysphagia, unspecified: Secondary | ICD-10-CM | POA: Diagnosis not present

## 2019-09-27 DIAGNOSIS — K219 Gastro-esophageal reflux disease without esophagitis: Secondary | ICD-10-CM | POA: Diagnosis not present

## 2019-09-27 DIAGNOSIS — K2970 Gastritis, unspecified, without bleeding: Secondary | ICD-10-CM | POA: Diagnosis not present

## 2019-09-27 DIAGNOSIS — K2950 Unspecified chronic gastritis without bleeding: Secondary | ICD-10-CM | POA: Diagnosis not present

## 2019-09-27 DIAGNOSIS — I10 Essential (primary) hypertension: Secondary | ICD-10-CM | POA: Diagnosis not present

## 2019-09-27 DIAGNOSIS — E7800 Pure hypercholesterolemia, unspecified: Secondary | ICD-10-CM | POA: Diagnosis not present

## 2019-09-27 DIAGNOSIS — K5730 Diverticulosis of large intestine without perforation or abscess without bleeding: Secondary | ICD-10-CM | POA: Diagnosis not present

## 2019-09-27 DIAGNOSIS — D125 Benign neoplasm of sigmoid colon: Secondary | ICD-10-CM

## 2019-09-27 MED ORDER — OMEPRAZOLE 20 MG PO CPDR
20.0000 mg | DELAYED_RELEASE_CAPSULE | Freq: Two times a day (BID) | ORAL | 2 refills | Status: DC
Start: 2019-09-27 — End: 2019-12-24

## 2019-09-27 MED ORDER — SODIUM CHLORIDE 0.9 % IV SOLN: 500.0000 mL | Freq: Once | INTRAVENOUS | Status: DC

## 2019-09-27 NOTE — Op Note (Signed)
[facility] Patient Name: Karen [name] Procedure Date: 09/27/2019 11:06 AM MRN: [id number] Endoscopist: Gerrit Heck , MD Age: 69 Referring MD:  Date of Birth: Feb 15, 1951 Gender: Female Account #: [id number] Procedure:                Upper GI endoscopy Indications:              Dysphagia, Suspected esophageal reflux, Increased                            throat clearing Medicines:                Monitored Anesthesia Care Procedure:                Pre-Anesthesia Assessment:                           - Prior to the procedure, a History and Physical                            was performed, and patient medications and                            allergies were reviewed. The patient's tolerance of                            previous anesthesia was also reviewed. The risks                            and benefits of the procedure and the sedation                            options and risks were discussed with the patient.                            All questions were answered, and informed consent                            was obtained. Prior Anticoagulants: The patient has                            taken no previous anticoagulant or antiplatelet                            agents. ASA Grade Assessment: II - A patient with                            mild systemic disease. After reviewing the risks                            and benefits, the patient was deemed in                            satisfactory condition to undergo the procedure.  After obtaining informed consent, the endoscope was                            passed under direct vision. Throughout the                            procedure, the patient's blood pressure, pulse, and                            oxygen saturations were monitored continuously. The                            Endoscope was introduced through the mouth, and                            advanced to the second part of  duodenum. The upper                            GI endoscopy was accomplished without difficulty.                            The patient tolerated the procedure well. Scope In: Scope Out: Findings:                 The examined esophagus was normal. The scope was                            withdrawn. Empiric dilation was performed with a                            Maloney dilator with mild resistance at 16 Fr. The                            dilation site was examined following endoscope                            reinsertion and showed mild mucosal disruption at                            16 cm from the incisors, at the level of the UES,                            consistent with successful dialtion. Estimated                            blood loss was minimal.                           The Z-line was regular and was found 38 cm from the                            incisors.  Scattered mild inflammation characterized by                            erythema was found in the gastric fundus, in the                            gastric body and in the gastric antrum. Biopsies                            were taken with a cold forceps for Helicobacter                            pylori testing. Estimated blood loss was minimal.                           The duodenal bulb, first portion of the duodenum                            and second portion of the duodenum were normal. Complications:            No immediate complications. Estimated Blood Loss:     Estimated blood loss was minimal. Impression:               - Normal esophagus. Dilated.                           - Z-line regular, 38 cm from the incisors.                           - Gastritis. Biopsied.                           - Normal duodenal bulb, first portion of the                            duodenum and second portion of the duodenum. Recommendation:           - Patient has a contact number available for                             emergencies. The signs and symptoms of potential                            delayed complications were discussed with the                            patient. Return to normal activities tomorrow.                            Written discharge instructions were provided to the                            patient.                           -  Soft diet today.                           - Continue present medications.                           - Use Prilosec (omeprazole) 20 mg PO BID for 6                            weeks to promote mucosal healing along with                            diagnostic/therapeutic intent for suspected                            atypical reflux symptoms. Can then reduce to 20                            mg/day and continue to titrate to lowest effective                            dose, or stop completely if symptoms resolve.                           - Perform a colonoscopy today.                           - Return to GI clinic in 6 months or sooner as                            needed. Gerrit Heck, MD 09/27/2019 11:52:38 AM

## 2019-09-27 NOTE — Patient Instructions (Signed)
Impression/Recommendations:  Dilation diet handout given to patient.  Soft diet today. Gastritis handout given to patient.  Polyp handout given to patient. Diverticulosis handout given to patient. Hemorrhoid handout given to patient.  Continue present medications. Prilosec (omeprazole) 20 mg. By mouth 2 times daily for 6 weeks.   Then reduce to 20 mg/day and continue to lower the the least effective dose, or stop completely if symptoms resolve.  Return to GI clinic in 6 weeks or sooner as needed.  Await pathology results.  Repeat colonoscopy for surveillance.  Date to be determined after pathology results reviewed.  YOU HAD AN ENDOSCOPIC PROCEDURE TODAY AT [facility]:   Refer to the procedure report that was given to you for any specific questions about what was found during the examination.  If the procedure report does not answer your questions, please call your gastroenterologist to clarify.  If you requested that your care partner not be given the details of your procedure findings, then the procedure report has been included in a sealed envelope for you to review at your convenience later.  YOU SHOULD EXPECT: Some feelings of bloating in the abdomen. Passage of more gas than usual.  Walking can help get rid of the air that was put into your GI tract during the procedure and reduce the bloating. If you had a lower endoscopy (such as a colonoscopy or flexible sigmoidoscopy) you may notice spotting of blood in your stool or on the toilet paper. If you underwent a bowel prep for your procedure, you may not have a normal bowel movement for a few days.  Please Note:  You might notice some irritation and congestion in your nose or some drainage.  This is from the oxygen used during your procedure.  There is no need for concern and it should clear up in a day or so.  SYMPTOMS TO REPORT IMMEDIATELY:   Following lower endoscopy (colonoscopy or flexible  sigmoidoscopy):  Excessive amounts of blood in the stool  Significant tenderness or worsening of abdominal pains  Swelling of the abdomen that is new, acute  Fever of 100F or higher   Following upper endoscopy (EGD)  Vomiting of blood or coffee ground material  New chest pain or pain under the shoulder blades  Painful or persistently difficult swallowing  New shortness of breath  Fever of 100F or higher  Black, tarry-looking stools  For urgent or emergent issues, a gastroenterologist can be reached at any hour by calling [phone]. Do not use MyChart messaging for urgent concerns.    DIET:  We do recommend a small meal at first, but then you may proceed to your regular diet.  Drink plenty of fluids but you should avoid alcoholic beverages for 24 hours.  ACTIVITY:  You should plan to take it easy for the rest of today and you should NOT DRIVE or use heavy machinery until tomorrow (because of the sedation medicines used during the test).    FOLLOW UP: Our staff will call the number listed on your records 48-72 hours following your procedure to check on you and address any questions or concerns that you may have regarding the information given to you following your procedure. If we do not reach you, we will leave a message.  We will attempt to reach you two times.  During this call, we will ask if you have developed any symptoms of COVID 19. If you develop any symptoms (ie: fever, flu-like symptoms, shortness of breath, cough  etc.) before then, please call [phone].  If you test positive for Covid 19 in the 2 weeks post procedure, please call and report this information to [place].    If any biopsies were taken you will be contacted by phone or by letter within the next 1-3 weeks.  Please call us at [phone] if you have not heard about the biopsies in 3 weeks.    SIGNATURES/CONFIDENTIALITY: You and/or your care partner have signed paperwork which will be entered into your  electronic medical record.  These signatures attest to the fact that that the information above on your After Visit Summary has been reviewed and is understood.  Full responsibility of the confidentiality of this discharge information lies with you and/or your care-partner.

## 2019-09-27 NOTE — Progress Notes (Signed)
Pt. Spitting out clear mucous frequently.

## 2019-09-27 NOTE — Progress Notes (Signed)
Called to room to assist during endoscopic procedure.  Patient ID and intended procedure confirmed with present staff. Received instructions for my participation in the procedure from the performing physician.  

## 2019-09-27 NOTE — Progress Notes (Signed)
Report given to PACU, vss 

## 2019-09-27 NOTE — Op Note (Signed)
[facility] Patient Name: Karen [name] Procedure Date: 09/27/2019 11:05 AM MRN: [id number] Endoscopist: Gerrit Heck , MD Age: 69 Referring MD:  Date of Birth: 10/13/1950 Gender: Female Account #: [id number] Procedure:                Colonoscopy Indications:              Constipation Medicines:                Monitored Anesthesia Care Procedure:                Pre-Anesthesia Assessment:                           - Prior to the procedure, a History and Physical                            was performed, and patient medications and                            allergies were reviewed. The patient's tolerance of                            previous anesthesia was also reviewed. The risks                            and benefits of the procedure and the sedation                            options and risks were discussed with the patient.                            All questions were answered, and informed consent                            was obtained. Prior Anticoagulants: The patient has                            taken no previous anticoagulant or antiplatelet                            agents except for aspirin. ASA Grade Assessment: II                            - A patient with mild systemic disease. After                            reviewing the risks and benefits, the patient was                            deemed in satisfactory condition to undergo the                            procedure.  After obtaining informed consent, the colonoscope                            was passed under direct vision. Throughout the                            procedure, the patient's blood pressure, pulse, and                            oxygen saturations were monitored continuously. The                            Colonoscope was introduced through the anus and                            advanced to the the cecum, identified by                             appendiceal orifice and ileocecal valve. The                            colonoscopy was technically difficult and complex                            due to significant looping. Successful completion                            of the procedure was aided by applying abdominal                            pressure. The patient tolerated the procedure well.                            The quality of the bowel preparation was good. The                            ileocecal valve, appendiceal orifice, and rectum                            were photographed. Scope In: 11:23:33 AM Scope Out: 11:44:46 AM Scope Withdrawal Time: 0 hours 12 minutes 34 seconds  Total Procedure Duration: 0 hours 21 minutes 13 seconds  Findings:                 Skin tags were found on perianal exam.                           Two sessile polyps were found in the sigmoid colon.                            The polyps were 2 to 3 mm in size. These polyps                            were removed with a cold biopsy  forceps. Resection                            and retrieval were complete. Estimated blood loss                            was minimal.                           A few small-mouthed diverticula were found in the                            sigmoid colon.                           Non-bleeding internal hemorrhoids and hypertrophied                            anal papillae were found during retroflexion. The                            hemorrhoids were small.                           The sigmoid colon and ascending colon revealed                            significantly excessive looping. Advancing the                            scope required changing the patient to a supine                            position and using manual pressure. Complications:            No immediate complications. Estimated Blood Loss:     Estimated blood loss was minimal. Impression:               - Perianal skin tags found on perianal exam.                            - Two 2 to 3 mm polyps in the sigmoid colon,                            removed with a cold biopsy forceps. Resected and                            retrieved.                           - Diverticulosis in the sigmoid colon.                           - Non-bleeding internal hemorrhoids.                           - There was significant looping of  the colon. Recommendation:           - Patient has a contact number available for                            emergencies. The signs and symptoms of potential                            delayed complications were discussed with the                            patient. Return to normal activities tomorrow.                            Written discharge instructions were provided to the                            patient.                           - Soft diet today.                           - Continue present medications.                           - Await pathology results.                           - Repeat colonoscopy for surveillance based on                            pathology results. Gerrit Heck, MD 09/27/2019 11:56:53 AM

## 2019-10-01 ENCOUNTER — Telehealth: Payer: Self-pay

## 2019-10-01 ENCOUNTER — Encounter: Payer: Self-pay | Admitting: Gastroenterology

## 2019-10-01 NOTE — Telephone Encounter (Signed)
  Follow up Call-  Call back number 09/27/2019  Post procedure Call Back phone  # [phone]  Permission to leave phone message Yes  Some recent data might be hidden     Patient questions:  Do you have a fever, pain , or abdominal swelling? No. Pain Score  0 *  Have you tolerated food without any problems? Yes.    Have you been able to return to your normal activities? Yes.    Do you have any questions about your discharge instructions: Diet   No. Medications  No. Follow up visit  No.  Do you have questions or concerns about your Care? No.  Actions: * If pain score is 4 or above: No action needed, pain <4.  1. Have you developed a fever since your procedure? no  2.   Have you had an respiratory symptoms (SOB or cough) since your procedure? no  3.   Have you tested positive for COVID 19 since your procedure no  4.   Have you had any family members/close contacts diagnosed with the COVID 19 since your procedure?  no   If yes to any of these questions please route to Joylene John, RN and Erenest Rasher, RN

## 2019-10-01 NOTE — Telephone Encounter (Signed)
No answer, unable to leave a message, B.Shahd Occhipinti RN. 

## 2019-10-01 NOTE — Telephone Encounter (Signed)
Patient returned your follow-up call after her procedure. She stated to be feeling fine.

## 2019-11-05 DIAGNOSIS — Z862 Personal history of diseases of the blood and blood-forming organs and certain disorders involving the immune mechanism: Secondary | ICD-10-CM | POA: Diagnosis not present

## 2019-11-05 DIAGNOSIS — K219 Gastro-esophageal reflux disease without esophagitis: Secondary | ICD-10-CM | POA: Diagnosis not present

## 2019-11-05 DIAGNOSIS — D126 Benign neoplasm of colon, unspecified: Secondary | ICD-10-CM | POA: Diagnosis not present

## 2019-11-05 DIAGNOSIS — E782 Mixed hyperlipidemia: Secondary | ICD-10-CM | POA: Diagnosis not present

## 2019-11-05 DIAGNOSIS — F419 Anxiety disorder, unspecified: Secondary | ICD-10-CM | POA: Diagnosis not present

## 2019-11-05 DIAGNOSIS — E039 Hypothyroidism, unspecified: Secondary | ICD-10-CM | POA: Diagnosis not present

## 2019-11-05 DIAGNOSIS — E669 Obesity, unspecified: Secondary | ICD-10-CM | POA: Diagnosis not present

## 2019-11-05 DIAGNOSIS — H9313 Tinnitus, bilateral: Secondary | ICD-10-CM | POA: Diagnosis not present

## 2019-11-05 DIAGNOSIS — I10 Essential (primary) hypertension: Secondary | ICD-10-CM | POA: Diagnosis not present

## 2019-12-09 ENCOUNTER — Ambulatory Visit (HOSPITAL_COMMUNITY)
Admission: EM | Admit: 2019-12-09 | Discharge: 2019-12-09 | Disposition: A | Discharge status: Home / Self Care | Payer: Medicare HMO | Attending: Internal Medicine | Admitting: Internal Medicine

## 2019-12-09 ENCOUNTER — Other Ambulatory Visit: Payer: Self-pay

## 2019-12-09 DIAGNOSIS — Z1152 Encounter for screening for COVID-19: Secondary | ICD-10-CM

## 2019-12-09 DIAGNOSIS — Z20822 Contact with and (suspected) exposure to covid-19: Secondary | ICD-10-CM | POA: Insufficient documentation

## 2019-12-09 LAB — SARS CORONAVIRUS 2 (TAT 6-24 HRS): SARS Coronavirus 2: NEGATIVE

## 2019-12-09 NOTE — ED Triage Notes (Signed)
Pt presents for covid testing after a family exposure.

## 2019-12-09 NOTE — Discharge Instructions (Signed)
If your Covid-19 test is positive, you will get a phone call from [facility] regarding your results. If your Covid-19 test is negative, you will NOT get a phone call from  with your results. You may view your results on MyChart. If you do not have a MyChart account, sign up instructions are in your discharge papers. ° °

## 2019-12-13 DIAGNOSIS — D126 Benign neoplasm of colon, unspecified: Secondary | ICD-10-CM | POA: Diagnosis not present

## 2019-12-13 DIAGNOSIS — E782 Mixed hyperlipidemia: Secondary | ICD-10-CM | POA: Diagnosis not present

## 2019-12-13 DIAGNOSIS — Z862 Personal history of diseases of the blood and blood-forming organs and certain disorders involving the immune mechanism: Secondary | ICD-10-CM | POA: Diagnosis not present

## 2019-12-13 DIAGNOSIS — H9313 Tinnitus, bilateral: Secondary | ICD-10-CM | POA: Diagnosis not present

## 2019-12-13 DIAGNOSIS — E669 Obesity, unspecified: Secondary | ICD-10-CM | POA: Diagnosis not present

## 2019-12-13 DIAGNOSIS — K219 Gastro-esophageal reflux disease without esophagitis: Secondary | ICD-10-CM | POA: Diagnosis not present

## 2019-12-13 DIAGNOSIS — E039 Hypothyroidism, unspecified: Secondary | ICD-10-CM | POA: Diagnosis not present

## 2019-12-13 DIAGNOSIS — F419 Anxiety disorder, unspecified: Secondary | ICD-10-CM | POA: Diagnosis not present

## 2019-12-13 DIAGNOSIS — I10 Essential (primary) hypertension: Secondary | ICD-10-CM | POA: Diagnosis not present

## 2019-12-22 ENCOUNTER — Other Ambulatory Visit: Payer: Self-pay | Admitting: Gastroenterology

## 2019-12-28 DIAGNOSIS — H524 Presbyopia: Secondary | ICD-10-CM | POA: Diagnosis not present

## 2020-01-16 DIAGNOSIS — F329 Major depressive disorder, single episode, unspecified: Secondary | ICD-10-CM | POA: Diagnosis not present

## 2020-01-16 DIAGNOSIS — E7800 Pure hypercholesterolemia, unspecified: Secondary | ICD-10-CM | POA: Diagnosis not present

## 2020-01-16 DIAGNOSIS — D508 Other iron deficiency anemias: Secondary | ICD-10-CM | POA: Diagnosis not present

## 2020-01-16 DIAGNOSIS — E782 Mixed hyperlipidemia: Secondary | ICD-10-CM | POA: Diagnosis not present

## 2020-01-16 DIAGNOSIS — E039 Hypothyroidism, unspecified: Secondary | ICD-10-CM | POA: Diagnosis not present

## 2020-01-16 DIAGNOSIS — I10 Essential (primary) hypertension: Secondary | ICD-10-CM | POA: Diagnosis not present

## 2020-01-22 ENCOUNTER — Other Ambulatory Visit: Payer: Self-pay | Admitting: Gastroenterology

## 2020-01-24 DIAGNOSIS — I10 Essential (primary) hypertension: Secondary | ICD-10-CM | POA: Diagnosis not present

## 2020-01-24 DIAGNOSIS — E782 Mixed hyperlipidemia: Secondary | ICD-10-CM | POA: Diagnosis not present

## 2020-01-24 DIAGNOSIS — G894 Chronic pain syndrome: Secondary | ICD-10-CM | POA: Diagnosis not present

## 2020-01-24 DIAGNOSIS — E669 Obesity, unspecified: Secondary | ICD-10-CM | POA: Diagnosis not present

## 2020-01-24 DIAGNOSIS — H9313 Tinnitus, bilateral: Secondary | ICD-10-CM | POA: Diagnosis not present

## 2020-01-24 DIAGNOSIS — K219 Gastro-esophageal reflux disease without esophagitis: Secondary | ICD-10-CM | POA: Diagnosis not present

## 2020-01-24 DIAGNOSIS — E039 Hypothyroidism, unspecified: Secondary | ICD-10-CM | POA: Diagnosis not present

## 2020-01-24 DIAGNOSIS — F418 Other specified anxiety disorders: Secondary | ICD-10-CM | POA: Diagnosis not present

## 2020-02-12 ENCOUNTER — Other Ambulatory Visit: Payer: Self-pay | Admitting: Gastroenterology

## 2020-02-16 DIAGNOSIS — D508 Other iron deficiency anemias: Secondary | ICD-10-CM | POA: Diagnosis not present

## 2020-02-16 DIAGNOSIS — E782 Mixed hyperlipidemia: Secondary | ICD-10-CM | POA: Diagnosis not present

## 2020-02-16 DIAGNOSIS — E7800 Pure hypercholesterolemia, unspecified: Secondary | ICD-10-CM | POA: Diagnosis not present

## 2020-02-16 DIAGNOSIS — I10 Essential (primary) hypertension: Secondary | ICD-10-CM | POA: Diagnosis not present

## 2020-02-16 DIAGNOSIS — E039 Hypothyroidism, unspecified: Secondary | ICD-10-CM | POA: Diagnosis not present

## 2020-02-16 DIAGNOSIS — F329 Major depressive disorder, single episode, unspecified: Secondary | ICD-10-CM | POA: Diagnosis not present

## 2020-02-25 DIAGNOSIS — H903 Sensorineural hearing loss, bilateral: Secondary | ICD-10-CM | POA: Diagnosis not present

## 2020-02-25 DIAGNOSIS — F419 Anxiety disorder, unspecified: Secondary | ICD-10-CM | POA: Diagnosis not present

## 2020-02-25 DIAGNOSIS — H9313 Tinnitus, bilateral: Secondary | ICD-10-CM | POA: Diagnosis not present

## 2020-03-05 ENCOUNTER — Other Ambulatory Visit: Payer: Self-pay

## 2020-03-05 ENCOUNTER — Telehealth: Payer: Self-pay

## 2020-03-05 DIAGNOSIS — Z800 Family history of malignant neoplasm of digestive organs: Secondary | ICD-10-CM

## 2020-03-05 NOTE — Telephone Encounter (Signed)
The pt has been scheduled for 04/02/20 at 730 am at [facility] with Dr Ardis Hughs.  COVID test on 12/13  EUS scheduled, pt instructed and medications reviewed.  Patient instructions mailed to home.  Patient to call with any questions or concerns.

## 2020-03-05 NOTE — Telephone Encounter (Signed)
-----   Message from Milus Banister, MD sent at 03/05/2020  7:02 AM EST ----- Regarding: RE: Reviewing patient lists Agree  Thanks  ----- Message ----- From: Irving Copas., MD Sent: 03/04/2020  11:41 PM EST To: Milus Banister, MD, Timothy Lasso, RN, # Subject: Reviewing patient lists                        Rehman Levinson, This patient is due for EUS for High Risk Pancreatic Cancer screening as she had her MRI In 5/21 and our EUS should follow at 66-months. Please schedule with DJ or myself next available. Please update Dr. Bryan Lemma after patient is scheduled. Thank you. GM

## 2020-03-17 DIAGNOSIS — F329 Major depressive disorder, single episode, unspecified: Secondary | ICD-10-CM | POA: Diagnosis not present

## 2020-03-17 DIAGNOSIS — I10 Essential (primary) hypertension: Secondary | ICD-10-CM | POA: Diagnosis not present

## 2020-03-17 DIAGNOSIS — E039 Hypothyroidism, unspecified: Secondary | ICD-10-CM | POA: Diagnosis not present

## 2020-03-17 DIAGNOSIS — K219 Gastro-esophageal reflux disease without esophagitis: Secondary | ICD-10-CM | POA: Diagnosis not present

## 2020-03-17 DIAGNOSIS — E782 Mixed hyperlipidemia: Secondary | ICD-10-CM | POA: Diagnosis not present

## 2020-03-17 DIAGNOSIS — D508 Other iron deficiency anemias: Secondary | ICD-10-CM | POA: Diagnosis not present

## 2020-03-17 DIAGNOSIS — E7800 Pure hypercholesterolemia, unspecified: Secondary | ICD-10-CM | POA: Diagnosis not present

## 2020-03-30 ENCOUNTER — Other Ambulatory Visit (HOSPITAL_COMMUNITY): Payer: Medicare HMO

## 2020-04-01 ENCOUNTER — Telehealth: Payer: Self-pay

## 2020-04-01 NOTE — Telephone Encounter (Signed)
Pt needs to reschedule her EUS with Dr Ardis Hughs

## 2020-04-02 ENCOUNTER — Other Ambulatory Visit: Payer: Self-pay

## 2020-04-02 ENCOUNTER — Encounter (HOSPITAL_COMMUNITY): Admission: RE | Payer: Self-pay | Source: Home / Self Care

## 2020-04-02 ENCOUNTER — Ambulatory Visit (HOSPITAL_COMMUNITY): Admission: RE | Admit: 2020-04-02 | Payer: Medicare HMO | Source: Home / Self Care | Admitting: Gastroenterology

## 2020-04-02 DIAGNOSIS — Z800 Family history of malignant neoplasm of digestive organs: Secondary | ICD-10-CM

## 2020-04-02 SURGERY — UPPER ENDOSCOPIC ULTRASOUND (EUS) RADIAL
Anesthesia: Monitor Anesthesia Care

## 2020-04-02 NOTE — Telephone Encounter (Signed)
EUS scheduled, pt instructed and medications reviewed.  Patient instructions mailed to home.  Patient to call with any questions or concerns.  

## 2020-04-02 NOTE — Telephone Encounter (Signed)
EUS scheduled on 05/28/20 at 730 am at [facility] with Dr Ardis Hughs  COVID test on 05/25/20 at 1015 am

## 2020-04-17 DIAGNOSIS — E7800 Pure hypercholesterolemia, unspecified: Secondary | ICD-10-CM | POA: Diagnosis not present

## 2020-04-17 DIAGNOSIS — I10 Essential (primary) hypertension: Secondary | ICD-10-CM | POA: Diagnosis not present

## 2020-04-17 DIAGNOSIS — E782 Mixed hyperlipidemia: Secondary | ICD-10-CM | POA: Diagnosis not present

## 2020-04-17 DIAGNOSIS — E039 Hypothyroidism, unspecified: Secondary | ICD-10-CM | POA: Diagnosis not present

## 2020-04-17 DIAGNOSIS — D508 Other iron deficiency anemias: Secondary | ICD-10-CM | POA: Diagnosis not present

## 2020-04-17 DIAGNOSIS — K219 Gastro-esophageal reflux disease without esophagitis: Secondary | ICD-10-CM | POA: Diagnosis not present

## 2020-04-17 DIAGNOSIS — F329 Major depressive disorder, single episode, unspecified: Secondary | ICD-10-CM | POA: Diagnosis not present

## 2020-05-18 DIAGNOSIS — I10 Essential (primary) hypertension: Secondary | ICD-10-CM | POA: Diagnosis not present

## 2020-05-18 DIAGNOSIS — E782 Mixed hyperlipidemia: Secondary | ICD-10-CM | POA: Diagnosis not present

## 2020-05-18 DIAGNOSIS — F329 Major depressive disorder, single episode, unspecified: Secondary | ICD-10-CM | POA: Diagnosis not present

## 2020-05-18 DIAGNOSIS — D508 Other iron deficiency anemias: Secondary | ICD-10-CM | POA: Diagnosis not present

## 2020-05-18 DIAGNOSIS — E039 Hypothyroidism, unspecified: Secondary | ICD-10-CM | POA: Diagnosis not present

## 2020-05-18 DIAGNOSIS — E7800 Pure hypercholesterolemia, unspecified: Secondary | ICD-10-CM | POA: Diagnosis not present

## 2020-05-18 DIAGNOSIS — K219 Gastro-esophageal reflux disease without esophagitis: Secondary | ICD-10-CM | POA: Diagnosis not present

## 2020-05-25 ENCOUNTER — Other Ambulatory Visit (HOSPITAL_COMMUNITY): Payer: Medicare HMO

## 2020-05-26 ENCOUNTER — Other Ambulatory Visit (HOSPITAL_COMMUNITY): Payer: Medicare HMO

## 2020-05-26 ENCOUNTER — Telehealth: Payer: Self-pay

## 2020-05-26 NOTE — Telephone Encounter (Signed)
The pt states she is going now for the COVID testing. She is planing on keeping the appt as planned.

## 2020-05-26 NOTE — Telephone Encounter (Signed)
-----   Message from Milus Banister, MD sent at 05/26/2020  9:55 AM EST ----- She is on for EUS this Thursday, rescheduled for the same a couple months ago.  Can you see if she is planning on getting her covid test and then showing for the EUS?  If not, I would hope to pull my last case of the day earlier.    Thanks

## 2020-05-26 NOTE — Telephone Encounter (Signed)
Dr Ardis Hughs the pt called back to cancel her procedure for 2/10.  She states she has too much going on and will call back when she is ready to reschedule. I have moved all the cases up for 2/10

## 2020-05-28 ENCOUNTER — Ambulatory Visit (HOSPITAL_COMMUNITY): Admission: RE | Admit: 2020-05-28 | Payer: Medicare HMO | Source: Home / Self Care | Admitting: Gastroenterology

## 2020-05-28 ENCOUNTER — Encounter (HOSPITAL_COMMUNITY): Admission: RE | Payer: Self-pay | Source: Home / Self Care

## 2020-05-28 SURGERY — UPPER ENDOSCOPIC ULTRASOUND (EUS) RADIAL
Anesthesia: Monitor Anesthesia Care

## 2020-06-09 DIAGNOSIS — E782 Mixed hyperlipidemia: Secondary | ICD-10-CM | POA: Diagnosis not present

## 2020-06-09 DIAGNOSIS — Z23 Encounter for immunization: Secondary | ICD-10-CM | POA: Diagnosis not present

## 2020-06-09 DIAGNOSIS — Z9109 Other allergy status, other than to drugs and biological substances: Secondary | ICD-10-CM | POA: Diagnosis not present

## 2020-06-09 DIAGNOSIS — E039 Hypothyroidism, unspecified: Secondary | ICD-10-CM | POA: Diagnosis not present

## 2020-06-09 DIAGNOSIS — D508 Other iron deficiency anemias: Secondary | ICD-10-CM | POA: Diagnosis not present

## 2020-06-09 DIAGNOSIS — J0110 Acute frontal sinusitis, unspecified: Secondary | ICD-10-CM | POA: Diagnosis not present

## 2020-06-09 DIAGNOSIS — I10 Essential (primary) hypertension: Secondary | ICD-10-CM | POA: Diagnosis not present

## 2020-06-09 DIAGNOSIS — F418 Other specified anxiety disorders: Secondary | ICD-10-CM | POA: Diagnosis not present

## 2020-06-15 DIAGNOSIS — K219 Gastro-esophageal reflux disease without esophagitis: Secondary | ICD-10-CM | POA: Diagnosis not present

## 2020-06-15 DIAGNOSIS — F329 Major depressive disorder, single episode, unspecified: Secondary | ICD-10-CM | POA: Diagnosis not present

## 2020-06-15 DIAGNOSIS — E782 Mixed hyperlipidemia: Secondary | ICD-10-CM | POA: Diagnosis not present

## 2020-06-15 DIAGNOSIS — I10 Essential (primary) hypertension: Secondary | ICD-10-CM | POA: Diagnosis not present

## 2020-06-15 DIAGNOSIS — E7800 Pure hypercholesterolemia, unspecified: Secondary | ICD-10-CM | POA: Diagnosis not present

## 2020-06-15 DIAGNOSIS — D508 Other iron deficiency anemias: Secondary | ICD-10-CM | POA: Diagnosis not present

## 2020-06-15 DIAGNOSIS — E039 Hypothyroidism, unspecified: Secondary | ICD-10-CM | POA: Diagnosis not present

## 2020-06-19 ENCOUNTER — Other Ambulatory Visit: Payer: Self-pay | Admitting: Family Medicine

## 2020-06-19 DIAGNOSIS — Z1231 Encounter for screening mammogram for malignant neoplasm of breast: Secondary | ICD-10-CM

## 2020-07-15 DIAGNOSIS — E039 Hypothyroidism, unspecified: Secondary | ICD-10-CM | POA: Diagnosis not present

## 2020-07-15 DIAGNOSIS — K219 Gastro-esophageal reflux disease without esophagitis: Secondary | ICD-10-CM | POA: Diagnosis not present

## 2020-07-15 DIAGNOSIS — F329 Major depressive disorder, single episode, unspecified: Secondary | ICD-10-CM | POA: Diagnosis not present

## 2020-07-15 DIAGNOSIS — I10 Essential (primary) hypertension: Secondary | ICD-10-CM | POA: Diagnosis not present

## 2020-07-15 DIAGNOSIS — E7800 Pure hypercholesterolemia, unspecified: Secondary | ICD-10-CM | POA: Diagnosis not present

## 2020-07-15 DIAGNOSIS — E782 Mixed hyperlipidemia: Secondary | ICD-10-CM | POA: Diagnosis not present

## 2020-07-15 DIAGNOSIS — D508 Other iron deficiency anemias: Secondary | ICD-10-CM | POA: Diagnosis not present

## 2020-08-13 DIAGNOSIS — E782 Mixed hyperlipidemia: Secondary | ICD-10-CM | POA: Diagnosis not present

## 2020-08-13 DIAGNOSIS — E039 Hypothyroidism, unspecified: Secondary | ICD-10-CM | POA: Diagnosis not present

## 2020-08-13 DIAGNOSIS — D508 Other iron deficiency anemias: Secondary | ICD-10-CM | POA: Diagnosis not present

## 2020-08-13 DIAGNOSIS — F329 Major depressive disorder, single episode, unspecified: Secondary | ICD-10-CM | POA: Diagnosis not present

## 2020-08-13 DIAGNOSIS — I10 Essential (primary) hypertension: Secondary | ICD-10-CM | POA: Diagnosis not present

## 2020-08-13 DIAGNOSIS — K219 Gastro-esophageal reflux disease without esophagitis: Secondary | ICD-10-CM | POA: Diagnosis not present

## 2020-08-13 DIAGNOSIS — E7800 Pure hypercholesterolemia, unspecified: Secondary | ICD-10-CM | POA: Diagnosis not present

## 2020-08-17 ENCOUNTER — Ambulatory Visit
Admission: RE | Admit: 2020-08-17 | Discharge: 2020-08-17 | Disposition: A | Discharge status: Home / Self Care | Payer: Medicare HMO | Source: Ambulatory Visit | Attending: Family Medicine | Admitting: Family Medicine

## 2020-08-17 ENCOUNTER — Other Ambulatory Visit: Payer: Self-pay

## 2020-08-17 ENCOUNTER — Ambulatory Visit: Payer: Medicare HMO

## 2020-08-17 DIAGNOSIS — Z1231 Encounter for screening mammogram for malignant neoplasm of breast: Secondary | ICD-10-CM | POA: Diagnosis not present

## 2020-08-21 DIAGNOSIS — H9313 Tinnitus, bilateral: Secondary | ICD-10-CM | POA: Diagnosis not present

## 2020-08-21 DIAGNOSIS — K219 Gastro-esophageal reflux disease without esophagitis: Secondary | ICD-10-CM | POA: Diagnosis not present

## 2020-08-21 DIAGNOSIS — I10 Essential (primary) hypertension: Secondary | ICD-10-CM | POA: Diagnosis not present

## 2020-08-21 DIAGNOSIS — N951 Menopausal and female climacteric states: Secondary | ICD-10-CM | POA: Diagnosis not present

## 2020-08-21 DIAGNOSIS — D508 Other iron deficiency anemias: Secondary | ICD-10-CM | POA: Diagnosis not present

## 2020-08-21 DIAGNOSIS — E782 Mixed hyperlipidemia: Secondary | ICD-10-CM | POA: Diagnosis not present

## 2020-08-21 DIAGNOSIS — R350 Frequency of micturition: Secondary | ICD-10-CM | POA: Diagnosis not present

## 2020-08-21 DIAGNOSIS — E039 Hypothyroidism, unspecified: Secondary | ICD-10-CM | POA: Diagnosis not present

## 2020-08-21 DIAGNOSIS — Z800 Family history of malignant neoplasm of digestive organs: Secondary | ICD-10-CM | POA: Diagnosis not present

## 2020-08-21 DIAGNOSIS — Z0000 Encounter for general adult medical examination without abnormal findings: Secondary | ICD-10-CM | POA: Diagnosis not present

## 2020-08-21 DIAGNOSIS — F418 Other specified anxiety disorders: Secondary | ICD-10-CM | POA: Diagnosis not present

## 2020-09-08 DIAGNOSIS — F329 Major depressive disorder, single episode, unspecified: Secondary | ICD-10-CM | POA: Diagnosis not present

## 2020-09-08 DIAGNOSIS — E7800 Pure hypercholesterolemia, unspecified: Secondary | ICD-10-CM | POA: Diagnosis not present

## 2020-09-08 DIAGNOSIS — K219 Gastro-esophageal reflux disease without esophagitis: Secondary | ICD-10-CM | POA: Diagnosis not present

## 2020-09-08 DIAGNOSIS — D508 Other iron deficiency anemias: Secondary | ICD-10-CM | POA: Diagnosis not present

## 2020-09-08 DIAGNOSIS — I10 Essential (primary) hypertension: Secondary | ICD-10-CM | POA: Diagnosis not present

## 2020-09-08 DIAGNOSIS — E782 Mixed hyperlipidemia: Secondary | ICD-10-CM | POA: Diagnosis not present

## 2020-09-08 DIAGNOSIS — E039 Hypothyroidism, unspecified: Secondary | ICD-10-CM | POA: Diagnosis not present

## 2020-09-10 DIAGNOSIS — Z780 Asymptomatic menopausal state: Secondary | ICD-10-CM | POA: Diagnosis not present

## 2020-09-10 DIAGNOSIS — R6882 Decreased libido: Secondary | ICD-10-CM | POA: Diagnosis not present

## 2020-09-10 DIAGNOSIS — N951 Menopausal and female climacteric states: Secondary | ICD-10-CM | POA: Diagnosis not present

## 2020-09-10 DIAGNOSIS — Z01419 Encounter for gynecological examination (general) (routine) without abnormal findings: Secondary | ICD-10-CM | POA: Diagnosis not present

## 2020-09-16 ENCOUNTER — Other Ambulatory Visit: Payer: Self-pay | Admitting: Nurse Practitioner

## 2020-09-16 DIAGNOSIS — Z780 Asymptomatic menopausal state: Secondary | ICD-10-CM

## 2020-10-06 ENCOUNTER — Ambulatory Visit: Payer: Medicare HMO

## 2020-12-15 DIAGNOSIS — K219 Gastro-esophageal reflux disease without esophagitis: Secondary | ICD-10-CM | POA: Diagnosis not present

## 2020-12-15 DIAGNOSIS — R911 Solitary pulmonary nodule: Secondary | ICD-10-CM | POA: Diagnosis not present

## 2020-12-15 DIAGNOSIS — D126 Benign neoplasm of colon, unspecified: Secondary | ICD-10-CM | POA: Diagnosis not present

## 2020-12-15 DIAGNOSIS — E039 Hypothyroidism, unspecified: Secondary | ICD-10-CM | POA: Diagnosis not present

## 2020-12-15 DIAGNOSIS — D508 Other iron deficiency anemias: Secondary | ICD-10-CM | POA: Diagnosis not present

## 2020-12-15 DIAGNOSIS — F418 Other specified anxiety disorders: Secondary | ICD-10-CM | POA: Diagnosis not present

## 2020-12-15 DIAGNOSIS — Z23 Encounter for immunization: Secondary | ICD-10-CM | POA: Diagnosis not present

## 2020-12-15 DIAGNOSIS — I10 Essential (primary) hypertension: Secondary | ICD-10-CM | POA: Diagnosis not present

## 2020-12-17 ENCOUNTER — Other Ambulatory Visit: Payer: Self-pay | Admitting: Family Medicine

## 2020-12-17 DIAGNOSIS — R911 Solitary pulmonary nodule: Secondary | ICD-10-CM

## 2020-12-29 DIAGNOSIS — H43393 Other vitreous opacities, bilateral: Secondary | ICD-10-CM | POA: Diagnosis not present

## 2021-01-06 ENCOUNTER — Ambulatory Visit
Admission: RE | Admit: 2021-01-06 | Discharge: 2021-01-06 | Disposition: A | Discharge status: Home / Self Care | Payer: Medicare HMO | Source: Ambulatory Visit | Attending: Family Medicine | Admitting: Family Medicine

## 2021-01-06 ENCOUNTER — Other Ambulatory Visit: Payer: Self-pay

## 2021-01-06 DIAGNOSIS — R911 Solitary pulmonary nodule: Secondary | ICD-10-CM | POA: Diagnosis not present

## 2021-01-06 DIAGNOSIS — R918 Other nonspecific abnormal finding of lung field: Secondary | ICD-10-CM | POA: Diagnosis not present

## 2021-01-07 ENCOUNTER — Other Ambulatory Visit: Payer: Self-pay

## 2021-01-07 ENCOUNTER — Emergency Department (HOSPITAL_COMMUNITY)
Admission: EM | Admit: 2021-01-07 | Discharge: 2021-01-07 | Disposition: A | Discharge status: Home / Self Care | Payer: Medicare HMO | Attending: Emergency Medicine | Admitting: Emergency Medicine

## 2021-01-07 ENCOUNTER — Encounter (HOSPITAL_COMMUNITY): Payer: Self-pay

## 2021-01-07 DIAGNOSIS — R634 Abnormal weight loss: Secondary | ICD-10-CM | POA: Diagnosis not present

## 2021-01-07 DIAGNOSIS — Z87891 Personal history of nicotine dependence: Secondary | ICD-10-CM | POA: Insufficient documentation

## 2021-01-07 DIAGNOSIS — Z79899 Other long term (current) drug therapy: Secondary | ICD-10-CM | POA: Diagnosis not present

## 2021-01-07 DIAGNOSIS — E039 Hypothyroidism, unspecified: Secondary | ICD-10-CM | POA: Diagnosis not present

## 2021-01-07 DIAGNOSIS — I10 Essential (primary) hypertension: Secondary | ICD-10-CM | POA: Diagnosis not present

## 2021-01-07 DIAGNOSIS — R61 Generalized hyperhidrosis: Secondary | ICD-10-CM

## 2021-01-07 DIAGNOSIS — K5900 Constipation, unspecified: Secondary | ICD-10-CM | POA: Diagnosis not present

## 2021-01-07 DIAGNOSIS — Z7982 Long term (current) use of aspirin: Secondary | ICD-10-CM | POA: Insufficient documentation

## 2021-01-07 LAB — COMPREHENSIVE METABOLIC PANEL
ALT: 21 U/L (ref 0–44)
AST: 26 U/L (ref 15–41)
Albumin: 4.2 g/dL (ref 3.5–5.0)
Alkaline Phosphatase: 40 U/L (ref 38–126)
Anion gap: 9 (ref 5–15)
BUN: 13 mg/dL (ref 8–23)
CO2: 29 mmol/L (ref 22–32)
Calcium: 10 mg/dL (ref 8.9–10.3)
Chloride: 102 mmol/L (ref 98–111)
Creatinine, Ser: 0.67 mg/dL (ref 0.44–1.00)
GFR, Estimated: >60 mL/min (ref >60)
Glucose, Bld: 93 mg/dL (ref 70–99)
Potassium: 3.8 mmol/L (ref 3.5–5.1)
Sodium: 140 mmol/L (ref 135–145)
Total Bilirubin: 0.7 mg/dL (ref 0.3–1.2)
Total Protein: 8 g/dL (ref 6.5–8.1)

## 2021-01-07 LAB — CBC WITH DIFFERENTIAL/PLATELET
Abs Immature Granulocytes: 0.03 10*3/uL (ref 0.00–0.07)
Basophils Absolute: 0 10*3/uL (ref 0.0–0.1)
Basophils Relative: 1 %
Eosinophils Absolute: 0 10*3/uL (ref 0.0–0.5)
Eosinophils Relative: 0 %
HCT: 40.2 % (ref 36.0–46.0)
Hemoglobin: 12.6 g/dL (ref 12.0–15.0)
Immature Granulocytes: 1 %
Lymphocytes Relative: 28 %
Lymphs Abs: 1.6 10*3/uL (ref 0.7–4.0)
MCH: 29 pg (ref 26.0–34.0)
MCHC: 31.3 g/dL (ref 30.0–36.0)
MCV: 92.6 fL (ref 80.0–100.0)
Monocytes Absolute: 0.4 10*3/uL (ref 0.1–1.0)
Monocytes Relative: 8 %
Neutro Abs: 3.5 10*3/uL (ref 1.7–7.7)
Neutrophils Relative %: 62 %
Platelets: 271 10*3/uL (ref 150–400)
RBC: 4.34 MIL/uL (ref 3.87–5.11)
RDW: 12.3 % (ref 11.5–15.5)
WBC: 5.6 10*3/uL (ref 4.0–10.5)
nRBC: 0 % (ref 0.0–0.2)

## 2021-01-07 LAB — TSH: TSH: 2.368 u[IU]/mL (ref 0.350–4.500)

## 2021-01-07 LAB — URINALYSIS, ROUTINE W REFLEX MICROSCOPIC
Bilirubin Urine: NEGATIVE
Glucose, UA: NEGATIVE mg/dL
Hgb urine dipstick: NEGATIVE
Ketones, ur: NEGATIVE mg/dL
Leukocytes,Ua: NEGATIVE
Nitrite: NEGATIVE
Protein, ur: NEGATIVE mg/dL
Specific Gravity, Urine: >1.03 — ABNORMAL HIGH (ref 1.005–1.030)
pH: 6 (ref 5.0–8.0)

## 2021-01-07 LAB — LIPASE, BLOOD: Lipase: 30 U/L (ref 11–51)

## 2021-01-07 NOTE — Discharge Instructions (Addendum)
Review of your records reveal that you had a normal colonoscopy in 2017 by Dr. Wynetta Emery.  That is [facility] gastroenterology. Please call them for follow-up. Please follow-up with your primary care doctor Your labs including CBC, complete chemistry, and urinalysis as well as TSH were normal here in the emergency department today.

## 2021-01-07 NOTE — ED Provider Notes (Signed)
[facility] DEPT Provider Note   CSN: [id number] Arrival date & time: 01/07/21  1038     History Chief Complaint  Patient presents with   Fatigue   Constipation    Karen [name] is a 70 y.o. female.  HPI 70 year old female history of anemia, hypertension, family history of pancreatic cancer, lung cancer, ovarian cancer, presents today complaining of weight loss, night sweats, and hemorrhoids.  She states that she has attempted to lose some weight over the past year.  However, she has not been as actively attempting to lose weight but feels that she continues to lose weight.  She feels that her the past week she has been having episodes at night where she is hot and this is caused her to lose sleep.  She also has been having some problems with hemorrhoids.  She feels that these come out when she is having a bowel movement feels that she has been constipated.  She denies any headache, head injury, chest pain, dyspnea, nausea vomiting or diarrhea.  She is not having any UTI symptoms.  Her primary care doctor is Dr. Sheryn Bison.  She reports that she thinks she had a colonoscopy in the distant past but is unsure who did that. Reverted of colonoscopy in 2017 performed by Dr. Earle Gell was normal.    Past Medical History:  Diagnosis Date   Anemia YRS AGO   Anxiety    Arthritis    JOINT PAIN HIPS AND KNEE   Depression    NO MEDS SON DIED Oct 16, 2015, TAKING THERAPY   Elevated cholesterol    Family history of breast cancer    Family history of lung cancer    Family history of ovarian cancer    Family history of pancreatic cancer    Family history of throat cancer    GERD (gastroesophageal reflux disease)    History of colonic polyps    Hypertension    Hypothyroidism    Thyroid disease    Tick bite 08/2015   UTI (urinary tract infection)    FINISHED ANTIBIOTICS 11-11-15    Patient Active Problem List   Diagnosis Date Noted   Genetic  testing 06/24/2019   Family history of pancreatic cancer    Family history of ovarian cancer    Family history of breast cancer    Family history of lung cancer    Family history of throat cancer     Past Surgical History:  Procedure Laterality Date   CESAREAN SECTION     X 4   COLONOSCOPY     COLONOSCOPY WITH PROPOFOL N/A 11/17/2015   Procedure: COLONOSCOPY WITH PROPOFOL;  Surgeon: Garlan Fair, MD;  Location: [facility];  Service: Endoscopy;  Laterality: N/A;   POLYPECTOMY       OB History   No obstetric history on file.     Family History  Problem Relation Age of Onset   Hypertension Mother    Pancreatic cancer Son 36   Ovarian cancer Maternal Grandmother 87   Pancreatic cancer Half-Brother 59       non-smoker   Lung cancer Maternal Aunt 72       non-smoker   Throat cancer Maternal Aunt        dx. 51s, non-smoker   Breast cancer Cousin 58   Colon cancer Neg Hx    Esophageal cancer Neg Hx    Colon polyps Neg Hx    Rectal cancer Neg Hx    Stomach  cancer Neg Hx     Social History   Tobacco Use   Smoking status: Former    Years: 10.00    Types: Cigarettes    Quit date: 04/18/2000    Years since quitting: 20.7   Smokeless tobacco: Never   Tobacco comments:    LIGHT SMOKER did dip in 20's   Vaping Use   Vaping Use: Never used  Substance Use Topics   Alcohol use: No   Drug use: Not Currently    Types: Marijuana    Comment: MARIJUANA  USE YEARS AGO IN COLLEGE    Home Medications Prior to Admission medications   Medication Sig Start Date End Date Taking? Authorizing Provider  acetaminophen (TYLENOL) 500 MG tablet Take 500 mg by mouth every 6 (six) hours as needed (pain).    [provider]  aspirin EC 81 MG tablet Take 81 mg by mouth daily. Swallow whole.    [provider]  atenolol (TENORMIN) 50 MG tablet Take 50 mg by mouth daily. for blood pressure 11/03/14   [provider]  cholecalciferol (VITAMIN D) 1000 UNITS tablet  Take 1,000 Units by mouth daily.    [provider]  ELDERBERRY PO Take 1 tablet by mouth daily.    [provider]  escitalopram (LEXAPRO) 20 MG tablet Take 20 mg by mouth daily.    [provider]  ezetimibe (ZETIA) 10 MG tablet Take 10 mg by mouth daily. 06/10/19   [provider]  fluticasone (FLONASE) 50 MCG/ACT nasal spray Place 1 spray into both nostrils daily as needed for allergies or rhinitis.    [provider]  Multiple Vitamin (MULTIVITAMIN WITH MINERALS) TABS tablet Take 1 tablet by mouth daily.    [provider]  omeprazole (PRILOSEC) 20 MG capsule TAKE 1 CAPSULE BY MOUTH EVERY DAY Patient not taking: Reported on 05/21/2020 02/14/20   Jackquline Denmark, MD  simethicone (GAS-X EXTRA STRENGTH) 125 MG chewable tablet Chew 125 mg by mouth every 6 (six) hours as needed (heartburn/indigestion).    [provider]  SYNTHROID 75 MCG tablet Take 75 mcg by mouth daily. 01/22/20   [provider]  traMADol (ULTRAM) 50 MG tablet Take 1-2 tablets (50-100 mg total) by mouth every 6 (six) hours as needed. Up to twice daily as needed for pain Patient taking differently: Take 50 mg by mouth 2 (two) times daily. 08/10/16   Alveda Reasons, MD  triamterene-hydrochlorothiazide (MAXZIDE) 75-50 MG per tablet Take 1 tablet by mouth daily. 10/27/14   [provider]  TURMERIC PO Take 1 capsule by mouth daily.    [provider]  vitamin C (ASCORBIC ACID) 500 MG tablet Take 500 mg by mouth daily.    [provider]    Allergies    Zoloft [sertraline hcl]  Review of Systems   Review of Systems  All other systems reviewed and are negative.  Physical Exam Updated Vital Signs BP 132/84   Pulse 68   Temp 99 F (37.2 C) (Oral)   Resp 16   Ht 1.575 m ('5\' 2"' )   Wt 74.8 kg   SpO2 100%   BMI 30.18 kg/m   Physical Exam Vitals and nursing note reviewed.  Constitutional:      General: She is not in acute  distress.    Appearance: She is well-developed.  HENT:     Head: Normocephalic and atraumatic.     Right Ear: External ear normal.     Left Ear:  External ear normal.     Nose: Nose normal.  Eyes:     Conjunctiva/sclera: Conjunctivae normal.     Pupils: Pupils are equal, round, and reactive to light.  Cardiovascular:     Rate and Rhythm: Normal rate and regular rhythm.  Pulmonary:     Effort: Pulmonary effort is normal.  Abdominal:     General: Abdomen is flat. Bowel sounds are normal. There is no distension.     Palpations: Abdomen is soft.     Tenderness: There is no abdominal tenderness.  Genitourinary:    Rectum: Normal.  Musculoskeletal:        General: Normal range of motion.     Cervical back: Normal range of motion and neck supple.  Skin:    General: Skin is warm and dry.  Neurological:     Mental Status: She is alert and oriented to person, place, and time.     Motor: No abnormal muscle tone.     Coordination: Coordination normal.  Psychiatric:        Behavior: Behavior normal.        Thought Content: Thought content normal.    ED Results / Procedures / Treatments   Labs (all labs ordered are listed, but only abnormal results are displayed) Labs Reviewed  URINALYSIS, ROUTINE W REFLEX MICROSCOPIC - Abnormal; Notable for the following components:      Result Value   Specific Gravity, Urine >1.030 (*)    All other components within normal limits  COMPREHENSIVE METABOLIC PANEL  CBC WITH DIFFERENTIAL/PLATELET  LIPASE, BLOOD  TSH    EKG None  Radiology CT CHEST WO CONTRAST  Result Date: 01/07/2021 CLINICAL DATA:  Pulmonary nodule R91.1 (ICD-10-CM).  Follow-up. EXAM: CT CHEST WITHOUT CONTRAST TECHNIQUE: Multidetector CT imaging of the chest was performed following the standard protocol without IV contrast. COMPARISON:  CT chest October 03, 2018. FINDINGS: Cardiovascular: No significant vascular findings. Normal heart size. No pericardial effusion.  Mediastinum/Nodes: No change in prominent although not pathologically enlarged mediastinal and AP window lymph nodes. Esophagus and trachea demonstrate no significant findings. Stable probable 1 cm nodule in the posterior right thyroid, no follow-up imaging recommended (ref: J Am Coll Radiol. 2015 Feb;12(2): 143-50). Lungs/Pleura: No change in an 8 mm triangular nodule in the superior segment of the left lower lobe (series 5, image 72). Similar 2 mm calcified granuloma in the left lower lobe. Similar 2 mm pulmonary nodule in the left upper lobe (series 5, image 33). Similar 3 mm ground-glass pulmonary nodule in the right lower lobe (series 5, image 85). Similar additional 2 mm ground-glass nodule in the more lateral right lower lobe (series 5, image 82). When accounting for differences in technique, similar 5 mm ground-glass nodule in the posterior aspect of the right upper lobe that in retrospect was present on more remote 2019 prior and appeared similar then (series 3, image 57 on that study). Similar tiny linear area of probable scarring in the right upper lobe (series 5, image 40). Similar 2 mm ground-glass nodule in the lateral right middle lobe (series 5, image 76). No consolidation. No pleural effusions or pneumothorax. Upper Abdomen: No acute abnormality. Musculoskeletal: No fracture is seen. IMPRESSION: Similar size of multiple small solid and ground-glass pulmonary nodules, the largest measuring 8 mm as detailed above. Given long-term stability since 2019, these nodules are likely benign. Electronically Signed   By: Margaretha Sheffield M.D.   On: 01/07/2021 08:36    Procedures Procedures   Medications Ordered in ED  Medications - No data to display  ED Course  I have reviewed the triage vital signs and the nursing notes.  Pertinent labs & imaging results that were available during my care of the patient were reviewed by me and considered in my medical decision making (see chart for details).     MDM Rules/Calculators/A&P                           70 year old female presents today complaining of night sweats and weight loss.  Labs reviewed.  Normal CBC c-Met, urinalysis, and TSH.  Exam with a soft nontender abdomen with no indication for further imaging here in the emergency department. Patient has been followed by [facility].  She is given referral to follow-up with them. Final Clinical Impression(s) / ED Diagnoses Final diagnoses:  Night sweats  Weight loss    Rx / DC Orders ED Discharge Orders     None        Pattricia Boss, MD 01/07/21 1529

## 2021-01-07 NOTE — ED Notes (Signed)
Pt ambulatory in ED lobby. 

## 2021-01-07 NOTE — ED Provider Notes (Signed)
Emergency Medicine Provider Triage Evaluation Note  Karen [name] , a 70 y.o. female  was evaluated in triage.  Pt complains of feeling generally unwell.  Patient reports that she has been feeling as this over the last 2 to 3 months.  Patient states "my body is disintegrating."  Patient endorses night sweats, unexpected weight loss, fatigue, constipation and hemorrhoids.  Patient endorses family history of pancreatic cancer.  Review of Systems  Positive: night sweats, unexpected weight loss, fatigue, constipation and hemorrhoids Negative: Fever, chills  Physical Exam  BP (!) 148/87 (BP Location: Left Arm)   Pulse 91   Temp 99 F (37.2 C) (Oral)   Resp 18   Ht 5\' 2"  (1.575 m)   Wt 74.8 kg   SpO2 100%   BMI 30.18 kg/m  Gen:   Awake, no distress   Resp:  Normal effort, lungs clear to auscultation bilaterally MSK:   Moves extremities without difficulty  Other:  Abdomen soft, nondistended, generalized tenderness throughout abdomen.  Medical Decision Making  Medically screening exam initiated at 12:21 PM.  Appropriate orders placed.  Karen [name] was informed that the remainder of the evaluation will be completed by another provider, this initial triage assessment does not replace that evaluation, and the importance of remaining in the ED until their evaluation is complete.  The patient appears stable so that the remainder of the work up may be completed by another provider.      Loni Beckwith, PA-C 01/07/21 1223    Pattricia Boss, MD 01/07/21 [phone]

## 2021-01-07 NOTE — ED Notes (Signed)
Urine specimen/culture obtained, placed in triage collection bin, holding for order. 

## 2021-01-07 NOTE — ED Triage Notes (Signed)
Pt reports fatigue, constipation, hemorrhoids, and feeling generally unwell for months now. Pt reports extensive family hx of cancer and wants to be checked out just incase.

## 2021-01-12 ENCOUNTER — Telehealth: Payer: Self-pay

## 2021-01-12 DIAGNOSIS — I10 Essential (primary) hypertension: Secondary | ICD-10-CM | POA: Diagnosis not present

## 2021-01-12 DIAGNOSIS — E039 Hypothyroidism, unspecified: Secondary | ICD-10-CM | POA: Diagnosis not present

## 2021-01-12 DIAGNOSIS — Z800 Family history of malignant neoplasm of digestive organs: Secondary | ICD-10-CM | POA: Diagnosis not present

## 2021-01-12 DIAGNOSIS — R634 Abnormal weight loss: Secondary | ICD-10-CM | POA: Diagnosis not present

## 2021-01-12 DIAGNOSIS — K649 Unspecified hemorrhoids: Secondary | ICD-10-CM | POA: Diagnosis not present

## 2021-01-12 DIAGNOSIS — F418 Other specified anxiety disorders: Secondary | ICD-10-CM | POA: Diagnosis not present

## 2021-01-12 DIAGNOSIS — G894 Chronic pain syndrome: Secondary | ICD-10-CM | POA: Diagnosis not present

## 2021-01-12 NOTE — Telephone Encounter (Signed)
The pt has a follow up with Dr Ardis Hughs on 10/28 and wishes to discuss at that time.

## 2021-01-12 NOTE — Telephone Encounter (Signed)
Thanks for update. GM 

## 2021-01-12 NOTE — Telephone Encounter (Signed)
-----   Message from Irving Copas., MD sent at 01/12/2021  3:34 PM EDT ----- Regarding: High risk pancreas cancer screening cohort Tru Leopard, This patient was scheduled with DJ in February 2022 for EUS but canceled. She has not rescheduled. Would be worthwhile when you have opportunity to try and reach her and see if she is willing to move forward with EUS or not. If she is, please update Dr. Bryan Lemma and the provider who will be doing EUS. Thanks. GM

## 2021-01-22 ENCOUNTER — Other Ambulatory Visit: Payer: Self-pay | Admitting: Family Medicine

## 2021-01-22 DIAGNOSIS — R634 Abnormal weight loss: Secondary | ICD-10-CM

## 2021-02-08 DIAGNOSIS — F332 Major depressive disorder, recurrent severe without psychotic features: Secondary | ICD-10-CM | POA: Diagnosis not present

## 2021-02-10 ENCOUNTER — Other Ambulatory Visit: Payer: Medicare HMO

## 2021-02-11 ENCOUNTER — Other Ambulatory Visit: Payer: Self-pay

## 2021-02-11 ENCOUNTER — Ambulatory Visit
Admission: RE | Admit: 2021-02-11 | Discharge: 2021-02-11 | Disposition: A | Discharge status: Home / Self Care | Payer: Medicare HMO | Source: Ambulatory Visit | Attending: Family Medicine | Admitting: Family Medicine

## 2021-02-11 DIAGNOSIS — R634 Abnormal weight loss: Secondary | ICD-10-CM

## 2021-02-11 DIAGNOSIS — K838 Other specified diseases of biliary tract: Secondary | ICD-10-CM | POA: Diagnosis not present

## 2021-02-11 MED ORDER — GADOBENATE DIMEGLUMINE 529 MG/ML IV SOLN
14.0000 mL | Freq: Once | INTRAVENOUS | Status: AC | PRN
  Administered 2021-02-11: 14 mL via INTRAVENOUS

## 2021-02-12 ENCOUNTER — Encounter: Payer: Self-pay | Admitting: Gastroenterology

## 2021-02-12 ENCOUNTER — Ambulatory Visit (INDEPENDENT_AMBULATORY_CARE_PROVIDER_SITE_OTHER): Payer: Medicare HMO | Admitting: Gastroenterology

## 2021-02-12 VITALS — BP 130/80 | HR 88 | Ht 61.0 in | Wt 160.0 lb

## 2021-02-12 DIAGNOSIS — Z86010 Personal history of colonic polyps: Secondary | ICD-10-CM | POA: Diagnosis not present

## 2021-02-12 DIAGNOSIS — K5900 Constipation, unspecified: Secondary | ICD-10-CM

## 2021-02-12 DIAGNOSIS — Z800 Family history of malignant neoplasm of digestive organs: Secondary | ICD-10-CM | POA: Diagnosis not present

## 2021-02-12 MED ORDER — CITRUCEL PO POWD: 1.0000 | Freq: Every day | ORAL | Status: DC

## 2021-02-12 NOTE — Progress Notes (Signed)
HPI: This is a pleasant 70 year old woman referred by her primary care physician  This is the first time I am meeting her.  She elected to change care from Dr. Bryan Lemma to my care.  He has seen her for colonoscopies and upper endoscopy and also to discuss family history of pancreatic cancer.  Colonoscopy June 2021, indication constipation, findings skin tags, 2 subcentimeter adenomas were removed.  Diverticulosis in the left colon.  Hemorrhoids were noted.  She was recommended to have repeat colonoscopy at 7-year interval for routine surveillance.  EGD June 2021 indication dysphagia, findings esophagus was normal, GE junction was empirically dilated to 53 [place].  Mild inflammation in the stomach was biopsied and pathology showed no sign of H. pylori.  She was recommended to follow-up in the clinic in 6 months.  She never did that.  She has a significant family history of pancreatic cancer including 2 first-degree relatives who have been diagnosed with colon cancer and passed away from it.  This is a half brother and also son.  She discussed this with Dr. Bryan Lemma in 2021 and he recommended pancreatic cancer screening with alternating MRI with MRCP and endoscopic ultrasound.  She never went through with any of the endoscopic ultrasound recommendations.  She had MRI with MRCP May 2021 and it was completely normal.  She underwent another MRI with MRCP yesterday and the results are not back yet.  Blood work September 2022 normal CBC, normal complete metabolic profile, normal lipase.  Today she tells me that she needs a colonoscopy.  She completely forgot that she had 1 just about a year ago.  She believes she needs it because she has constipation still and intermittent problems with hemorrhoids.  She does have a bowel movement daily, she takes a pill laxative but still has to push and strain a lot to move her bowels.  She has never tried fiber supplements.  She is worried also because her son and  brother had pancreas cancer and died from that.  She did not recall that she had talked with my partner Dr. Bryan Lemma about this exact thing about a year and a half ago and she was recommended to have MRI with MRCP.  She underwent that and it was normal.  She forgot about that.  She also forgot to mention that she had an MRI and MRCP just yesterday morning ordered by her primary care physician.  Those results are not back yet.  We discussed that she had been recommended for an endoscopic ultrasound for high risk of pancreas cancer, pancreatic cancer screening.  She did not remember that.   Old Data Reviewed:     Review of systems: Pertinent positive and negative review of systems were noted in the above HPI section. All other review negative.   Past Medical History:  Diagnosis Date   Anemia YRS AGO   Anxiety    Arthritis    JOINT PAIN HIPS AND KNEE   Depression    NO MEDS SON DIED 2015-10-14, TAKING THERAPY   Elevated cholesterol    Family history of breast cancer    Family history of lung cancer    Family history of ovarian cancer    Family history of pancreatic cancer    Family history of throat cancer    GERD (gastroesophageal reflux disease)    History of colonic polyps    Hypertension    Hypothyroidism    Thyroid disease    Tick bite 08/2015  UTI (urinary tract infection)    FINISHED ANTIBIOTICS 11-11-15    Past Surgical History:  Procedure Laterality Date   CESAREAN SECTION     X 4   COLONOSCOPY     COLONOSCOPY WITH PROPOFOL N/A 11/17/2015   Procedure: COLONOSCOPY WITH PROPOFOL;  Surgeon: Garlan Fair, MD;  Location: [facility];  Service: Endoscopy;  Laterality: N/A;   POLYPECTOMY      Current Outpatient Medications  Medication Sig Dispense Refill   acetaminophen (TYLENOL) 500 MG tablet Take 500 mg by mouth every 6 (six) hours as needed (pain).     atenolol (TENORMIN) 50 MG tablet Take 50 mg by mouth daily. for blood pressure  3   cholecalciferol  (VITAMIN D) 1000 UNITS tablet Take 1,000 Units by mouth daily.     ELDERBERRY PO Take 1 tablet by mouth daily.     escitalopram (LEXAPRO) 20 MG tablet Take 20 mg by mouth daily.     ezetimibe (ZETIA) 10 MG tablet Take 10 mg by mouth daily.     fluticasone (FLONASE) 50 MCG/ACT nasal spray Place 1 spray into both nostrils daily as needed for allergies or rhinitis.     Multiple Vitamin (MULTIVITAMIN WITH MINERALS) TABS tablet Take 1 tablet by mouth daily.     SYNTHROID 75 MCG tablet Take 75 mcg by mouth daily.     traMADol (ULTRAM) 50 MG tablet Take 1-2 tablets (50-100 mg total) by mouth every 6 (six) hours as needed. Up to twice daily as needed for pain (Patient taking differently: Take 50 mg by mouth 2 (two) times daily.) 30 tablet 1   triamterene-hydrochlorothiazide (MAXZIDE) 75-50 MG per tablet Take 1 tablet by mouth daily.  3   TURMERIC PO Take 1 capsule by mouth daily.     vitamin C (ASCORBIC ACID) 500 MG tablet Take 500 mg by mouth daily.     No current facility-administered medications for this visit.    Allergies as of 02/12/2021 - Review Complete 02/12/2021  Allergen Reaction Noted   Zoloft [sertraline hcl] Other (See Comments) 12/31/2014    Family History  Problem Relation Age of Onset   Hypertension Mother    Pancreatic cancer Son 2   Ovarian cancer Maternal Grandmother 87   Pancreatic cancer Half-Brother 90       non-smoker   Lung cancer Maternal Aunt 72       non-smoker   Throat cancer Maternal Aunt        dx. 70s, non-smoker   Breast cancer Cousin 58   Colon cancer Neg Hx    Esophageal cancer Neg Hx    Colon polyps Neg Hx    Rectal cancer Neg Hx    Stomach cancer Neg Hx     Social History   Socioeconomic History   Marital status: Married    Spouse name: Not on file   Number of children: 4   Years of education: Not on file   Highest education level: Not on file  Occupational History   Occupation: Retired  Tobacco Use   Smoking status: Former    Years:  10.00    Types: Cigarettes    Quit date: 04/18/2000    Years since quitting: 20.8   Smokeless tobacco: Never   Tobacco comments:    LIGHT SMOKER did dip in 20's   Vaping Use   Vaping Use: Never used  Substance and Sexual Activity   Alcohol use: No   Drug use: Not Currently    Types: Marijuana  Comment: MARIJUANA  USE YEARS AGO IN COLLEGE   Sexual activity: Not on file  Other Topics Concern   Not on file  Social History Narrative   Not on file   Social Determinants of Health   Financial Resource Strain: Not on file  Food Insecurity: Not on file  Transportation Needs: Not on file  Physical Activity: Not on file  Stress: Not on file  Social Connections: Not on file  Intimate Partner Violence: Not on file     Physical Exam: Ht 5\' 1"  (1.549 m)   Wt 160 lb (72.6 kg)   BMI 30.23 kg/m  Constitutional: generally well-appearing Psychiatric: alert and oriented x3 Eyes: extraocular movements intact Mouth: oral pharynx moist, no lesions Neck: supple no lymphadenopathy Cardiovascular: heart regular rate and rhythm Lungs: clear to auscultation bilaterally Abdomen: soft, nontender, nondistended, no obvious ascites, no peritoneal signs, normal bowel sounds Extremities: no lower extremity edema bilaterally Skin: no lesions on visible extremities   Assessment and plan: 70 y.o. female with chronic constipation, personal history of adenomatous polyps, significant family history of pancreatic cancer, significant memory difficulties  I am concerned about her memory problems.  She completely forgot that she had a colonoscopy and upper endoscopy just a year ago.  She also completely forgot that she had an MRI a year ago ordered by one of my partners for high risk pancreas cancer.  She failed to mention to me that she had an MRI ordered by her primary care physician of her abdomen yesterday.  I am not sure if this is forgetful or manipulative behavior.  I reassured her that she does not  need a repeat colonoscopy now since she just had one about a year and a half ago.  She is set for recall colonoscopy for personal history of polyps in 2028 and I agree with that recommendation.  As for her chronic constipation I recommended that she start taking a fiber supplement once daily as this is usually quite helpful for her issue.  This should help her constipation and in turn this will help her difficulty with her hemorrhoids.  As for her high risk for pancreatic cancer given significant family history of pancreatic cancer, we will obtain the results of yesterday's MRI with MRCP and I will let her know my opinion of it when the results are read.  We will put her in for endoscopic ultrasound evaluation of the pancreas in 6 months from now.  Please see the "Patient Instructions" section for addition details about the plan.   Owens Loffler, MD [facility] 02/12/2021, 9:29 AM  Cc: Aura Dials, MD  Total time on date of encounter was 45 minutes (this included time spent preparing to see the patient reviewing records; obtaining and/or reviewing separately obtained history; performing a medically appropriate exam and/or evaluation; counseling and educating the patient and family if present; ordering medications, tests or procedures if applicable; and documenting clinical information in the health record).

## 2021-02-12 NOTE — Patient Instructions (Signed)
If you are age 67 or older, your body mass index should be between 23-30. Your Body mass index is 30.23 kg/m. If this is out of the aforementioned range listed, please consider follow up with your Primary Care Provider. ________________________________________________________  The [facility] providers would like to encourage you to use [facility] to communicate with providers for non-urgent requests or questions.  Due to long hold times on the telephone, sending your provider a message by [facility] may be a faster and more efficient way to get a response.  Please allow 48 business hours for a response.  Please remember that this is for non-urgent requests.  _______________________________________________________  Please start taking citrucel (orange flavored) powder fiber supplement.  This may cause some bloating at first but that usually goes away. Begin with a small spoonful and work your way up to a large, heaping spoonful daily over a week.  We will contact you in 6 months (April 2023) to schedule upper endoscopic ultrasound.  Once MRI results are in the computer, they will be reviewed and recommendations will be made.  Thank you for entrusting me with your care and choosing [facility].  Dr Ardis Hughs

## 2021-02-16 ENCOUNTER — Ambulatory Visit: Payer: Self-pay | Admitting: Adult Health

## 2021-02-17 ENCOUNTER — Telehealth: Payer: Self-pay

## 2021-02-17 NOTE — Telephone Encounter (Signed)
Patient notified of MRI with MRCP results per Dr Ardis Hughs.  Patient advised that we will put her in for an upper endoscopic ultrasound in May 2023.  Patient agreed to plan and verbalized understanding.  No further questions.

## 2021-02-17 NOTE — Telephone Encounter (Signed)
-----   Message from Milus Banister, MD sent at 02/16/2021  7:21 AM EDT ----- Regarding: RE: MRI/MRCP results Got it, thanks.  Can you reach out to the patient.  Her MRI with MRCP shows nothing concerning about her pancreas.  Please put in recall for upper endoscopic ultrasound for high risk pancreatic cancer staging May 2023.  Thank you     ----- Message ----- From: Stevan Born, CMA Sent: 02/15/2021   9:09 AM EDT To: Milus Banister, MD Subject: MRI/MRCP results                               Just a reminder MRI/MRCP results are in for your review.  Thank you

## 2021-02-25 DIAGNOSIS — F332 Major depressive disorder, recurrent severe without psychotic features: Secondary | ICD-10-CM | POA: Diagnosis not present

## 2021-03-29 DIAGNOSIS — F332 Major depressive disorder, recurrent severe without psychotic features: Secondary | ICD-10-CM | POA: Diagnosis not present

## 2021-03-31 DIAGNOSIS — K219 Gastro-esophageal reflux disease without esophagitis: Secondary | ICD-10-CM | POA: Diagnosis not present

## 2021-03-31 DIAGNOSIS — I10 Essential (primary) hypertension: Secondary | ICD-10-CM | POA: Diagnosis not present

## 2021-03-31 DIAGNOSIS — E7800 Pure hypercholesterolemia, unspecified: Secondary | ICD-10-CM | POA: Diagnosis not present

## 2021-03-31 DIAGNOSIS — F418 Other specified anxiety disorders: Secondary | ICD-10-CM | POA: Diagnosis not present

## 2021-03-31 DIAGNOSIS — G8929 Other chronic pain: Secondary | ICD-10-CM | POA: Diagnosis not present

## 2021-03-31 DIAGNOSIS — E039 Hypothyroidism, unspecified: Secondary | ICD-10-CM | POA: Diagnosis not present

## 2021-03-31 DIAGNOSIS — M25561 Pain in right knee: Secondary | ICD-10-CM | POA: Diagnosis not present

## 2021-04-26 DIAGNOSIS — E7800 Pure hypercholesterolemia, unspecified: Secondary | ICD-10-CM | POA: Diagnosis not present

## 2021-04-26 DIAGNOSIS — E039 Hypothyroidism, unspecified: Secondary | ICD-10-CM | POA: Diagnosis not present

## 2021-04-26 DIAGNOSIS — Z0000 Encounter for general adult medical examination without abnormal findings: Secondary | ICD-10-CM | POA: Diagnosis not present

## 2021-05-04 DIAGNOSIS — I10 Essential (primary) hypertension: Secondary | ICD-10-CM | POA: Diagnosis not present

## 2021-05-04 DIAGNOSIS — K219 Gastro-esophageal reflux disease without esophagitis: Secondary | ICD-10-CM | POA: Diagnosis not present

## 2021-05-04 DIAGNOSIS — Z1212 Encounter for screening for malignant neoplasm of rectum: Secondary | ICD-10-CM | POA: Diagnosis not present

## 2021-05-04 DIAGNOSIS — E7800 Pure hypercholesterolemia, unspecified: Secondary | ICD-10-CM | POA: Diagnosis not present

## 2021-05-04 DIAGNOSIS — M25561 Pain in right knee: Secondary | ICD-10-CM | POA: Diagnosis not present

## 2021-05-04 DIAGNOSIS — E559 Vitamin D deficiency, unspecified: Secondary | ICD-10-CM | POA: Diagnosis not present

## 2021-05-04 DIAGNOSIS — E039 Hypothyroidism, unspecified: Secondary | ICD-10-CM | POA: Diagnosis not present

## 2021-05-04 DIAGNOSIS — Z0000 Encounter for general adult medical examination without abnormal findings: Secondary | ICD-10-CM | POA: Diagnosis not present

## 2021-05-04 DIAGNOSIS — F418 Other specified anxiety disorders: Secondary | ICD-10-CM | POA: Diagnosis not present

## 2021-05-04 DIAGNOSIS — G8929 Other chronic pain: Secondary | ICD-10-CM | POA: Diagnosis not present

## 2021-05-12 DIAGNOSIS — Z111 Encounter for screening for respiratory tuberculosis: Secondary | ICD-10-CM | POA: Diagnosis not present

## 2021-05-14 DIAGNOSIS — Z111 Encounter for screening for respiratory tuberculosis: Secondary | ICD-10-CM | POA: Diagnosis not present

## 2021-05-25 DIAGNOSIS — G8929 Other chronic pain: Secondary | ICD-10-CM | POA: Diagnosis not present

## 2021-05-25 DIAGNOSIS — E039 Hypothyroidism, unspecified: Secondary | ICD-10-CM | POA: Diagnosis not present

## 2021-05-25 DIAGNOSIS — M25561 Pain in right knee: Secondary | ICD-10-CM | POA: Diagnosis not present

## 2021-05-27 DIAGNOSIS — F332 Major depressive disorder, recurrent severe without psychotic features: Secondary | ICD-10-CM | POA: Diagnosis not present

## 2021-06-10 DIAGNOSIS — F332 Major depressive disorder, recurrent severe without psychotic features: Secondary | ICD-10-CM | POA: Diagnosis not present

## 2021-06-22 ENCOUNTER — Telehealth: Payer: Self-pay

## 2021-06-22 DIAGNOSIS — Z800 Family history of malignant neoplasm of digestive organs: Secondary | ICD-10-CM

## 2021-06-22 DIAGNOSIS — Z86010 Personal history of colonic polyps: Secondary | ICD-10-CM

## 2021-06-22 NOTE — Telephone Encounter (Signed)
Left message for patient to return call to schedule EUS at [facility]. Will continue efforts.  ?

## 2021-06-22 NOTE — Telephone Encounter (Signed)
-----   Message from [place] sent at 02/12/2021  9:51 AM EDT ----- ?Regarding: EUS ?Pt needs endoscopic ultrasound evaluation of the pancreas in 6 months (April 2023) With DJ ?? ? ?

## 2021-06-24 ENCOUNTER — Other Ambulatory Visit: Payer: Self-pay

## 2021-06-24 DIAGNOSIS — Z800 Family history of malignant neoplasm of digestive organs: Secondary | ICD-10-CM

## 2021-06-24 NOTE — Telephone Encounter (Signed)
Patient aware that she is scheduled for upper EUS on 08-19-21 at 7:30am at [facility].  Patient prep instructions discussed.  Patient agreed to plan and verbalized understanding.  No further questions.  ?

## 2021-07-02 DIAGNOSIS — E039 Hypothyroidism, unspecified: Secondary | ICD-10-CM | POA: Diagnosis not present

## 2021-07-07 DIAGNOSIS — M25561 Pain in right knee: Secondary | ICD-10-CM | POA: Diagnosis not present

## 2021-07-07 DIAGNOSIS — E039 Hypothyroidism, unspecified: Secondary | ICD-10-CM | POA: Diagnosis not present

## 2021-07-07 DIAGNOSIS — M2550 Pain in unspecified joint: Secondary | ICD-10-CM | POA: Diagnosis not present

## 2021-07-07 DIAGNOSIS — G8929 Other chronic pain: Secondary | ICD-10-CM | POA: Diagnosis not present

## 2021-07-07 DIAGNOSIS — I10 Essential (primary) hypertension: Secondary | ICD-10-CM | POA: Diagnosis not present

## 2021-07-20 DIAGNOSIS — M1180 Other specified crystal arthropathies, unspecified site: Secondary | ICD-10-CM | POA: Diagnosis not present

## 2021-07-20 DIAGNOSIS — M1120 Other chondrocalcinosis, unspecified site: Secondary | ICD-10-CM

## 2021-07-20 DIAGNOSIS — M179 Osteoarthritis of knee, unspecified: Secondary | ICD-10-CM

## 2021-07-20 DIAGNOSIS — M170 Bilateral primary osteoarthritis of knee: Secondary | ICD-10-CM | POA: Diagnosis not present

## 2021-07-20 DIAGNOSIS — M25561 Pain in right knee: Secondary | ICD-10-CM | POA: Diagnosis not present

## 2021-07-20 DIAGNOSIS — M545 Low back pain, unspecified: Secondary | ICD-10-CM | POA: Diagnosis not present

## 2021-07-20 HISTORY — DX: Other chondrocalcinosis, unspecified site: M11.20

## 2021-07-20 HISTORY — DX: Osteoarthritis of knee, unspecified: M17.9

## 2021-08-16 ENCOUNTER — Encounter (HOSPITAL_COMMUNITY): Payer: Self-pay | Admitting: Gastroenterology

## 2021-08-16 NOTE — Progress Notes (Signed)
Attempted to obtain medical history via telephone, unable to reach at this time. I left a voicemail to return pre surgical testing department's phone call.  

## 2021-08-18 NOTE — Progress Notes (Signed)
Called patient to confirm appointment for tomorrow morning . Patient stated she was going to have to reschedule as her niece passed away and she needed to attend the funeral. She stated she would call Dr. Ardis Hughs office to reschedule.  ?

## 2021-08-19 ENCOUNTER — Encounter (HOSPITAL_COMMUNITY): Admission: RE | Payer: Self-pay | Source: Home / Self Care

## 2021-08-19 ENCOUNTER — Ambulatory Visit (HOSPITAL_COMMUNITY): Admission: RE | Admit: 2021-08-19 | Payer: Medicare HMO | Source: Home / Self Care | Admitting: Gastroenterology

## 2021-08-19 SURGERY — UPPER ESOPHAGEAL ENDOSCOPIC ULTRASOUND (EUS)
Anesthesia: Monitor Anesthesia Care

## 2021-10-01 DIAGNOSIS — H43393 Other vitreous opacities, bilateral: Secondary | ICD-10-CM | POA: Diagnosis not present

## 2021-10-11 DIAGNOSIS — Z0100 Encounter for examination of eyes and vision without abnormal findings: Secondary | ICD-10-CM | POA: Diagnosis not present

## 2021-11-25 DIAGNOSIS — F418 Other specified anxiety disorders: Secondary | ICD-10-CM | POA: Diagnosis not present

## 2021-11-25 DIAGNOSIS — I10 Essential (primary) hypertension: Secondary | ICD-10-CM | POA: Diagnosis not present

## 2021-11-25 DIAGNOSIS — M159 Polyosteoarthritis, unspecified: Secondary | ICD-10-CM | POA: Diagnosis not present

## 2021-11-25 DIAGNOSIS — E7800 Pure hypercholesterolemia, unspecified: Secondary | ICD-10-CM | POA: Diagnosis not present

## 2021-11-26 ENCOUNTER — Other Ambulatory Visit: Payer: Self-pay | Admitting: Registered Nurse

## 2021-11-26 DIAGNOSIS — E7800 Pure hypercholesterolemia, unspecified: Secondary | ICD-10-CM

## 2021-12-09 DIAGNOSIS — M159 Polyosteoarthritis, unspecified: Secondary | ICD-10-CM | POA: Diagnosis not present

## 2021-12-09 DIAGNOSIS — F418 Other specified anxiety disorders: Secondary | ICD-10-CM | POA: Diagnosis not present

## 2021-12-10 ENCOUNTER — Other Ambulatory Visit: Payer: Self-pay | Admitting: Registered Nurse

## 2021-12-10 DIAGNOSIS — Z1231 Encounter for screening mammogram for malignant neoplasm of breast: Secondary | ICD-10-CM

## 2021-12-14 ENCOUNTER — Ambulatory Visit: Payer: Medicare HMO

## 2021-12-15 ENCOUNTER — Ambulatory Visit
Admission: RE | Admit: 2021-12-15 | Discharge: 2021-12-15 | Disposition: A | Discharge status: Home / Self Care | Payer: Medicare HMO | Source: Ambulatory Visit | Attending: Registered Nurse | Admitting: Registered Nurse

## 2021-12-15 DIAGNOSIS — Z1231 Encounter for screening mammogram for malignant neoplasm of breast: Secondary | ICD-10-CM

## 2021-12-25 DIAGNOSIS — F4323 Adjustment disorder with mixed anxiety and depressed mood: Secondary | ICD-10-CM | POA: Diagnosis not present

## 2022-01-01 DIAGNOSIS — F4323 Adjustment disorder with mixed anxiety and depressed mood: Secondary | ICD-10-CM | POA: Diagnosis not present

## 2022-01-11 ENCOUNTER — Ambulatory Visit
Admission: RE | Admit: 2022-01-11 | Discharge: 2022-01-11 | Disposition: A | Discharge status: Home / Self Care | Payer: No Typology Code available for payment source | Source: Ambulatory Visit | Attending: Registered Nurse | Admitting: Registered Nurse

## 2022-01-11 DIAGNOSIS — I700 Atherosclerosis of aorta: Secondary | ICD-10-CM | POA: Diagnosis not present

## 2022-01-11 DIAGNOSIS — E7800 Pure hypercholesterolemia, unspecified: Secondary | ICD-10-CM

## 2022-01-18 DIAGNOSIS — L309 Dermatitis, unspecified: Secondary | ICD-10-CM | POA: Diagnosis not present

## 2022-01-18 DIAGNOSIS — I700 Atherosclerosis of aorta: Secondary | ICD-10-CM | POA: Diagnosis not present

## 2022-01-18 DIAGNOSIS — M159 Polyosteoarthritis, unspecified: Secondary | ICD-10-CM | POA: Diagnosis not present

## 2022-01-18 DIAGNOSIS — Z800 Family history of malignant neoplasm of digestive organs: Secondary | ICD-10-CM | POA: Diagnosis not present

## 2022-01-18 DIAGNOSIS — F418 Other specified anxiety disorders: Secondary | ICD-10-CM | POA: Diagnosis not present

## 2022-01-18 DIAGNOSIS — E7800 Pure hypercholesterolemia, unspecified: Secondary | ICD-10-CM | POA: Diagnosis not present

## 2022-02-14 DIAGNOSIS — M159 Polyosteoarthritis, unspecified: Secondary | ICD-10-CM | POA: Diagnosis not present

## 2022-02-14 DIAGNOSIS — E7800 Pure hypercholesterolemia, unspecified: Secondary | ICD-10-CM | POA: Diagnosis not present

## 2022-02-14 DIAGNOSIS — F331 Major depressive disorder, recurrent, moderate: Secondary | ICD-10-CM | POA: Diagnosis not present

## 2022-02-23 ENCOUNTER — Ambulatory Visit
Admission: RE | Admit: 2022-02-23 | Discharge: 2022-02-23 | Disposition: A | Discharge status: Home / Self Care | Payer: Medicare HMO | Source: Ambulatory Visit | Attending: Nurse Practitioner | Admitting: Nurse Practitioner

## 2022-02-23 ENCOUNTER — Other Ambulatory Visit: Payer: Self-pay | Admitting: Nurse Practitioner

## 2022-02-23 DIAGNOSIS — M545 Low back pain, unspecified: Secondary | ICD-10-CM | POA: Diagnosis not present

## 2022-02-23 DIAGNOSIS — M4316 Spondylolisthesis, lumbar region: Secondary | ICD-10-CM | POA: Diagnosis not present

## 2022-02-23 DIAGNOSIS — Z79891 Long term (current) use of opiate analgesic: Secondary | ICD-10-CM | POA: Diagnosis not present

## 2022-02-23 DIAGNOSIS — M1711 Unilateral primary osteoarthritis, right knee: Secondary | ICD-10-CM | POA: Diagnosis not present

## 2022-02-23 DIAGNOSIS — M25561 Pain in right knee: Secondary | ICD-10-CM

## 2022-02-23 DIAGNOSIS — G894 Chronic pain syndrome: Secondary | ICD-10-CM | POA: Diagnosis not present

## 2022-03-23 ENCOUNTER — Telehealth: Payer: Self-pay

## 2022-03-23 DIAGNOSIS — I700 Atherosclerosis of aorta: Secondary | ICD-10-CM | POA: Diagnosis not present

## 2022-03-23 DIAGNOSIS — G894 Chronic pain syndrome: Secondary | ICD-10-CM | POA: Diagnosis not present

## 2022-03-23 DIAGNOSIS — F331 Major depressive disorder, recurrent, moderate: Secondary | ICD-10-CM | POA: Diagnosis not present

## 2022-03-23 DIAGNOSIS — M545 Low back pain, unspecified: Secondary | ICD-10-CM | POA: Diagnosis not present

## 2022-03-23 DIAGNOSIS — M47816 Spondylosis without myelopathy or radiculopathy, lumbar region: Secondary | ICD-10-CM | POA: Diagnosis not present

## 2022-03-23 DIAGNOSIS — M1711 Unilateral primary osteoarthritis, right knee: Secondary | ICD-10-CM | POA: Diagnosis not present

## 2022-03-23 DIAGNOSIS — Z79891 Long term (current) use of opiate analgesic: Secondary | ICD-10-CM | POA: Diagnosis not present

## 2022-03-23 DIAGNOSIS — R413 Other amnesia: Secondary | ICD-10-CM | POA: Diagnosis not present

## 2022-03-23 DIAGNOSIS — M25561 Pain in right knee: Secondary | ICD-10-CM | POA: Diagnosis not present

## 2022-03-23 NOTE — Patient Outreach (Signed)
  Care Coordination   In Person Provider Office Visit Note   03/23/2022 Name: Karen [name] MRN: [id number] DOB: 01/15/1951  Karen [name] is a 71 y.o. year old female who sees Aura Dials, MD for primary care. I engaged with Randa Evens in the providers office today.  What matters to the patients health and wellness today?  none    Goals Addressed             This Visit's Progress    COMPLETED: Care Coordination Activities-No follow up required       Care Coordination Interventions: Advised patient to [facility] services and support. Patient decline.        Patient Stated          SDOH assessments and interventions completed:  Yes  SDOH Interventions Today    Flowsheet Row Most Recent Value  SDOH Interventions   Food Insecurity Interventions Intervention Not Indicated  Transportation Interventions Intervention Not Indicated        Care Coordination Interventions:  Yes, provided   Follow up plan: No further intervention required.   Encounter Outcome:  Pt. Visit Completed   Jone Baseman, RN, MSN [place] [facility] Care Management Coordinator Direct Line [phone]

## 2022-03-23 NOTE — Patient Instructions (Signed)
Visit Information  Thank you for taking time to visit with me today. Please don't hesitate to contact me if I can be of assistance to you.   Following are the goals we discussed today:   Goals Addressed             This Visit's Progress    COMPLETED: Care Coordination Activities-No follow up required       Care Coordination Interventions: Advised patient to [facility] services and support. Patient decline.        Patient Stated           If you are experiencing a Mental Health or [place] or need someone to talk to, please call the Suicide and Crisis Lifeline: 988   Patient verbalizes understanding of instructions and care plan provided today and agrees to view in [place]. Active MyChart status and patient understanding of how to access instructions and care plan via MyChart confirmed with patient.     No further follow up required: decline  Jone Baseman, RN, MSN [place] [facility] Care Management Coordinator Direct Line [phone]

## 2022-04-29 DIAGNOSIS — E039 Hypothyroidism, unspecified: Secondary | ICD-10-CM | POA: Diagnosis not present

## 2022-04-29 DIAGNOSIS — E559 Vitamin D deficiency, unspecified: Secondary | ICD-10-CM | POA: Diagnosis not present

## 2022-04-29 DIAGNOSIS — Z0000 Encounter for general adult medical examination without abnormal findings: Secondary | ICD-10-CM | POA: Diagnosis not present

## 2022-04-29 DIAGNOSIS — E7800 Pure hypercholesterolemia, unspecified: Secondary | ICD-10-CM | POA: Diagnosis not present

## 2022-05-04 DIAGNOSIS — M545 Low back pain, unspecified: Secondary | ICD-10-CM | POA: Diagnosis not present

## 2022-05-04 DIAGNOSIS — G894 Chronic pain syndrome: Secondary | ICD-10-CM | POA: Diagnosis not present

## 2022-05-04 DIAGNOSIS — M47816 Spondylosis without myelopathy or radiculopathy, lumbar region: Secondary | ICD-10-CM | POA: Diagnosis not present

## 2022-05-04 DIAGNOSIS — M1711 Unilateral primary osteoarthritis, right knee: Secondary | ICD-10-CM | POA: Diagnosis not present

## 2022-05-04 DIAGNOSIS — Z79891 Long term (current) use of opiate analgesic: Secondary | ICD-10-CM | POA: Diagnosis not present

## 2022-05-04 DIAGNOSIS — M25561 Pain in right knee: Secondary | ICD-10-CM | POA: Diagnosis not present

## 2022-05-06 DIAGNOSIS — I10 Essential (primary) hypertension: Secondary | ICD-10-CM | POA: Diagnosis not present

## 2022-05-06 DIAGNOSIS — E559 Vitamin D deficiency, unspecified: Secondary | ICD-10-CM | POA: Diagnosis not present

## 2022-05-06 DIAGNOSIS — E7800 Pure hypercholesterolemia, unspecified: Secondary | ICD-10-CM | POA: Diagnosis not present

## 2022-05-06 DIAGNOSIS — I700 Atherosclerosis of aorta: Secondary | ICD-10-CM | POA: Diagnosis not present

## 2022-05-06 DIAGNOSIS — Z23 Encounter for immunization: Secondary | ICD-10-CM | POA: Diagnosis not present

## 2022-05-06 DIAGNOSIS — R7301 Impaired fasting glucose: Secondary | ICD-10-CM | POA: Diagnosis not present

## 2022-05-06 DIAGNOSIS — E039 Hypothyroidism, unspecified: Secondary | ICD-10-CM | POA: Diagnosis not present

## 2022-05-06 DIAGNOSIS — Z0000 Encounter for general adult medical examination without abnormal findings: Secondary | ICD-10-CM | POA: Diagnosis not present

## 2022-06-01 DIAGNOSIS — E039 Hypothyroidism, unspecified: Secondary | ICD-10-CM | POA: Diagnosis not present

## 2022-06-01 DIAGNOSIS — R7301 Impaired fasting glucose: Secondary | ICD-10-CM | POA: Diagnosis not present

## 2022-06-07 DIAGNOSIS — Z79891 Long term (current) use of opiate analgesic: Secondary | ICD-10-CM | POA: Diagnosis not present

## 2022-06-07 DIAGNOSIS — M1711 Unilateral primary osteoarthritis, right knee: Secondary | ICD-10-CM | POA: Diagnosis not present

## 2022-06-07 DIAGNOSIS — M25561 Pain in right knee: Secondary | ICD-10-CM | POA: Diagnosis not present

## 2022-06-07 DIAGNOSIS — M545 Low back pain, unspecified: Secondary | ICD-10-CM | POA: Diagnosis not present

## 2022-06-07 DIAGNOSIS — G894 Chronic pain syndrome: Secondary | ICD-10-CM | POA: Diagnosis not present

## 2022-06-07 DIAGNOSIS — M47816 Spondylosis without myelopathy or radiculopathy, lumbar region: Secondary | ICD-10-CM | POA: Diagnosis not present

## 2022-06-08 DIAGNOSIS — I10 Essential (primary) hypertension: Secondary | ICD-10-CM | POA: Diagnosis not present

## 2022-06-08 DIAGNOSIS — R413 Other amnesia: Secondary | ICD-10-CM | POA: Diagnosis not present

## 2022-06-08 DIAGNOSIS — E7800 Pure hypercholesterolemia, unspecified: Secondary | ICD-10-CM | POA: Diagnosis not present

## 2022-06-08 DIAGNOSIS — E039 Hypothyroidism, unspecified: Secondary | ICD-10-CM | POA: Diagnosis not present

## 2022-06-23 ENCOUNTER — Ambulatory Visit: Payer: Medicare HMO | Admitting: Physician Assistant

## 2022-06-23 ENCOUNTER — Encounter: Payer: Self-pay | Admitting: Physician Assistant

## 2022-06-23 ENCOUNTER — Ambulatory Visit: Payer: Medicare HMO

## 2022-06-23 ENCOUNTER — Other Ambulatory Visit (INDEPENDENT_AMBULATORY_CARE_PROVIDER_SITE_OTHER): Payer: Medicare HMO

## 2022-06-23 VITALS — BP 134/79 | HR 95 | Resp 20 | Ht 61.0 in | Wt 144.0 lb

## 2022-06-23 DIAGNOSIS — F322 Major depressive disorder, single episode, severe without psychotic features: Secondary | ICD-10-CM | POA: Diagnosis not present

## 2022-06-23 DIAGNOSIS — R413 Other amnesia: Secondary | ICD-10-CM

## 2022-06-23 LAB — TSH: TSH: 2.87 u[IU]/mL (ref 0.35–5.50)

## 2022-06-23 LAB — VITAMIN B12: Vitamin B-12: >1500 pg/mL — ABNORMAL HIGH (ref 211–911)

## 2022-06-23 MED ORDER — DONEPEZIL HCL 5 MG PO TABS: 5.0000 mg | ORAL_TABLET | Freq: Every day | ORAL | 1 refills | Status: DC

## 2022-06-23 NOTE — Progress Notes (Signed)
Assessment/Plan:   Karen [name] is a very pleasant 72 y.o. year old RH female with a history of hypertension, hyperlipidemia, arthritis with chronic pain, hypothyroidism, insomnia, depression seen today for evaluation of memory loss. MoCA today is 14/30.  She still able to participate in activities of daily living and continues to drive.  Workup is in progress.    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   How long did patient have memory difficulties?  Patient reports that she may have some memory difficulties since 2 years after retirement.  However, situational depression may have played a role according to her daughters.  "Sometimes she is in a state of fight or flight, she cannot rest, especially after the death of her son, brother and mother over the last 8 years ".  Daughters report that the patient may have had underlying ADHD, but he was never diagnosed. repeats oneself?  Endorsed, "constantly "-daughter says.  She constantly talks about wanting to become needed in the behavioral hospital Disoriented when walking into a room?  Patient denies  Leaving objects in unusual places? denies   Wandering behavior?  denies   Any personality changes since last visit?  Her daughter's report increased anxiety, and "she can never rest "she is also very tangential in her responses. Any worsening depression?:  As mentioned above, the patient  lost several members of her family initial.  Of time, which brought situational depression, and anxiety. Hallucinations or paranoia? Denies   Seizures?   Patient denies    Any sleep changes?  She goes to sleep at 1 am but not getting enough sleep; denies vivid dreams, REM behavior or sleepwalking   Sleep apnea?  "I don't know" .  Any hygiene concerns?  Patient denies   Independent of bathing and dressing?  Endorsed  Does the patient needs help with medications? She tries to take care of it, but she may miss some medications Who is in charge of the finances?   Daughters are in charge Any changes in appetite?   Decreased Patient have trouble swallowing?   denies   Does the patient cook? Yes    Any kitchen accidents such as leaving the stove on? Patient denies   Any headaches?   denies   Chronic back pain  denies   Ambulates with difficulty?    She fell and hit the L hip and since then her mobility is limited, but does not want to use a cane Recent falls or head injuries? No others Unilateral weakness, numbness or tingling?  denies   Any tremors?   denies   Any anosmia?  denies   Any incontinence of urine? Denies  Any bowel dysfunction?  Constipation .     Patient lives  with husband  History of heavy alcohol intake? denies   History of heavy tobacco use?  denies   Family history of dementia?   denies  Does patient drive? Endorsed, "she is great on the highways, but in the city not so good"  Retired Glass blower/designer, life coach   Past Medical History:  Diagnosis Date   Anemia YRS AGO   Anxiety    Arthritis    JOINT PAIN HIPS AND KNEE   Depression    NO MEDS SON DIED 10/16/15, TAKING THERAPY   Elevated cholesterol    Family history of breast cancer    Family history of lung cancer    Family history of ovarian cancer    Family history of pancreatic cancer    Family history of throat cancer    GERD (gastroesophageal reflux disease)    History of colonic polyps     Hypertension    Hypothyroidism    Thyroid disease    Tick bite 08/2015   UTI (urinary tract infection)    FINISHED ANTIBIOTICS 11-11-15     Past Surgical History:  Procedure Laterality Date   CESAREAN SECTION     X 4   COLONOSCOPY     COLONOSCOPY WITH PROPOFOL N/A 11/17/2015   Procedure: COLONOSCOPY WITH PROPOFOL;  Surgeon: Garlan Fair, MD;  Location: [facility];  Service: Endoscopy;  Laterality: N/A;   POLYPECTOMY       Allergies  Allergen Reactions   Zoloft [Sertraline Hcl] Other (See Comments)    "crazy feeling"    Current Outpatient Medications  Medication Instructions   acetaminophen (TYLENOL) 500 mg, Oral, Every 6 hours PRN   ascorbic acid (VITAMIN C) 500 mg, Oral, Daily   atenolol (TENORMIN) 50 mg, Oral, Daily, for blood pressure   cholecalciferol (VITAMIN D) 1,000 Units, Oral, Daily   donepezil (ARICEPT) 5 mg, Oral, Daily   ELDERBERRY PO 1 tablet, Oral, Daily   escitalopram (LEXAPRO) 20 mg, Oral, Daily   ezetimibe (ZETIA) 10 mg, Oral, Daily   fluticasone (FLONASE) 50 MCG/ACT nasal spray 1 spray, Each Nare, Daily PRN   methylcellulose (CITRUCEL) oral powder 1 packet, Oral, Daily   Multiple Vitamin (MULTIVITAMIN WITH MINERALS) TABS tablet 1 tablet, Oral, Daily   Synthroid 75 mcg, Oral, Daily   traMADol (ULTRAM) 50-100 mg, Oral, Every 6 hours PRN, Up to twice daily as needed for pain   triamterene-hydrochlorothiazide (MAXZIDE) 75-50 MG per tablet 1 tablet, Oral, Daily   TURMERIC PO 1 capsule, Oral, Daily     VITALS:   Vitals:   06/23/22 0750  BP: 134/79  Pulse: 95  Resp: 20  SpO2: 100%  Weight: 144 lb (65.3 kg)  Height: '5\' 1"'$  (1.549 m)      PHYSICAL EXAM   HEENT:  Normocephalic, atraumatic. The mucous membranes are moist. The superficial temporal arteries are without ropiness or tenderness. Cardiovascular: Regular rate and rhythm. Lungs: Clear to auscultation bilaterally. Neck: There are no carotid bruits noted bilaterally.  NEUROLOGICAL:     06/23/2022   12:00 PM  Montreal Cognitive Assessment   Visuospatial/ Executive (0/5) 1  Naming (0/3) 1  Attention: Read list of digits (0/2) 1  Attention: Read list of letters (0/1) 1  Attention: Serial 7 subtraction starting at 100 (0/3) 3  Language: Repeat phrase (0/2) 1  Language : Fluency (0/1) 0  Abstraction (0/2) 1  Delayed Recall (0/5) 0  Orientation (0/6) 5  Total 14  Adjusted Score (based on education) 14        No data to display           Orientation:  Alert and oriented to person, place and time.  Very anxious appearing, distracted at times.  No aphasia or dysarthria. Fund of knowledge is appropriate. Recent  and remote memory impaired.  Attention and concentration are reduced.  Able to name objects and repeat phrases. Delayed recall   0/5 Cranial nerves: There is good facial symmetry. Extraocular muscles are intact and visual fields are full to confrontational testing. Speech is fluent and clear. No tongue deviation. Hearing is intact to conversational tone. Tone: Tone is good throughout. Sensation: Sensation is intact to light touch and pinprick throughout. Vibration is intact at the bilateral big toe.There is no extinction with double simultaneous stimulation. There is no sensory dermatomal level identified. Coordination: The patient has no difficulty with RAM's or FNF bilaterally. Normal finger to nose  Motor: Strength is 5/5 in the bilateral upper and lower extremities. There is no pronator drift. There are no fasciculations noted. DTR's: Deep tendon reflexes are 2/4 at the bilateral biceps, triceps, brachioradialis, patella and achilles.  Plantar responses are downgoing bilaterally. Gait and Station: The patient is able to ambulate without difficulty.The patient is able to heel toe walk without any difficulty.The patient is able to ambulate in a tandem fashion. The patient is able to stand in the Romberg position.     Thank you for allowing [place] the opportunity to  participate in the care of this nice patient. Please do not hesitate to contact us for any questions or concerns.   Total time spent on today's visit was 60 minutes dedicated to this patient today, preparing to see patient, examining the patient, ordering tests and/or medications and counseling the patient, documenting clinical information in the EHR or other health record, independently interpreting results and communicating results to the patient/family, discussing treatment and goals, answering patient's questions and coordinating care.  Cc:  Holland Commons, FNP  Sharene Butters 06/23/2022 12:11 PM

## 2022-06-23 NOTE — Progress Notes (Signed)
Labs are good. Follow thee labs  with primary doctor

## 2022-06-23 NOTE — Patient Instructions (Addendum)
It was a pleasure to see you today at our office.   Recommendations:  Follow up in  2 months Check labs today Feel free to visit Facebook page " [email or web address]" for tips of how to care for people with memory problems.  If you have any severe symptoms of a stroke, or other severe issues such as confusion,severe chills or fever, etc call 911 or go to the ER as you may need to be evaluated further For assessment of decision of mental capacity and competency:  Call Dr. Anthoney Harada, geriatric psychiatrist at [phone] For psychiatric meds, mood meds: Please have your primary care physician manage these medications.  Referral to Psychiatry  Start Donepezil '5mg'$   daily. Side effects discussed     RECOMMENDATIONS FOR ALL PATIENTS WITH MEMORY PROBLEMS: 1. Continue to exercise (Recommend 30 minutes of walking everyday, or 3 hours every week) 2. Increase social interactions - continue going to [place] and enjoy social gatherings with friends and family 3. Eat healthy, avoid fried foods and eat more fruits and vegetables 4. Maintain adequate blood pressure, blood sugar, and blood cholesterol level. Reducing the risk of stroke and cardiovascular disease also helps promoting better memory. 5. Avoid stressful situations. Live a simple life and avoid aggravations. Organize your time and prepare for the next day in anticipation. 6. Sleep well, avoid any interruptions of sleep and avoid any distractions in the bedroom that may interfere with adequate sleep quality 7. Avoid sugar, avoid sweets as there is a strong link between excessive sugar intake, diabetes, and cognitive impairment We discussed the Mediterranean diet, which has been shown to help patients reduce the risk of progressive memory disorders and reduces cardiovascular risk. This includes eating fish, eat fruits and green leafy vegetables, nuts like almonds and hazelnuts, walnuts, and also use olive oil. Avoid fast foods and fried foods as much as  possible. Avoid sweets and sugar as sugar use has been linked to worsening of memory function.  There is always a concern of gradual progression of memory problems. If this is the case, then we may need to adjust level of care according to patient needs. Support, both to the patient and caregiver, should then be put into place.    FALL PRECAUTIONS: Be cautious when walking. Scan the area for obstacles that may increase the risk of trips and falls. When getting up in the mornings, sit up at the edge of the bed for a few minutes before getting out of bed. Consider elevating the bed at the head end to avoid drop of blood pressure when getting up. Walk always in a well-lit room (use night lights in the walls). Avoid area rugs or power cords from appliances in the middle of the walkways. Use a walker or a cane if necessary and consider physical therapy for balance exercise. Get your eyesight checked regularly.  FINANCIAL OVERSIGHT: Supervision, especially oversight when making financial decisions or transactions is also recommended.  HOME SAFETY: Consider the safety of the kitchen when operating appliances like stoves, microwave oven, and blender. Consider having supervision and share cooking responsibilities until no longer able to participate in those. Accidents with firearms and other hazards in the house should be identified and addressed as well.   ABILITY TO BE LEFT ALONE: If patient is unable to contact 911 operator, consider using LifeLine, or when the need is there, arrange for someone to stay with patients. Smoking is a fire hazard, consider supervision or cessation. Risk of wandering should be assessed by  caregiver and if detected at any point, supervision and safe proof recommendations should be instituted.  MEDICATION SUPERVISION: Inability to self-administer medication needs to be constantly addressed. Implement a mechanism to ensure safe administration of the medications.   DRIVING:  Regarding driving, in patients with progressive memory problems, driving will be impaired. We advise to have someone else do the driving if trouble finding directions or if minor accidents are reported. Independent driving assessment is available to determine safety of driving.   If you are interested in the driving assessment, you can contact the following:  [employer] in [place]  [place]  [facility] [place] [phone] or [phone] Labs today suite [place] at [phone] for MRI

## 2022-07-05 ENCOUNTER — Ambulatory Visit
Admission: RE | Admit: 2022-07-05 | Discharge: 2022-07-05 | Disposition: A | Discharge status: Home / Self Care | Payer: Medicare HMO | Source: Ambulatory Visit | Attending: Physician Assistant | Admitting: Physician Assistant

## 2022-07-05 DIAGNOSIS — R413 Other amnesia: Secondary | ICD-10-CM | POA: Diagnosis not present

## 2022-07-05 DIAGNOSIS — G319 Degenerative disease of nervous system, unspecified: Secondary | ICD-10-CM | POA: Diagnosis not present

## 2022-07-05 DIAGNOSIS — I6782 Cerebral ischemia: Secondary | ICD-10-CM | POA: Diagnosis not present

## 2022-07-05 NOTE — Progress Notes (Signed)
Please, inform patient that the MRI results show chronic aging changes in the vessels and some atrophy. No stroke, masses, fluid or infection or other acute findings is seen.

## 2022-07-15 ENCOUNTER — Other Ambulatory Visit: Payer: Self-pay | Admitting: Physician Assistant

## 2022-08-01 DIAGNOSIS — F332 Major depressive disorder, recurrent severe without psychotic features: Secondary | ICD-10-CM | POA: Diagnosis not present

## 2022-08-09 DIAGNOSIS — G301 Alzheimer's disease with late onset: Secondary | ICD-10-CM | POA: Diagnosis not present

## 2022-08-09 DIAGNOSIS — F332 Major depressive disorder, recurrent severe without psychotic features: Secondary | ICD-10-CM | POA: Diagnosis not present

## 2022-08-09 DIAGNOSIS — F418 Other specified anxiety disorders: Secondary | ICD-10-CM | POA: Diagnosis not present

## 2022-08-12 DIAGNOSIS — F418 Other specified anxiety disorders: Secondary | ICD-10-CM | POA: Diagnosis not present

## 2022-08-12 DIAGNOSIS — G301 Alzheimer's disease with late onset: Secondary | ICD-10-CM | POA: Diagnosis not present

## 2022-08-12 DIAGNOSIS — F332 Major depressive disorder, recurrent severe without psychotic features: Secondary | ICD-10-CM | POA: Diagnosis not present

## 2022-08-23 ENCOUNTER — Ambulatory Visit: Payer: Medicare HMO | Admitting: Physician Assistant

## 2022-08-23 ENCOUNTER — Encounter: Payer: Self-pay | Admitting: Physician Assistant

## 2022-08-23 VITALS — BP 170/94 | HR 83 | Resp 20 | Ht 61.0 in | Wt 141.0 lb

## 2022-08-23 DIAGNOSIS — R413 Other amnesia: Secondary | ICD-10-CM

## 2022-08-23 MED ORDER — DONEPEZIL HCL 10 MG PO TABS: 10.0000 mg | ORAL_TABLET | Freq: Every day | ORAL | 11 refills | Status: DC

## 2022-08-23 MED ORDER — DONEPEZIL HCL 10 MG PO TABS: 5.0000 mg | ORAL_TABLET | Freq: Every day | ORAL | 11 refills | Status: DC

## 2022-08-23 NOTE — Progress Notes (Signed)
Assessment/Plan:   Mild Cognitive Impairment    Karen [name] is a very pleasant 72 y.o. RH female  with a history of hypertension, hyperlipidemia, arthritis with chronic pain, hypothyroidism, insomnia, depression seen today in follow up to discuss the MRI of the brain results. These were personally reviewed, remarkable for mild chronic small vessel ischemic changes within the cerebral white matter, mild to moderate generalized cerebral atrophy and mild cerebellar atrophy, no acute findings.  Patient is currently on donepezil 5 mg daily.  Her psychiatrist recommends that donepezil be increased to 10 mg daily, and in view of the MRI of the brain results, it is felt appropriate.  However, there might be a component of attention and psychiatric elements to her memory issues. Last MoCA on  06/23/22 was 14/30.  She still able to perform some of her ADLs.  She continues to drive.  This patient is accompanied in the office by her husband who supplement  the history.  Previous records as well as any outside records available were reviewed prior to todays visit.     Follow up in  6 months. Recommend good control of cardiovascular risk factors Continue to control mood as per PCP. Continue Lexapro 20 mg daily.  A referral to psychiatry has been recently placed for the management of anxiety and depression Referral to  Neuropsych testing for clarity of diagnosis and to look for other causes of memory loss including sleep, attention, depression, anxiety, etc.   Initial visit 06/23/22  How long did patient have memory difficulties?  Patient reports that she may have some memory difficulties since 2 years after retirement.  However, situational depression may have played a role according to her daughters.  "Sometimes she is in a state of fight or flight, she cannot rest, especially after the death of her son, brother and mother over the last 8 years ".  Daughters report that the patient may have had  underlying ADHD, but he was never diagnosed. repeats oneself?  Endorsed, "constantly "-daughter says.  She constantly talks about wanting to become needed in the behavioral hospital Disoriented when walking into a room?  Patient denies  Leaving objects in unusual places? denies   Wandering behavior?  denies   Any personality changes since last visit?  Her daughter's report increased anxiety, and "she can never rest "she is also very tangential in her responses. Any worsening depression?:  As mentioned above, the patient lost several members of her family initial.  Of time, which brought situational depression, and anxiety. Hallucinations or paranoia? Denies   Seizures?   Patient denies    Any sleep changes?  She goes to sleep at 1 am but not getting enough sleep; denies vivid dreams, REM behavior or sleepwalking   Sleep apnea?  "I don't know" .  Any hygiene concerns?  Patient denies   Independent of bathing and dressing?  Endorsed  Does the patient needs help with medications? She tries to take care of it, but she may miss some medications Who is in charge of the finances?   Daughters are in charge Any changes in appetite?   Decreased Patient have trouble swallowing?   denies   Does the patient cook? Yes    Any kitchen accidents such as leaving the stove on? Patient denies   Any headaches?   denies   Chronic back pain  denies   Ambulates with difficulty?    She fell and hit the L hip and since then her mobility is  limited, but does not want to use a cane Recent falls or head injuries? No others Unilateral weakness, numbness or tingling?  denies   Any tremors?   denies   Any anosmia?  denies   Any incontinence of urine? Denies  Any bowel dysfunction?  Constipation .     Patient lives  with husband  History of heavy alcohol intake? denies   History of heavy tobacco use?  denies   Family history of dementia?   denies  Does patient drive? Endorsed, "she is great on the highways, but in  the city not so good" Retired Psychologist, counselling, life coach    Labs 06/2022 TSH 2.87  B12 >1500    CURRENT MEDICATIONS:  Outpatient Encounter Medications as of 08/23/2022  Medication Sig   acetaminophen (TYLENOL) 500 MG tablet Take 500 mg by mouth every 6 (six) hours as needed (pain).   atenolol (TENORMIN) 50 MG tablet Take 50 mg by mouth daily. for blood pressure   cholecalciferol (VITAMIN D) 1000 UNITS tablet Take 1,000 Units by mouth daily.   donepezil (ARICEPT) 5 MG tablet TAKE 1 TABLET (5 MG TOTAL) BY MOUTH DAILY.   ELDERBERRY PO Take 1 tablet by mouth daily.   escitalopram (LEXAPRO) 20 MG tablet Take 20 mg by mouth daily.   ezetimibe (ZETIA) 10 MG tablet Take 10 mg by mouth daily.   fluticasone (FLONASE) 50 MCG/ACT nasal spray Place 1 spray into both nostrils daily as needed for allergies or rhinitis.   methylcellulose (CITRUCEL) oral powder Take 1 packet by mouth daily.   Multiple Vitamin (MULTIVITAMIN WITH MINERALS) TABS tablet Take 1 tablet by mouth daily.   SYNTHROID 75 MCG tablet Take 75 mcg by mouth daily.   traMADol (ULTRAM) 50 MG tablet Take 1-2 tablets (50-100 mg total) by mouth every 6 (six) hours as needed. Up to twice daily as needed for pain (Patient taking differently: Take 50 mg by mouth 2 (two) times daily.)   triamterene-hydrochlorothiazide (MAXZIDE) 75-50 MG per tablet Take 1 tablet by mouth daily.   TURMERIC PO Take 1 capsule by mouth daily.   vitamin C (ASCORBIC ACID) 500 MG tablet Take 500 mg by mouth daily.   No facility-administered encounter medications on file as of 08/23/2022.        No data to display            06/23/2022   12:00 PM  Montreal Cognitive Assessment   Visuospatial/ Executive (0/5) 1  Naming (0/3) 1  Attention: Read list of digits (0/2) 1  Attention: Read list of letters (0/1) 1  Attention: Serial 7 subtraction starting at 100 (0/3) 3  Language: Repeat phrase (0/2) 1  Language : Fluency (0/1) 0  Abstraction (0/2) 1  Delayed Recall  (0/5) 0  Orientation (0/6) 5  Total 14  Adjusted Score (based on education) 14   Thank you for allowing [place] the opportunity to participate in the care of this nice patient. Please do not hesitate to contact us for any questions or concerns.   Total time spent on today's visit was 23 minutes dedicated to this patient today, preparing to see patient, examining the patient, ordering tests and/or medications and counseling the patient, documenting clinical information in the EHR or other health record, independently interpreting results and communicating results to the patient/family, discussing treatment and goals, answering patient's questions and coordinating care.  Cc:  Fatima Sanger, FNP  Marlowe Kays 08/23/2022 9:37 AM

## 2022-08-23 NOTE — Patient Instructions (Signed)
It was a pleasure to see you today at our office.   Recommendations:  Follow up in  6 months   Increase donepezil to 10 mg daily.  Continue your visit to the psychiatrist  Neuropsych testing for clarity of diagnosis   If you have any severe symptoms of a stroke, or other severe issues such as confusion,severe chills or fever, etc call 911 or go to the ER as you may need to be evaluated further For assessment of decision of mental capacity and competency:  Call Dr. Erick Blinks, geriatric psychiatrist at [phone] For psychiatric meds, mood meds: Please have your primary care physician manage these medications.  Referral to Psychiatry  Start Donepezil 5mg   daily. Side effects discussed     RECOMMENDATIONS FOR ALL PATIENTS WITH MEMORY PROBLEMS: 1. Continue to exercise (Recommend 30 minutes of walking everyday, or 3 hours every week) 2. Increase social interactions - continue going to [place] and enjoy social gatherings with friends and family 3. Eat healthy, avoid fried foods and eat more fruits and vegetables 4. Maintain adequate blood pressure, blood sugar, and blood cholesterol level. Reducing the risk of stroke and cardiovascular disease also helps promoting better memory. 5. Avoid stressful situations. Live a simple life and avoid aggravations. Organize your time and prepare for the next day in anticipation. 6. Sleep well, avoid any interruptions of sleep and avoid any distractions in the bedroom that may interfere with adequate sleep quality 7. Avoid sugar, avoid sweets as there is a strong link between excessive sugar intake, diabetes, and cognitive impairment We discussed the Mediterranean diet, which has been shown to help patients reduce the risk of progressive memory disorders and reduces cardiovascular risk. This includes eating fish, eat fruits and green leafy vegetables, nuts like almonds and hazelnuts, walnuts, and also use olive oil. Avoid fast foods and fried foods as much  as possible. Avoid sweets and sugar as sugar use has been linked to worsening of memory function.  There is always a concern of gradual progression of memory problems. If this is the case, then we may need to adjust level of care according to patient needs. Support, both to the patient and caregiver, should then be put into place.    FALL PRECAUTIONS: Be cautious when walking. Scan the area for obstacles that may increase the risk of trips and falls. When getting up in the mornings, sit up at the edge of the bed for a few minutes before getting out of bed. Consider elevating the bed at the head end to avoid drop of blood pressure when getting up. Walk always in a well-lit room (use night lights in the walls). Avoid area rugs or power cords from appliances in the middle of the walkways. Use a walker or a cane if necessary and consider physical therapy for balance exercise. Get your eyesight checked regularly.  FINANCIAL OVERSIGHT: Supervision, especially oversight when making financial decisions or transactions is also recommended.  HOME SAFETY: Consider the safety of the kitchen when operating appliances like stoves, microwave oven, and blender. Consider having supervision and share cooking responsibilities until no longer able to participate in those. Accidents with firearms and other hazards in the house should be identified and addressed as well.   ABILITY TO BE LEFT ALONE: If patient is unable to contact 911 operator, consider using LifeLine, or when the need is there, arrange for someone to stay with patients. Smoking is a fire hazard, consider supervision or cessation. Risk of wandering should be assessed  by caregiver and if detected at any point, supervision and safe proof recommendations should be instituted.  MEDICATION SUPERVISION: Inability to self-administer medication needs to be constantly addressed. Implement a mechanism to ensure safe administration of the medications.   DRIVING:  Regarding driving, in patients with progressive memory problems, driving will be impaired. We advise to have someone else do the driving if trouble finding directions or if minor accidents are reported. Independent driving assessment is available to determine safety of driving.   If you are interested in the driving assessment, you can contact the following:  [employer] in [place] [phone]  [facility] [phone]  [facility] [phone]  [facility] [phone] or [phone] Labs today [address] Imaging at [phone] for MRI

## 2022-08-31 ENCOUNTER — Ambulatory Visit (HOSPITAL_COMMUNITY): Payer: Medicare HMO | Admitting: Psychiatry

## 2022-10-06 DIAGNOSIS — M47816 Spondylosis without myelopathy or radiculopathy, lumbar region: Secondary | ICD-10-CM | POA: Diagnosis not present

## 2022-10-06 DIAGNOSIS — G894 Chronic pain syndrome: Secondary | ICD-10-CM | POA: Diagnosis not present

## 2022-10-06 DIAGNOSIS — M545 Low back pain, unspecified: Secondary | ICD-10-CM | POA: Diagnosis not present

## 2022-10-06 DIAGNOSIS — M1711 Unilateral primary osteoarthritis, right knee: Secondary | ICD-10-CM | POA: Diagnosis not present

## 2022-10-06 DIAGNOSIS — M25561 Pain in right knee: Secondary | ICD-10-CM | POA: Diagnosis not present

## 2022-10-06 DIAGNOSIS — Z79891 Long term (current) use of opiate analgesic: Secondary | ICD-10-CM | POA: Diagnosis not present

## 2022-10-18 ENCOUNTER — Ambulatory Visit (HOSPITAL_COMMUNITY): Payer: Medicare HMO | Admitting: Psychiatry

## 2022-10-26 DIAGNOSIS — R7301 Impaired fasting glucose: Secondary | ICD-10-CM | POA: Diagnosis not present

## 2022-10-26 DIAGNOSIS — E7800 Pure hypercholesterolemia, unspecified: Secondary | ICD-10-CM | POA: Diagnosis not present

## 2022-11-02 DIAGNOSIS — M1711 Unilateral primary osteoarthritis, right knee: Secondary | ICD-10-CM | POA: Diagnosis not present

## 2022-11-02 DIAGNOSIS — M25561 Pain in right knee: Secondary | ICD-10-CM | POA: Diagnosis not present

## 2022-11-02 DIAGNOSIS — E039 Hypothyroidism, unspecified: Secondary | ICD-10-CM | POA: Diagnosis not present

## 2022-11-02 DIAGNOSIS — R413 Other amnesia: Secondary | ICD-10-CM | POA: Diagnosis not present

## 2022-11-02 DIAGNOSIS — E7800 Pure hypercholesterolemia, unspecified: Secondary | ICD-10-CM | POA: Diagnosis not present

## 2022-11-02 DIAGNOSIS — M47816 Spondylosis without myelopathy or radiculopathy, lumbar region: Secondary | ICD-10-CM | POA: Diagnosis not present

## 2022-11-02 DIAGNOSIS — G894 Chronic pain syndrome: Secondary | ICD-10-CM | POA: Diagnosis not present

## 2022-11-02 DIAGNOSIS — I10 Essential (primary) hypertension: Secondary | ICD-10-CM | POA: Diagnosis not present

## 2022-11-02 DIAGNOSIS — R634 Abnormal weight loss: Secondary | ICD-10-CM | POA: Diagnosis not present

## 2022-11-02 DIAGNOSIS — Z79891 Long term (current) use of opiate analgesic: Secondary | ICD-10-CM | POA: Diagnosis not present

## 2022-11-02 DIAGNOSIS — M545 Low back pain, unspecified: Secondary | ICD-10-CM | POA: Diagnosis not present

## 2022-11-02 DIAGNOSIS — F418 Other specified anxiety disorders: Secondary | ICD-10-CM | POA: Diagnosis not present

## 2022-11-02 DIAGNOSIS — I700 Atherosclerosis of aorta: Secondary | ICD-10-CM | POA: Diagnosis not present

## 2022-11-02 DIAGNOSIS — K089 Disorder of teeth and supporting structures, unspecified: Secondary | ICD-10-CM | POA: Diagnosis not present

## 2022-11-02 DIAGNOSIS — E559 Vitamin D deficiency, unspecified: Secondary | ICD-10-CM | POA: Diagnosis not present

## 2022-11-11 ENCOUNTER — Other Ambulatory Visit: Payer: Self-pay

## 2022-11-11 ENCOUNTER — Emergency Department (HOSPITAL_COMMUNITY)
Admission: EM | Admit: 2022-11-11 | Discharge: 2022-11-11 | Disposition: A | Discharge status: Home / Self Care | Payer: Medicare HMO | Attending: Emergency Medicine | Admitting: Emergency Medicine

## 2022-11-11 ENCOUNTER — Encounter (HOSPITAL_COMMUNITY): Payer: Self-pay | Admitting: *Deleted

## 2022-11-11 ENCOUNTER — Emergency Department (HOSPITAL_COMMUNITY): Payer: Medicare HMO

## 2022-11-11 DIAGNOSIS — K8689 Other specified diseases of pancreas: Secondary | ICD-10-CM | POA: Diagnosis not present

## 2022-11-11 DIAGNOSIS — R1013 Epigastric pain: Secondary | ICD-10-CM | POA: Diagnosis not present

## 2022-11-11 DIAGNOSIS — R109 Unspecified abdominal pain: Secondary | ICD-10-CM | POA: Diagnosis not present

## 2022-11-11 DIAGNOSIS — R110 Nausea: Secondary | ICD-10-CM | POA: Diagnosis not present

## 2022-11-11 DIAGNOSIS — R634 Abnormal weight loss: Secondary | ICD-10-CM | POA: Insufficient documentation

## 2022-11-11 DIAGNOSIS — D259 Leiomyoma of uterus, unspecified: Secondary | ICD-10-CM | POA: Diagnosis not present

## 2022-11-11 LAB — CBC
HCT: 42.7 % (ref 36.0–46.0)
Hemoglobin: 13 g/dL (ref 12.0–15.0)
MCH: 28.2 pg (ref 26.0–34.0)
MCHC: 30.4 g/dL (ref 30.0–36.0)
MCV: 92.6 fL (ref 80.0–100.0)
Platelets: 288 10*3/uL (ref 150–400)
RBC: 4.61 MIL/uL (ref 3.87–5.11)
RDW: 11.9 % (ref 11.5–15.5)
WBC: 5.7 10*3/uL (ref 4.0–10.5)
nRBC: 0 % (ref 0.0–0.2)

## 2022-11-11 LAB — URINALYSIS, ROUTINE W REFLEX MICROSCOPIC
Bacteria, UA: NONE SEEN
Bilirubin Urine: NEGATIVE
Glucose, UA: NEGATIVE mg/dL
Hgb urine dipstick: NEGATIVE
Ketones, ur: NEGATIVE mg/dL
Nitrite: NEGATIVE
Protein, ur: NEGATIVE mg/dL
Specific Gravity, Urine: 1.017 (ref 1.005–1.030)
pH: 7 (ref 5.0–8.0)

## 2022-11-11 LAB — COMPREHENSIVE METABOLIC PANEL
ALT: 29 U/L (ref 0–44)
AST: 34 U/L (ref 15–41)
Albumin: 4 g/dL (ref 3.5–5.0)
Alkaline Phosphatase: 43 U/L (ref 38–126)
Anion gap: 9 (ref 5–15)
BUN: 10 mg/dL (ref 8–23)
CO2: 30 mmol/L (ref 22–32)
Calcium: 10.4 mg/dL — ABNORMAL HIGH (ref 8.9–10.3)
Chloride: 101 mmol/L (ref 98–111)
Creatinine, Ser: 0.58 mg/dL (ref 0.44–1.00)
GFR, Estimated: >60 mL/min (ref >60)
Glucose, Bld: 119 mg/dL — ABNORMAL HIGH (ref 70–99)
Potassium: 3.8 mmol/L (ref 3.5–5.1)
Sodium: 140 mmol/L (ref 135–145)
Total Bilirubin: 0.5 mg/dL (ref 0.3–1.2)
Total Protein: 8.3 g/dL — ABNORMAL HIGH (ref 6.5–8.1)

## 2022-11-11 LAB — LIPASE, BLOOD: Lipase: 74 U/L — ABNORMAL HIGH (ref 11–51)

## 2022-11-11 MED ORDER — SODIUM CHLORIDE 0.9 % IV BOLUS
1000.0000 mL | Freq: Once | INTRAVENOUS | Status: AC
  Administered 2022-11-11: 1000 mL via INTRAVENOUS

## 2022-11-11 MED ORDER — IOHEXOL 300 MG/ML  SOLN
100.0000 mL | Freq: Once | INTRAMUSCULAR | Status: AC | PRN
  Administered 2022-11-11: 100 mL via INTRAVENOUS

## 2022-11-11 MED ORDER — PANTOPRAZOLE SODIUM 20 MG PO TBEC: 20.0000 mg | DELAYED_RELEASE_TABLET | Freq: Every day | ORAL | 0 refills | Status: DC

## 2022-11-11 MED ORDER — OXYCODONE-ACETAMINOPHEN 5-325 MG PO TABS: 1.0000 | ORAL_TABLET | Freq: Four times a day (QID) | ORAL | 0 refills | Status: DC | PRN

## 2022-11-11 NOTE — ED Triage Notes (Signed)
Pt presents with multiple complaints for over a month, wt loss of 20 lbs last month, when eats abd swelling, memory loss, pt poor historian in obtaining reason for visit. States she is SI because she feels bad, no plan.

## 2022-11-11 NOTE — Discharge Instructions (Signed)
It was a pleasure taking care of you here in the emergency department.  Continue taking tramadol at home as previously prescribed  May also rotate Tylenol and ibuprofen  Have started on Protonix as well which is a medication that can help coat the stomach while taking the ibuprofen  Follow up with primary care provider  Return for new or worsening symptoms

## 2022-11-11 NOTE — ED Provider Notes (Signed)
[place] EMERGENCY DEPARTMENT AT [facility] Provider Note   CSN: [id number] Arrival date & time: 11/11/22  1243     History  Chief Complaint  Patient presents with   Abdominal [name]   Weight Loss   Patient has memory impairment and husband is at bedside to help provide some history. Karen [name] is a 72 y.o. female who presents with concern for about 2 or 3 days of abdominal [name].  The [name] is generalized and she also notes more abdominal swelling.  She reports a weight loss about 20 pounds over the last 3 months.  She also has not been eating well as her teeth has been removed and she is now on a soft diet.  She denies any diarrhea, constipation, fever, chills, cough, shortness of breath, chest [name], vomiting.  She endorses some nausea. Denies any previous abdominal surgeries.   Abdominal [name]      Home Medications Prior to Admission medications   Medication Sig Start Date End Date Taking? Authorizing Provider  acetaminophen (TYLENOL) 500 MG tablet Take 500 mg by mouth every 6 (six) hours as needed ([name]).    [provider]  atenolol (TENORMIN) 50 MG tablet Take 50 mg by mouth daily. for blood pressure 11/03/14   [provider]  cholecalciferol (VITAMIN D) 1000 UNITS tablet Take 1,000 Units by mouth daily.    [provider]  donepezil (ARICEPT) 10 MG tablet Take 1 tablet (10 mg total) by mouth daily. 08/23/22   Marcos Eke, PA-C  ELDERBERRY PO Take 1 tablet by mouth daily.    [provider]  escitalopram (LEXAPRO) 20 MG tablet Take 20 mg by mouth daily.    [provider]  ezetimibe (ZETIA) 10 MG tablet Take 10 mg by mouth daily. 06/10/19   [provider]  fluticasone (FLONASE) 50 MCG/ACT nasal spray Place 1 spray into both nostrils daily as needed for allergies or rhinitis.    [provider]  methylcellulose (CITRUCEL) oral powder Take 1 packet by mouth daily. 02/12/21   Rachael Fee,  MD  Multiple Vitamin (MULTIVITAMIN WITH MINERALS) TABS tablet Take 1 tablet by mouth daily.    [provider]  SYNTHROID 75 MCG tablet Take 75 mcg by mouth daily. 01/22/20   [provider]  traMADol (ULTRAM) 50 MG tablet Take 1-2 tablets (50-100 mg total) by mouth every 6 (six) hours as needed. Up to twice daily as needed for [name] Patient taking differently: Take 50 mg by mouth 2 (two) times daily. 08/10/16   Tobey Grim, MD  triamterene-hydrochlorothiazide (MAXZIDE) 75-50 MG per tablet Take 1 tablet by mouth daily. 10/27/14   [provider]  TURMERIC PO Take 1 capsule by mouth daily.    [provider]  vitamin C (ASCORBIC ACID) 500 MG tablet Take 500 mg by mouth daily.    [provider]      Allergies    Zoloft [sertraline hcl]    Review of Systems   Review of Systems  Gastrointestinal:  Positive for abdominal [name].    Physical Exam Updated Vital Signs BP (!) 163/111   Pulse 92   Temp 98.3 F (36.8 C) (Oral)   Resp 19   Ht 5\' 2"  (1.575 m)   Wt 62.6 kg   SpO2 99%   BMI 25.24 kg/m  Physical Exam Vitals and nursing note reviewed.  Constitutional:      General: She is not in acute distress.  Appearance: She is well-developed.  HENT:     Head: Normocephalic and atraumatic.  Eyes:     Conjunctiva/sclera: Conjunctivae normal.  Cardiovascular:     Rate and Rhythm: Normal rate and regular rhythm.     Heart sounds: No murmur heard. Pulmonary:     Effort: Pulmonary effort is normal. No respiratory distress.     Breath sounds: Normal breath sounds.  Abdominal:     Palpations: Karen is soft.     Tenderness: There is abdominal tenderness in the epigastric area.     Comments: No tenderness palpation of the right lower quadrant or left lower quadrant  Musculoskeletal:        General: No swelling.     Cervical back: Neck supple.  Skin:    General: Skin is warm and dry.     Capillary Refill: Capillary refill takes less than  2 seconds.     Comments: No erythema, edema, or rash, wounds in the Karen.  Neurological:     General: No focal deficit present.     Mental Status: She is alert.  Psychiatric:        Mood and Affect: Mood normal.     ED Results / Procedures / Treatments   Labs (all labs ordered are listed, but only abnormal results are displayed) Labs Reviewed  LIPASE, BLOOD - Abnormal; Notable for the following components:      Result Value   Lipase 74 (*)    All other components within normal limits  COMPREHENSIVE METABOLIC PANEL - Abnormal; Notable for the following components:   Glucose, Bld 119 (*)    Calcium 10.4 (*)    Total Protein 8.3 (*)    All other components within normal limits  URINALYSIS, ROUTINE W REFLEX MICROSCOPIC - Abnormal; Notable for the following components:   APPearance HAZY (*)    Leukocytes,Ua TRACE (*)    All other components within normal limits  CBC    EKG None  Radiology No results found.  Procedures Procedures    Medications Ordered in ED Medications  sodium chloride 0.9 % bolus 1,000 mL (1,000 mLs Intravenous New Bag/Given 11/11/22 1456)  iohexol (OMNIPAQUE) 300 MG/ML solution 100 mL (100 mLs Intravenous Contrast Given 11/11/22 1539)    ED Course/ Medical Decision Making/ A&P                             Medical Decision Making Amount and/or Complexity of Data Reviewed Labs: ordered. Radiology: ordered.  Risk Prescription drug management.   72 y.o. female presents to the ED for concern of abdominal [name] and swelling for the past 2 days  Differential diagnosis includes but is not limited to appendicitis, cholangitis, cholecystitis, gastroenteritis  ED Course:  Patient overall well-appearing and vital signs stable except for an elevated blood pressure 160/101.  Her husband is at bedside with some history as she has memory impairment.  Reports abdominal [name] for the past 2 days and also reports weight loss of about 20 pounds over the last 3  months.  Patient does have mild tenderness palpation of the epigastric region and endorses nausea.  CBC, CMP, urinalysis unremarkable.  Her lipase is slightly elevated at 74, and given endorsement of weight loss, will obtain CT Karen pelvis for further evaluation.   Impression: Abdominal [name]  Disposition:  Patient handed off to Britni Henderly PA-C.  Will follow-up on CT scan and dispo patient as needed.  Lab Tests: I Ordered,  and personally interpreted labs.  The pertinent results include:   CBC and CMP within normal limits Urinalysis unremarkable Lipase elevated at 74  Imaging Studies ordered: I ordered imaging studies including CT Karen pelvis   Co morbidities that complicate the patient evaluation  Memory impairment              Final Clinical Impression(s) / ED Diagnoses Final diagnoses:  Epigastric [name]    Rx / DC Orders ED Discharge Orders     None         Arabella Merles, PA-C 11/11/22 1553    Arby Barrette, MD 11/28/22 [phone]

## 2022-11-11 NOTE — ED Provider Notes (Signed)
Care assumed from previous provider.  See note for full HPI.  In summation 72 year old with known memory loss here for evaluation of abdominal pain over the last 2 to 3 days.  Feels like her abdomen bloats when she eats.  States she has had some weight loss over the last month however per family patient has not been eating because she cannot put her dentures and because it hurts her teeth.  No bloody stool.  No fever. Physical Exam  BP (!) 188/92   Pulse 84   Temp 98.3 F (36.8 C) (Oral)   Resp 19   Ht 5\' 2"  (1.575 m)   Wt 62.6 kg   SpO2 100%   BMI 25.24 kg/m   Physical Exam Vitals and nursing note reviewed.  Constitutional:      General: She is not in acute distress.    Appearance: She is well-developed. She is not ill-appearing.  HENT:     Head: Atraumatic.  Eyes:     Pupils: Pupils are equal, round, and reactive to light.  Cardiovascular:     Rate and Rhythm: Normal rate.  Pulmonary:     Effort: No respiratory distress.  Abdominal:     General: Bowel sounds are normal. There is no distension.     Palpations: Abdomen is soft.  Musculoskeletal:        General: Normal range of motion.     Cervical back: Normal range of motion.  Skin:    General: Skin is warm and dry.  Neurological:     General: No focal deficit present.     Mental Status: She is alert.  Psychiatric:        Mood and Affect: Mood normal.     Procedures  Procedures Labs Reviewed  LIPASE, BLOOD - Abnormal; Notable for the following components:      Result Value   Lipase 74 (*)    All other components within normal limits  COMPREHENSIVE METABOLIC PANEL - Abnormal; Notable for the following components:   Glucose, Bld 119 (*)    Calcium 10.4 (*)    Total Protein 8.3 (*)    All other components within normal limits  URINALYSIS, ROUTINE W REFLEX MICROSCOPIC - Abnormal; Notable for the following components:   APPearance HAZY (*)    Leukocytes,Ua TRACE (*)    All other components within normal limits   CBC   CT ABDOMEN PELVIS W CONTRAST  Result Date: 11/11/2022 CLINICAL DATA:  Weight loss, abdominal distension and abdominal pain. Elevated lipase. EXAM: CT ABDOMEN AND PELVIS WITH CONTRAST TECHNIQUE: Multidetector CT imaging of the abdomen and pelvis was performed using the standard protocol following bolus administration of intravenous contrast. RADIATION DOSE REDUCTION: This exam was performed according to the departmental dose-optimization program which includes automated exposure control, adjustment of the mA and/or kV according to patient size and/or use of iterative reconstruction technique. CONTRAST:  OMNIPAQUE IOHEXOL 300 MG/ML  SOLN COMPARISON:  MRI of the abdomen on 02/11/2021 FINDINGS: Lower chest: No acute abnormality. Hepatobiliary: No focal liver abnormality is seen. No gallstones, gallbladder wall thickening, or biliary dilatation. Pancreas: Minimal pancreatic ductal prominence within the body of the pancreas measuring up to 3 mm. No evidence of significant peripancreatic inflammation, focal pancreatic lesion, pseudocyst or necrosis. Spleen: Normal in size without focal abnormality. Adrenals/Urinary Tract: Adrenal glands are unremarkable. Kidneys are normal, without renal calculi, focal lesion, or hydronephrosis. Bladder is unremarkable. Stomach/Bowel: Bowel shows no evidence of obstruction, ileus, inflammation or lesion. The appendix is normal.  No free intraperitoneal air. Vascular/Lymphatic: No significant vascular findings are present. No enlarged abdominal or pelvic lymph nodes. Reproductive: Coarse calcifications within the uterus are consistent with degenerated fibroids. No adnexal masses. Other: No abdominal wall hernia or abnormality. No focal abscess or ascites. Musculoskeletal: No acute or significant osseous findings. IMPRESSION: 1. Minimal pancreatic ductal prominence within the body of the pancreas measuring up to 3 mm. No evidence of significant peripancreatic inflammation,  focal pancreatic lesion, pseudocyst or necrosis. 2. Degenerated uterine fibroids. Electronically Signed   By: Irish Lack M.D.   On: 11/11/2022 16:03    ED Course / MDM    Medical Decision Making Amount and/or Complexity of Data Reviewed Independent Historian: friend External Data Reviewed: labs, radiology and notes. Labs: ordered. Decision-making details documented in ED Course. Radiology: ordered and independent interpretation performed. Decision-making details documented in ED Course.  Risk OTC drugs. Prescription drug management. Parenteral controlled substances. Decision regarding hospitalization. Diagnosis or treatment significantly limited by social determinants of health.   In summation 72 year old with known memory loss here for evaluation of abdominal pain over the last 2 to 3 days.  Feels like her abdomen bloats when she eats.  States she has had some abnormal weight loss over the last month however per family patient has not been eating because she cannot put her dentures and because it hurts her teeth.  No bloody stool.  No fever.  We discussed labs and imaging with family and patient in room.  Unclear etiology of her abdominal pain.  Does have a mildly elevated lipase however CT scan reassuring without evidence of pancreatitis, dilated biliary duct to suggest choledocholithiasis.  Normal LFTs..  She denies any bloody stool to suggest upper GI bleed, ulcer.  Appears clinically well-hydrated on exam.  Does sound like per family weight loss likely due to decreased p.o. intake due to having her teeth removed.  No vomiting however had some nausea.  No changes in bowel movements such as diarrhea, constipation.  She has a nonfocal neuroexam without deficits.  Discussed symptomatic management, liquid diet however patient already on liquid diet per family until symptoms resolved.  Has follow-up appointment with PCP.  Will start on Protonix.  Follow-up outpatient, return for new or  worsening symptoms.  The patient has been appropriately medically screened and/or stabilized in the ED. I have low suspicion for any other emergent medical condition which would require further screening, evaluation or treatment in the ED or require inpatient management.  Patient is hemodynamically stable and in no acute distress.  Patient able to ambulate in department prior to ED.  Evaluation does not show acute pathology that would require ongoing or additional emergent interventions while in the emergency department or further inpatient treatment.  I have discussed the diagnosis with the patient and answered all questions.  Pain is been managed while in the emergency department and patient has no further complaints prior to discharge.  Patient is comfortable with plan discussed in room and is stable for discharge at this time.  I have discussed strict return precautions for returning to the emergency department.  Patient was encouraged to follow-up with PCP/specialist refer to at discharge.              Linwood Dibbles, PA-C 11/11/22 1707    Terald Sleeper, MD 11/11/22 [phone]

## 2022-11-16 ENCOUNTER — Telehealth: Payer: Self-pay

## 2022-11-16 NOTE — Telephone Encounter (Signed)
Transition Care Management Unsuccessful Follow-up Telephone Call  Date of discharge and from where:  11/11/2022 [facility]  Attempts:  1st Attempt  Reason for unsuccessful TCM follow-up call:  No answer/busy  Nil Bolser [facility]  [facility] Population Health Community Resource Care Guide   ??[email or web address]  ?? [phone]   Website: [email or web address]  [email or web address]

## 2022-11-17 ENCOUNTER — Telehealth: Payer: Self-pay

## 2022-11-17 NOTE — Telephone Encounter (Signed)
Transition Care Management Unsuccessful Follow-up Telephone Call  Date of discharge and from where:  11/11/2022 [facility]  Attempts:  2nd Attempt  Reason for unsuccessful TCM follow-up call:  Left voice message  Kameron Blethen [facility]  [facility] Population Health Community Resource Care Guide   [email or web address]  ?? [phone]   Website: [email or web address]  [email or web address]

## 2022-11-18 DIAGNOSIS — R634 Abnormal weight loss: Secondary | ICD-10-CM | POA: Diagnosis not present

## 2022-11-18 DIAGNOSIS — K089 Disorder of teeth and supporting structures, unspecified: Secondary | ICD-10-CM | POA: Diagnosis not present

## 2022-11-18 DIAGNOSIS — R1013 Epigastric pain: Secondary | ICD-10-CM | POA: Diagnosis not present

## 2022-11-18 DIAGNOSIS — F418 Other specified anxiety disorders: Secondary | ICD-10-CM | POA: Diagnosis not present

## 2022-11-18 DIAGNOSIS — K8689 Other specified diseases of pancreas: Secondary | ICD-10-CM | POA: Diagnosis not present

## 2022-11-18 DIAGNOSIS — R748 Abnormal levels of other serum enzymes: Secondary | ICD-10-CM | POA: Diagnosis not present

## 2022-11-18 DIAGNOSIS — R413 Other amnesia: Secondary | ICD-10-CM | POA: Diagnosis not present

## 2022-11-18 DIAGNOSIS — Z09 Encounter for follow-up examination after completed treatment for conditions other than malignant neoplasm: Secondary | ICD-10-CM | POA: Diagnosis not present

## 2022-11-21 ENCOUNTER — Other Ambulatory Visit: Payer: Self-pay | Admitting: Registered Nurse

## 2022-11-21 DIAGNOSIS — K8689 Other specified diseases of pancreas: Secondary | ICD-10-CM

## 2022-11-23 ENCOUNTER — Encounter: Payer: Self-pay | Admitting: Psychology

## 2022-11-23 DIAGNOSIS — E039 Hypothyroidism, unspecified: Secondary | ICD-10-CM | POA: Insufficient documentation

## 2022-11-23 DIAGNOSIS — I10 Essential (primary) hypertension: Secondary | ICD-10-CM | POA: Insufficient documentation

## 2022-11-23 DIAGNOSIS — E785 Hyperlipidemia, unspecified: Secondary | ICD-10-CM | POA: Insufficient documentation

## 2022-11-23 DIAGNOSIS — K219 Gastro-esophageal reflux disease without esophagitis: Secondary | ICD-10-CM | POA: Insufficient documentation

## 2022-11-23 DIAGNOSIS — F329 Major depressive disorder, single episode, unspecified: Secondary | ICD-10-CM | POA: Insufficient documentation

## 2022-11-24 ENCOUNTER — Ambulatory Visit: Payer: Medicare HMO

## 2022-11-24 ENCOUNTER — Encounter: Payer: Self-pay | Admitting: Psychology

## 2022-11-24 ENCOUNTER — Ambulatory Visit: Payer: Medicare HMO | Admitting: Psychology

## 2022-11-24 DIAGNOSIS — F330 Major depressive disorder, recurrent, mild: Secondary | ICD-10-CM

## 2022-11-24 DIAGNOSIS — F411 Generalized anxiety disorder: Secondary | ICD-10-CM

## 2022-11-24 DIAGNOSIS — R4189 Other symptoms and signs involving cognitive functions and awareness: Secondary | ICD-10-CM

## 2022-11-24 DIAGNOSIS — G319 Degenerative disease of nervous system, unspecified: Secondary | ICD-10-CM | POA: Insufficient documentation

## 2022-11-24 NOTE — Progress Notes (Signed)
NEUROPSYCHOLOGICAL EVALUATION [place]. [facility] Department of Neurology  Date of Evaluation: November 24, 2022  Reason for Referral:   Dezmarie Civello is a 72 y.o. right-handed African-American female referred by Marlowe Kays, PA-C, to characterize her current cognitive functioning and assist with diagnostic clarity and treatment planning in the context of subjective cognitive decline.   Assessment and Plan:   Clinical Impression(s): During testing procedures, Ms. Sklenar very quickly became agitated and expressed a desire to discontinue the evaluation. Encouragement to persist was provided by the psychometrist. This was also separately encouraged by her friend and daughter. However, Ms. Funke was adamant and ultimately refused to continue her participation. As such, the evaluation was discontinued. Scores across stand-alone performance validity measures were consistently below expectation. Given this and notable testing tolerance issues as mentioned above, tasks which were able to be completed are viewed as likely invalid and any interpretation at face value should be undertaken with significant caution.   Given poor testing engagement and validity concerns, I am unable to offer any additional insight into current causes for suspected cognitive impairment. Should Ms. Pratts become more amenable to the testing process in the future, she is encouraged to discuss another referral with Ms. Wertman.  Recommendations: Ms. Gwynneth Munson could consider alternate methods of assessing concerns surrounding an underlying neurodegenerative illness such as a lumbar puncture or blood test. Additional neuroimaging in the form of a PET scan could also be considered.   Performance across neurocognitive testing is not a strong predictor of an individual's safety operating a motor vehicle. Should her family wish to pursue a formalized driving evaluation, they could reach out to the  following agencies: [employer] in [place]: [phone] [facility]: [phone] [facility]: [phone] [facility]: [phone] or [phone]  Should there be progression of current deficits over time, Ms. Mekeel is unlikely to regain any independent living skills lost. Therefore, it is recommended that she remain as involved as possible in all aspects of household chores, finances, and medication management, with supervision to ensure adequate performance. She will likely benefit from the establishment and maintenance of a routine in order to maximize her functional abilities over time.  It will be important for Ms. Shelstad to have another person with her when in situations where she may need to process information, weigh the pros and cons of different options, and make decisions, in order to ensure that she fully understands and recalls all information to be considered.  If not already done, Ms. Duane and her family may want to discuss her wishes regarding durable power of attorney and medical decision making, so that she can have input into these choices. If they require legal assistance with this, long-term care resource access, or other aspects of estate planning, they could reach out to [employer] at [phone] for a free consultation. Additionally, they may wish to discuss future plans for caretaking and seek out community options for in home/residential care should they become necessary.  Ms. Dockett is encouraged to attend to lifestyle factors for brain health (e.g., regular physical exercise, good nutrition habits and consideration of the MIND-DASH diet, regular participation in cognitively-stimulating activities, and general stress management techniques), which are likely to have benefits for both emotional adjustment and cognition. In fact, in addition to promoting good general health, regular exercise incorporating  aerobic activities (e.g., brisk walking, jogging, cycling, etc.) has been demonstrated to be a very effective treatment for depression and stress, with similar  efficacy rates to both antidepressant medication and psychotherapy. Optimal control of vascular risk factors (including safe cardiovascular exercise and adherence to dietary recommendations) is encouraged. Continued participation in activities which provide mental stimulation and social interaction is also recommended.   Important information should be provided to Ms. Vanhorne in written format in all instances. This information should be placed in a highly frequented and easily visible location within her home to promote recall. External strategies such as written notes in a consistently used memory journal, visual and nonverbal auditory cues such as a calendar on the refrigerator or appointments with alarm, such as on a cell phone, can also help maximize recall.  Memory can be improved using internal strategies such as rehearsal, repetition, chunking, mnemonics, association, and imagery. External strategies such as written notes in a consistently used memory journal, visual and nonverbal auditory cues such as a calendar on the refrigerator or appointments with alarm, such as on a cell phone, can also help maximize recall.    When learning new information, she would benefit from information being broken up into small, manageable pieces. she may also find it helpful to articulate the material in her own words and in a context to promote encoding at the onset of a new task. This material may need to be repeated multiple times to promote encoding.  To address problems with processing speed, she may wish to consider:   -Ensuring that she is alerted when essential material or instructions are being presented   -Adjusting the speed at which new information is presented   -Allowing for more time in comprehending, processing, and responding in  conversation   -Repeating and paraphrasing instructions or conversations aloud  To address problems with fluctuating attention and/or executive dysfunction, she may wish to consider:   -Avoiding external distractions when needing to concentrate   -Limiting exposure to fast paced environments with multiple sensory demands   -Writing down complicated information and using checklists   -Attempting and completing one task at a time (i.e., no multi-tasking)   -Verbalizing aloud each step of a task to maintain focus   -Taking frequent breaks during the completion of steps/tasks to avoid fatigue   -Reducing the amount of information considered at one time   -Scheduling more difficult activities for a time of day where she is usually most alert  Review of Records:   Ms. Hemphill was seen by [facility] Marlowe Kays, PA-C) on 06/23/2022 for an evaluation of memory loss. At that time, Ms. Ambrosia reported memory difficulties for the prior two years, around the time of her retirement. Primary examples surrounded generalized forgetfulness, short-term memory decline, and increased repetition in conversation. Her daughters expressed some concern surrounding psychiatric influences on cognitive decline as Ms. Behnen has been dealing with depression over the past several years following the passing of her son, mother, and brother. They also expressed anxiety concerns in stating that Ms. Angelos is "sometimes in a state of fight or flight, she cannot rest." Functionally, she may miss medication doses and her family has largely taken over financial management and bill paying. She was driving at that time without reported difficulty. Performance on a brief cognitive screening instrument (MOCA) was 14/30. Ultimately, Ms. Hemple was referred for a comprehensive neuropsychological evaluation to characterize her cognitive abilities and to assist with diagnostic clarity and treatment planning.   Brain MRI on  07/18/2019 revealed minimal microvascular ischemic disease. Brain MRI on 07/05/2022 revealed mild to moderate generalized cerebral atrophy, mild cerebellar atrophy, and mildly  progressive microvascular ischemic disease.   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 Three variants of uncertain significance were detected - one in the AXIN2 gene called c.1829G>A, one in the PMS2 gene called c.964G>A, and one in the RNF43 gene called c.1114C>T. The report date is 06/24/2019.     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 for blood pressure, Disp: , Rfl: 3    cholecalciferol (VITAMIN D) 1000 UNITS tablet, Take 1,000 Units by mouth daily., Disp: , Rfl:    donepezil (ARICEPT) 10 MG tablet, Take 1 tablet (10 mg total) by mouth daily., Disp: 30 tablet, Rfl: 11   ELDERBERRY PO, Take 1 tablet by mouth daily., Disp: , Rfl:    escitalopram (LEXAPRO) 20 MG tablet, Take 20 mg by mouth daily., Disp: , Rfl:    ezetimibe (ZETIA) 10 MG tablet, Take 10 mg by mouth daily., Disp: , Rfl:    fluticasone (FLONASE) 50 MCG/ACT nasal spray, Place 1 spray into both nostrils daily as needed for allergies or rhinitis., Disp: , Rfl:    methylcellulose (CITRUCEL) oral powder, Take 1 packet by mouth daily., Disp: , Rfl:    Multiple Vitamin (MULTIVITAMIN WITH MINERALS) TABS tablet, Take 1 tablet by mouth daily., Disp: , Rfl:    oxyCODONE-acetaminophen (PERCOCET/ROXICET) 5-325 MG tablet, Take 1 tablet by mouth every 6 (six) hours as needed for severe pain., Disp: 10 tablet, Rfl: 0   pantoprazole (PROTONIX) 20 MG tablet, Take 1 tablet (20 mg total) by mouth daily., Disp: 30 tablet, Rfl: 0   SYNTHROID 75 MCG tablet, Take 75 mcg by mouth daily., Disp: , Rfl:    traMADol (ULTRAM) 50 MG tablet, Take 1-2 tablets (50-100 mg total) by mouth every 6 (six) hours as needed. Up to twice daily as needed for pain (Patient taking differently: Take 50 mg by mouth 2 (two) times daily.), Disp: 30 tablet, Rfl: 1   triamterene-hydrochlorothiazide (MAXZIDE) 75-50 MG per tablet, Take 1 tablet by mouth daily., Disp: , Rfl:  3   TURMERIC PO, Take 1 capsule by mouth daily., Disp: , Rfl:    vitamin C (ASCORBIC ACID) 500 MG tablet, Take 500 mg by mouth daily., Disp: , Rfl:   Clinical Interview:   The following information was obtained during a clinical interview with Ms. Womac and her daughter prior to cognitive testing.  Cognitive Symptoms: Ms. Wissel was quite tangential during interview and a limited historian. Specific to memory, she did acknowledge some trouble recalling details of recent  conversations held with others. Per her perception, difficulties had been present for the past few months. However, when meeting with Ms. Gwynneth Munson this past March, she reported ongoing memory concerns present for the prior several years. She was unable to state her perception of stability versus progressive memory decline. Her daughter expressed concerns surrounding a very poor attention span ("none") and it being much harder for Ms. Leinweber to be present in the moment. She also noted Ms. Siebel having overall short-term memory deterioration, including her becoming increasingly repetitive in day-to-day interactions. Her daughter did express concern surrounding polypharmacy and psychiatric factors as possible influences of ongoing memory dysfunction and attention dysregulation. Personality changes in the form of Ms. Marsland seeming more frustrated and short-tempered were reported. Mild word-finding difficulties were also described.  Difficulties completing ADLs: Somewhat. She noted that her family has begun to take a more involved role in medication management as she has been prone to forgetting doses at times. Her family has fully taken over financial management and bill paying responsibilities. She continues to drive. She did highlight that she got lost while driving the previous day. Her daughter also expressed navigational concerns in that Ms. Crombie has seemingly had greater trouble navigating to well known locations (e.g., her daughter's house).   Additional Medical History: History of traumatic brain injury/concussion: Denied. She did fall while walking in the park about 1-1.5 years prior, ultimately striking her face on the ground and breaking several front teeth. Her daughter stated that Ms. Eltz did not experience a loss in consciousness and that she was not formally diagnosed with a concussion. No other potential head injuries were described.  History of stroke: Denied. History of seizure  activity: Denied. History of known exposure to toxins: Denied. Symptoms of chronic pain: Denied. Experience of frequent headaches/migraines: Endorsed. She reported daily headaches (i.e., "all the time"). She did not state their overall severity and did not appear knowledgeable surrounding potential triggers.  Frequent instances of dizziness/vertigo: Denied.  Sensory changes: Denied. Medical records do suggest tinnitus and bilateral sensorineural hearing loss. Her sense of taste has been disrupted stemming from her front teeth fracture and excessive mucus build-up in her mouth.  Balance/coordination difficulties: She reported ongoing hip, knee, and other joint pain, attributed to arthritis. Pain can impact her balance. She denied any recent falls outside of what was described above about a year or so ago.  Other motor difficulties: Denied.  Sleep History: She was tangential and ultimately unable to provide a numerical estimation surrounding average hours slept on a nightly basis. She was fairly repetitive in stating that she wakes at 7am [place] 9[place]30. She also stated that she may go to sleep around 7pm but ends up lying in bed for an extended period of time which can increase joint pain and stiffness. She did report feeling rested and refreshed upon awakening most mornings. No significant snoring or sleep apnea concerns were reported. Similiarly, no vivid dreaming or REM  sleep behavior concerns were reported.   Psychiatric/Behavioral Health History: She was tangential and ultimately unable to provide direct information surrounding her psychiatric history. She did acknowledge a more longstanding history of major depressive disorder and generalized anxious distress. Her daughter highlighted that much of this psychiatric distress stems from the passing of her son, brother, and mother within the past several years. Her daughter noted that Ms. Veasley had not recovered from these  events. Medical records suggest involvement with a psychiatrist during the current calendar year. Her daughter noted that Ms. Surabian had not seen her psychiatrist for quite some time. She appears to be prescribed Lexapro 20mg  based upon her current medication list within her electronic chart. It was unclear if this medication had remained helpful. Records suggest underlying anxiety in that she is often in a state of fight or flight and cannot rest due to these symptoms. She denied a history of any fully-formed hallucinations or other psychotic behaviors. Current or remote suicidal ideation, intent, or plan was not endorsed.   Tobacco: Denied. Alcohol: She denied current alcohol consumption as well as a history of problematic alcohol abuse or dependence.  Recreational drugs: Denied.  Family History: Family History  Problem Relation Age of Onset   Hypertension Mother    Pancreatic cancer Son 58   Ovarian cancer Maternal Grandmother 57   Pancreatic cancer Half-Brother 31       non-smoker   Lung cancer Maternal Aunt 72       non-smoker   Throat cancer Maternal Aunt        dx. 81s, non-smoker   Breast cancer Cousin 9   Colon cancer Neg Hx    Esophageal cancer Neg Hx    Colon polyps Neg Hx    Rectal cancer Neg Hx    Stomach cancer Neg Hx    This information was confirmed by Ms. Duke Salvia.  Academic/Vocational History: Highest level of educational attainment: 14 years. She graduated from high school and reported completing two additional years of college. She did not report earning a formal Associate's degree. She was quite tangential and it was very difficult to discern what sort of student she was performance wise in academic settings and if she exhibited any notable strengths or weaknesses across academic subjects.   History of developmental delay: Denied. History of grade repetition: Denied. Enrollment in special education courses: Denied. History of LD/ADHD: Denied.  Employment:  Retired. She previously worked for the [employer]. She was somewhat tangential when describing the capacity in which she worked for this organization, ultimately stating that she went various places and performed various actions with the population the organization served.   Evaluation Results:   Behavioral Observations: Ms. Masley was accompanied by a close friend in person and her daughter via speakerphone. She arrived to her appointment on time and was appropriately dressed and groomed. She appeared alert. Observed gait and station were within normal limits. Gross motor functioning appeared intact upon informal observation and no abnormal movements (e.g., tremors) were noted. Her affect was generally relaxed and positive. Spontaneous speech was fluent and word finding difficulties were not observed during the clinical interview. Thought processes were very tangential and it was commonplace for Ms. Sobelman to provide an answer to an asked question which was wholly unrelated to the initial question asked. She appeared a limited historian and the majority of background information and assessment of current functioning was obtained via her medical records and her daughter's description obtained over the phone. Insight into  her cognitive difficulties was difficult to discern given the tangential nature of her responding throughout the interview.   During testing, Ms. Hegel continued to be extremely tangential and quite repetitive. She was also quite impulsive, often attempting to start tasks before instructions had been completed. At one point, she brushed the psychometrist's hand away from the page and attempted to start a task without knowledge of how to perform said task. After a brief period of time, she became agitated and expressed a desire to discontinue the evaluation. Encouragement to persist was provided by the psychometrist. This was also separately encouraged by her friend and daughter.  However, Ms. Pacifico was adamant and ultimately refused to continue her participation. As such, the evaluation was discontinued. Given the lack of test completion, Ms. Kane feedback appointment was cancelled and her and her family made aware. They were encouraged to reschedule at a later date and opted to discuss this and call the office back should they decide to do so in the future.  Adequacy of Effort: The validity of neuropsychological testing is limited by the extent to which the individual being tested may be assumed to have exerted adequate effort during testing. Scores across stand-alone performance validity measures were consistently below expectation. There were also notable testing tolerance issues as described above. As such, obtained test scores are viewed as likely invalid and any interpretation at face value should be undertaken with significant caution.   Test Results: Tasks which were completed will be mentioned below in order to complete her testing record. As stated above, significant caution is advised surrounding any test interpretation.    Ms. Mease was poorly oriented at the time of the current evaluation. She was unable to state her phone number. She was also unable to state the current year ("2004"), date, time ("1:00" despite her being scheduled for an 8:30am appointment), and name of the current clinic. When asked why she was here, she reiterated a previously told story surrounding her breaking her front teeth.   Intellectual abilities based upon educational and vocational attainment were estimated to be in the average range. Premorbid abilities were estimated to be within the well below average range based upon a single-word reading test.   Performance on a rapid numerical sequencing task assessing processing speed was below average. No other processing speed tasks were able to be completed. Attention and concentration was unable to be assessed. Performance on an  alternating alphanumeric sequencing task assessing cognitive flexibility was exceptionally low. Another set-shifting verbal fluency task was also exceptionally low. No other tasks assessing executive functioning were able to be completed.  Receptive language abilities were unable to be assessed. Verbal fluency (both phonemic and semantic) was exceptionally low. Confrontation naming was unable to be assessed.  Her drawing of a clock was notably impaired. She included the number 12 twice, as well as the numbers 13-16 on the clock face. When asked to draw the clock hands, she drew a large plus sign (+) in the middle of the clock and then a single arrow originating from the circle's outer edge to the center of this sign. Other aspects of visuospatial/visuoconstructional abilities were unable to be assessed.   Learning and memory was unable to be assessed as she discontinued the evaluation and refused to further participate in the middle of memory testing.    She was unwilling to complete mood-related questionnaires.   Tables of Scores:   Note: This summary of test scores accompanies the interpretive report and should not be  considered in isolation without reference to the appropriate sections in the text. Descriptors are based on appropriate normative data and may be adjusted based on clinical judgment. Terms such as "Within Normal Limits" and "Outside Normal Limits" are used when a more specific description of the test score cannot be determined.       Percentile - Normative Descriptor > 98 - Exceptionally High 91-97 - Well Above Average 75-90 - Above Average 25-74 - Average 9-24 - Below Average 2-8 - Well Below Average < 2 - Exceptionally Low       Validity:   DESCRIPTOR       ACS WC: --- --- Outside Normal Limits  DCT: --- --- Outside Normal Limits       Orientation:      Raw Score Percentile   NAB Orientation, Form 1 21/29 --- ---       Cognitive Screening:      Raw Score Percentile    SLUMS: 8/30 --- ---       RBANS, Form A: Standard Score/ Scaled Score Percentile   Total Score --- --- ---  Immediate Memory --- --- ---    List Learning 1 <1 Exceptionally Low    Story Memory Discontinued (pt refused) --- ---  Visuospatial/Constructional --- --- ---    Figure Copy Evaluation ended  by pt --- ---    Line Orientation --- --- ---  Language --- --- ---    Picture Naming --- --- ---    Semantic Fluency --- --- ---  Attention --- --- ---    Digit Span --- --- ---    Coding --- --- ---  Delayed Memory --- --- ---    List Recall --- --- ---    List Recognition --- --- ---    Story Recall --- --- ---    Story Recognition --- --- ---    Figure Recall --- --- ---    Figure Recognition --- --- ---        Intellectual Functioning:      Standard Score Percentile   Test of Premorbid Functioning: 74 4 Well Below Average       Attention/Executive Function:     Trail Making Test (TMT): Raw Score (T Score) Percentile     Part A 60 secs.,  0 errors (40) 16 Below Average    Part B Discontinued --- Impaired        D-KEFS Verbal Fluency Test: Raw Score (Scaled Score) Percentile     Letter Total Correct 12 (3) 1 Exceptionally Low    Category Total Correct 11 (1) <1 Exceptionally Low    Category Switching Total Correct 6 (2) <1 Exceptionally Low    Category Switching Accuracy 1 (1) <1 Exceptionally Low      Total Set Loss Errors 1 (11) 63 Average      Total Repetition Errors 3 (10) 50 Average       Language:     Verbal Fluency Test: Raw Score (T Score) Percentile     Phonemic Fluency (FAS) 12 (25) 1 Exceptionally Low    Animal Fluency 7 (27) 1 Exceptionally Low        Visuospatial/Visuoconstruction:      Raw Score Percentile   Clock Drawing: 3/10 --- Impaired   Informed Consent and Coding/Compliance:   The current evaluation represents a clinical evaluation for the purposes previously outlined by the referral source and is in no way reflective of a forensic  evaluation.   Ms. Cather was provided with  a verbal description of the nature and purpose of the present neuropsychological evaluation. Also reviewed were the foreseeable risks and/or discomforts and benefits of the procedure, limits of confidentiality, and mandatory reporting requirements of this provider. The patient was given the opportunity to ask questions and receive answers about the evaluation. Oral consent to participate was provided by the patient.   This evaluation was conducted by Newman Nickels, Ph.D., ABPP-CN, board certified clinical neuropsychologist. Ms. Rozelle completed a clinical interview with Dr. Milbert Coulter, billed as one unit [phone], and 75 minutes of cognitive testing and scoring, billed as one unit [phone] and one additional units [id number]. Psychometrist Shan Levans, B.S. assisted Dr. Milbert Coulter with test administration and scoring procedures. As a separate and discrete service, one unit [id number] and one unit [phone] were billed for Dr. Tammy Sours time spent in interpretation and report writing.

## 2022-11-24 NOTE — Progress Notes (Signed)
   Psychometrician Note   Cognitive testing was administered to Karen [name] by Shan Levans, B.S. (psychometrist) under the supervision of Dr. Newman Nickels, Ph.D., licensed psychologist on 11/24/2022. Ms. Gornto did not appear overtly distressed by the testing session per behavioral observation or responses across self-report questionnaires. Rest breaks were offered.    The battery of tests administered was selected by Dr. Newman Nickels, Ph.D. with consideration to Ms. Kakos's current level of functioning, the nature of her symptoms, emotional and behavioral responses during interview, level of literacy, observed level of motivation/effort, and the nature of the referral question. This battery was communicated to the psychometrist. Communication between Dr. Newman Nickels, Ph.D. and the psychometrist was ongoing throughout the evaluation and Dr. Newman Nickels, Ph.D. was immediately accessible at all times. Dr. Newman Nickels, Ph.D. provided supervision to the psychometrist on the date of this service to the extent necessary to assure the quality of all services provided.    Sinia Heaslip will return within approximately 1-2 weeks for an interactive feedback session with Dr. Milbert Coulter at which time her test performances, clinical impressions, and treatment recommendations will be reviewed in detail. Ms. Riach understands she can contact our office should she require our assistance before this time.  A total of 75 minutes of billable time were spent face-to-face with Ms. Wyler by the psychometrist. This includes both test administration and scoring time. Billing for these services is reflected in the clinical report generated by Dr. Newman Nickels, Ph.D.  This note reflects time spent with the psychometrician and does not include test scores or any clinical interpretations made by Dr. Milbert Coulter. The full report will follow in a separate note.

## 2022-12-22 DIAGNOSIS — F418 Other specified anxiety disorders: Secondary | ICD-10-CM | POA: Diagnosis not present

## 2022-12-22 DIAGNOSIS — K8689 Other specified diseases of pancreas: Secondary | ICD-10-CM | POA: Diagnosis not present

## 2022-12-22 DIAGNOSIS — R413 Other amnesia: Secondary | ICD-10-CM | POA: Diagnosis not present

## 2022-12-23 ENCOUNTER — Encounter: Payer: Self-pay | Admitting: Physician Assistant

## 2022-12-23 ENCOUNTER — Ambulatory Visit: Payer: Medicare HMO | Admitting: Physician Assistant

## 2022-12-23 VITALS — BP 140/88 | HR 75 | Ht 64.0 in | Wt 122.0 lb

## 2022-12-23 DIAGNOSIS — R9389 Abnormal findings on diagnostic imaging of other specified body structures: Secondary | ICD-10-CM

## 2022-12-23 DIAGNOSIS — R634 Abnormal weight loss: Secondary | ICD-10-CM | POA: Diagnosis not present

## 2022-12-23 DIAGNOSIS — K5900 Constipation, unspecified: Secondary | ICD-10-CM

## 2022-12-23 DIAGNOSIS — K649 Unspecified hemorrhoids: Secondary | ICD-10-CM

## 2022-12-23 NOTE — Patient Instructions (Signed)
_______________________________________________________  If your blood pressure at your visit was 140/90 or greater, please contact your primary care physician to follow up on this.  _______________________________________________________  If you are age 70 or older, your body mass index should be between 23-30. Your Body mass index is 20.94 kg/m. If this is out of the aforementioned range listed, please consider follow up with your Primary Care Provider.  If you are age 76 or younger, your body mass index should be between 19-25. Your Body mass index is 20.94 kg/m. If this is out of the aformentioned range listed, please consider follow up with your Primary Care Provider.   ________________________________________________________  The [facility] providers would like to encourage you to use [facility] to communicate with providers for non-urgent requests or questions.  Due to long hold times on the telephone, sending your provider a message by [facility] may be a faster and more efficient way to get a response.  Please allow 48 business hours for a response.  Please remember that this is for non-urgent requests.  _______________________________________________________  Please purchase the following medications over the counter and take as directed: Preparation H   Can try warm apple juice and prunes  You have been scheduled for an MRI at [facility] on 01-06-2023. Your appointment time is 9am. Please arrive to admitting (at main entrance of the hospital) 30 minutes prior to your appointment time for registration purposes. Please make certain not to have anything to eat or drink 6 hours prior to your test. In addition, if you have any metal in your body, have a pacemaker or defibrillator, please be sure to let your ordering physician know. This test typically takes 45 minutes to 1 hour to complete. Should you need to reschedule, please call [phone] to do so.  It was a pleasure to see  you today!  Thank you for trusting me with your gastrointestinal care!

## 2022-12-23 NOTE — Progress Notes (Signed)
Chief Complaint: weight loss, abdominal pain and abnormal CT of the abdomen  HPI:       Karen [name] is a 72 year old female with memory impairment with a past medical history as listed below including pancreatic cancer, previously known to Dr. Christella Hartigan, who presents to clinic today with a complaint of weight loss, abdominal pain and abnormal CT of the abdomen.      09/2019  Colonoscopy for constipation with skin tags, 2 subcentimeter adenomas and diverticulosis as well as hemorrhoids, repeat recommended in 7 years.      09/2019 EGD with normal, GE junction empirically dilated due to symptom of dysphagia.  Pathology with no H. pylori.      02/12/2021 office visit with Dr. Christella Hartigan.  At that time discussed significant history of pancreatic cancer including 2 first-degree relatives who had been diagnosed with colon cancer and passed away from it.  This was a half-brother and a son.  Previously had been recommended for endoscopic ultrasound for high risk of pancreatic cancer and screening.  She was reassured that she did not need a repeat colonoscopy and would not need one until 2028.  Recommended a fiber supplement for constipation.  At that time were going to follow-up on the MRI/MRCP and schedule her for an EUS in 6 months.  Patient never had EUS.    02/11/2021 MRI/MRCP with biliary sludge lying dependently in the gallbladder without evidence to suggest acute cholecystitis.    11/11/2022 patient seen in the ER for 2 to 3 days of abdominal pain and a weight loss of about 20 pounds over the past 3 months.  Her teeth had been removed and she was not eating well.    11/11/2022 CT then pelvis with contrast with minimal pancreatic duct prominence within the body of the pancreas measuring up to 3 mm.    11/11/2022 CMP with a glucose of 119, calcium minimally elevated at 10.4 and protein minimally elevated 8.3 and otherwise normal.  Lipase minimally elevated at 74.  CBC normal.    Today, the patient presents to  clinic accompanied by her daughter who does assist with her care.  She is able to provide most of the history and keep the patient on track as her mind does wander during our conversation she keeps bringing up her fall from her hip and pain on her right side.  Daughter tells me that she is worried given that patient has lost around 30 to 40 pounds over the past year, but this was also after falling and losing all of her top teeth and apparently has not had anyone at home with her making sure that she is eating and does have issues with memory loss, since then they have gotten home health over the past couple of weeks and her weight loss seems to have stabilized for now and she seems to be doing much better.  Was also previously complaining of a lot of abdominal pain but they think she was taking all of her medicines on an empty stomach which is likely exacerbating things.  This has also gotten less recently.  Previously had issues with her bowels but tells me that they move about twice a week now, does ask what she can do to make sure that they continue moving.  Daughter tells me she occasionally has issues with hemorrhoids as well.  Daughter really wants her pancreas checked out as this was found to be abnormal on recent CT.    Denies fever, chills, continued weight  loss, blood in her stool or symptoms that awaken her from sleep.  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 Three variants of  uncertain significance were detected - one in the AXIN2 gene called c.1829G>A, one in the PMS2 gene called c.964G>A, and one in the RNF43 gene called c.1114C>T. The report date is 06/24/2019.     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     atenolol (TENORMIN) 50 MG tablet Take 50 mg by mouth daily. for blood pressure  3   cholecalciferol (VITAMIN D) 1000 UNITS tablet Take 1,000 Units by mouth daily.     donepezil (ARICEPT) 10 MG tablet Take 1 tablet (10 mg total) by mouth daily. 30 tablet 11   ELDERBERRY PO Take 1 tablet by mouth daily.     escitalopram (LEXAPRO) 20 MG tablet Take 20 mg by mouth daily.     ezetimibe (ZETIA) 10 MG tablet Take 10 mg by mouth daily.     fluticasone (FLONASE) 50 MCG/ACT nasal spray Place 1 spray into both nostrils daily as needed for allergies or rhinitis.     methylcellulose (CITRUCEL) oral powder Take 1 packet by mouth daily.     Multiple Vitamin (MULTIVITAMIN WITH MINERALS) TABS tablet Take 1 tablet by mouth daily.     oxyCODONE-acetaminophen (PERCOCET/ROXICET) 5-325 MG tablet Take 1 tablet by mouth every 6 (six) hours as needed for severe pain. 10 tablet 0    pantoprazole (PROTONIX) 20 MG tablet Take 1 tablet (20 mg total) by mouth daily. 30 tablet 0   SYNTHROID 75 MCG tablet Take 75 mcg by mouth daily.     traMADol (ULTRAM) 50 MG tablet Take 1-2 tablets (50-100 mg total) by mouth every 6 (six) hours as needed. Up to twice daily as needed for pain (Patient taking differently: Take 50 mg by mouth 2 (two) times daily.) 30 tablet 1   triamterene-hydrochlorothiazide (MAXZIDE) 75-50 MG per tablet Take 1 tablet by mouth daily.  3   TURMERIC PO Take 1 capsule by mouth daily.  vitamin C (ASCORBIC ACID) 500 MG tablet Take 500 mg by mouth daily.     No current facility-administered medications for this visit.    Allergies as of 12/23/2022 - Review Complete 11/11/2022  Allergen Reaction Noted   Zoloft [sertraline hcl] Other (See Comments) 12/31/2014    Family History  Problem Relation Age of Onset   Hypertension Mother    Pancreatic cancer Son 64   Ovarian cancer Maternal Grandmother 75   Pancreatic cancer Half-Brother 43       non-smoker   Lung cancer Maternal Aunt 72       non-smoker   Throat cancer Maternal Aunt        dx. 67s, non-smoker   Breast cancer Cousin 54   Colon cancer Neg Hx    Esophageal cancer Neg Hx    Colon polyps Neg Hx    Rectal cancer Neg Hx    Stomach cancer Neg Hx     Social History   Socioeconomic History   Marital status: Married    Spouse name: Not on file   Number of children: 4   Years of education: 14   Highest education level: Some college, no degree  Occupational History   Occupation: Retired  Tobacco Use   Smoking status: Former    Current packs/day: 0.00    Types: Cigarettes    Start date: 04/18/1990    Quit date: 04/18/2000    Years since quitting: 22.6   Smokeless tobacco: Never   Tobacco comments:    LIGHT SMOKER did dip in 20's   Vaping Use   Vaping status: Never Used  Substance and Sexual Activity   Alcohol use: No   Drug use: Not Currently    Comment: remote college MJ use   Sexual  activity: Not on file  Other Topics Concern   Not on file  Social History Narrative   Right handed   Drinks caffeine, coffee   Lives with husband and granddaughter    Two story home   retired   Chief Executive Officer Determinants of Corporate investment banker Strain: Not on file  Food Insecurity: No Food Insecurity (03/23/2022)   Hunger Vital Sign    Worried About Running Out of Food in the Last Year: Never true    Ran Out of Food in the Last Year: Never true  Transportation Needs: No Transportation Needs (03/23/2022)   PRAPARE - Administrator, Civil Service (Medical): No    Lack of Transportation (Non-Medical): No  Physical Activity: Not on file  Stress: Not on file  Social Connections: Not on file  Intimate Partner Violence: Not on file    Review of Systems:    Constitutional: No fever or chills Cardiovascular: No chest pain  Respiratory: No SOB  Gastrointestinal: See HPI and otherwise negative   Physical Exam:  Vital signs: BP (!) 140/88   Pulse 75   Ht 5\' 4"  (1.626 m)   Wt 122 lb (55.3 kg)   BMI 20.94 kg/m    Constitutional:   Pleasant Elderly AA female appears to be in NAD, Well developed, Well nourished, alert and cooperative Respiratory: Respirations even and unlabored. Lungs clear to auscultation bilaterally.   No wheezes, crackles, or rhonchi.  Cardiovascular: Normal S1, S2. No MRG. Regular rate and rhythm. No peripheral edema, cyanosis or pallor.  Gastrointestinal:  Soft, nondistended, nontender. No rebound or guarding. Normal bowel sounds. No appreciable masses or hepatomegaly. Rectal:  Not performed.  Psychiatric: Demonstrates good judgement and reason  without abnormal affect or behaviors. +impaired memory and can't stay focused  RELEVANT LABS AND IMAGING: CBC    Component Value Date/Time   WBC 5.7 11/11/2022 1313   RBC 4.61 11/11/2022 1313   HGB 13.0 11/11/2022 1313   HCT 42.7 11/11/2022 1313   PLT 288 11/11/2022 1313   MCV 92.6 11/11/2022 1313   MCH  28.2 11/11/2022 1313   MCHC 30.4 11/11/2022 1313   RDW 11.9 11/11/2022 1313   LYMPHSABS 1.6 01/07/2021 1235   MONOABS 0.4 01/07/2021 1235   EOSABS 0.0 01/07/2021 1235   BASOSABS 0.0 01/07/2021 1235    CMP     Component Value Date/Time   NA 140 11/11/2022 1313   K 3.8 11/11/2022 1313   CL 101 11/11/2022 1313   CO2 30 11/11/2022 1313   GLUCOSE 119 (H) 11/11/2022 1313   BUN 10 11/11/2022 1313   CREATININE 0.58 11/11/2022 1313   CALCIUM 10.4 (H) 11/11/2022 1313   PROT 8.3 (H) 11/11/2022 1313   ALBUMIN 4.0 11/11/2022 1313   AST 34 11/11/2022 1313   ALT 29 11/11/2022 1313   ALKPHOS 43 11/11/2022 1313   BILITOT 0.5 11/11/2022 1313   GFRNONAA >60 11/11/2022 1313   GFRAA >60 01/01/2015 0227    Assessment: 1.  Abnormal CT of the pancreas: With dilated pancreatic duct 2.  Family history of pancreatic cancer: In her son and half brother, previous surveillance with MRI/MRCP, the last 2 years ago 3.  Constipation: Describes a bowel movement at least twice a week 4.  Hemorrhoids: Off-and-on, likely related to above 5.  Weight loss: About 30 to 40 pounds over the past year or so, a lot of this occurred after she had a fall and lost her teeth, now stable over the past week or so since he had a home health aide making sure that she eats; likely multifactorial  Plan: 1.  Given patient's family history of pancreatic cancer and new findings of pancreatic duct enlargement on CT as well as weight loss (though this is likely multifactorial), ordered a follow-up MRI/MRCP for pancreatic cancer screening.  In the past it had been discussed that she may need an EUS, her family member would like to wait on this until the MRI. 2.  Encouraged the patient to use warm apple juice and/or prunes to help with constipation.  This is how her daughter would like it handled at first.  Also discussed using MiraLAX if needed. 3.  Discussed weight loss, it sounds like this is stabilized now that they have some home  health care and someone is making sure that she eats throughout the day.  Stomach pain is also gotten less now that she is not taking her medicines on empty stomach. 4.  Discussed over-the-counter Preparation H suppositories and/or cream for hemorrhoids, if this does not help then could consider hydrocortisone 5.  Patient to follow in clinic per recommendations after time of imaging.  She was assigned to Dr. Leonides Schanz this afternoon.  Hyacinth Meeker, PA-C [facility] 12/23/2022, 1:31 PM  Cc: Fatima Sanger, FNP

## 2022-12-23 NOTE — Progress Notes (Signed)
I agree with the assessment and plan as outlined by Ms. Lemmon. 

## 2023-01-04 ENCOUNTER — Telehealth: Payer: Self-pay

## 2023-01-04 NOTE — Telephone Encounter (Signed)
Patient's daughter said she can't have xanax. Can you please advise?

## 2023-01-04 NOTE — Telephone Encounter (Signed)
Daughter calling asking if we can prescribe anything that will help the patient mellow out for her MRI? She said the patient is scared of being in enclosed places and she doesn't stay still. She moves at least every 15 minutes and she has even walked out of her appointment before. Please advise  Call back number for daughter is [phone]

## 2023-01-05 MED ORDER — LORAZEPAM 2 MG PO TABS: 2.0000 mg | ORAL_TABLET | ORAL | 0 refills | Status: DC

## 2023-01-05 NOTE — Telephone Encounter (Signed)
Script sent. It was faxed

## 2023-01-06 ENCOUNTER — Other Ambulatory Visit: Payer: Self-pay | Admitting: Physician Assistant

## 2023-01-06 ENCOUNTER — Ambulatory Visit (HOSPITAL_COMMUNITY)
Admission: RE | Admit: 2023-01-06 | Discharge: 2023-01-06 | Disposition: A | Discharge status: Home / Self Care | Payer: Medicare HMO | Source: Ambulatory Visit | Attending: Physician Assistant | Admitting: Physician Assistant

## 2023-01-06 DIAGNOSIS — K5900 Constipation, unspecified: Secondary | ICD-10-CM | POA: Diagnosis not present

## 2023-01-06 DIAGNOSIS — R634 Abnormal weight loss: Secondary | ICD-10-CM

## 2023-01-06 DIAGNOSIS — K8689 Other specified diseases of pancreas: Secondary | ICD-10-CM | POA: Diagnosis not present

## 2023-01-06 DIAGNOSIS — R9389 Abnormal findings on diagnostic imaging of other specified body structures: Secondary | ICD-10-CM

## 2023-01-06 DIAGNOSIS — K649 Unspecified hemorrhoids: Secondary | ICD-10-CM

## 2023-01-06 DIAGNOSIS — N281 Cyst of kidney, acquired: Secondary | ICD-10-CM | POA: Diagnosis not present

## 2023-01-06 DIAGNOSIS — K7689 Other specified diseases of liver: Secondary | ICD-10-CM | POA: Diagnosis not present

## 2023-01-06 MED ORDER — GADOBUTROL 1 MMOL/ML IV SOLN: 5.0000 mL | Freq: Once | INTRAVENOUS | Status: DC | PRN

## 2023-01-08 ENCOUNTER — Other Ambulatory Visit: Payer: Self-pay | Admitting: Physician Assistant

## 2023-01-17 ENCOUNTER — Other Ambulatory Visit: Payer: Self-pay

## 2023-01-17 DIAGNOSIS — Q453 Other congenital malformations of pancreas and pancreatic duct: Secondary | ICD-10-CM

## 2023-01-17 DIAGNOSIS — R9389 Abnormal findings on diagnostic imaging of other specified body structures: Secondary | ICD-10-CM

## 2023-01-20 ENCOUNTER — Telehealth: Payer: Self-pay | Admitting: Physician Assistant

## 2023-01-20 NOTE — Telephone Encounter (Signed)
Patients daughter called stating that the PCP sent patient a new RX hydroxyzine 25 mg , to make sure there are no interference with the medications that is prescribed now by the PA

## 2023-01-20 NOTE — Telephone Encounter (Signed)
Should not react with Donepezil. Thanks

## 2023-01-23 NOTE — Telephone Encounter (Signed)
I left detailed message that it should be okay to take with donepezil. To call back if any questions.

## 2023-02-01 ENCOUNTER — Other Ambulatory Visit: Payer: Self-pay | Admitting: Registered Nurse

## 2023-02-01 DIAGNOSIS — Z1231 Encounter for screening mammogram for malignant neoplasm of breast: Secondary | ICD-10-CM

## 2023-02-17 ENCOUNTER — Telehealth: Payer: Self-pay | Admitting: *Deleted

## 2023-02-17 ENCOUNTER — Telehealth: Payer: Self-pay | Admitting: Physician Assistant

## 2023-02-17 DIAGNOSIS — R413 Other amnesia: Secondary | ICD-10-CM | POA: Diagnosis not present

## 2023-02-17 DIAGNOSIS — F419 Anxiety disorder, unspecified: Secondary | ICD-10-CM | POA: Diagnosis not present

## 2023-02-17 DIAGNOSIS — F5101 Primary insomnia: Secondary | ICD-10-CM | POA: Diagnosis not present

## 2023-02-17 DIAGNOSIS — R630 Anorexia: Secondary | ICD-10-CM | POA: Diagnosis not present

## 2023-02-17 DIAGNOSIS — R634 Abnormal weight loss: Secondary | ICD-10-CM | POA: Diagnosis not present

## 2023-02-17 NOTE — Telephone Encounter (Signed)
-----   Message from [facility] sent at 02/17/2023  2:08 PM EDT ----- Alona Bene, Please see reply on telephone note. GM ----- Message ----- From: Avanell Shackleton, RN Sent: 02/17/2023   1:49 PM EDT To: Unk Lightning, PA; #  See below, not sure if you received the telephone note routed to you. Not able to see if were routed.   Called the patient's daughter in reference to the patient's PCP recommending having a EGD/Colonoscopy done at the hospital. Daughter states the patient has an appt scheduled at [facility] for a EGD and wants to add on an colonoscopy if possible. States she understands it may effect the already scheduled EGD and if the schedule has to be changed, that would be ok. Please advise

## 2023-02-17 NOTE — Telephone Encounter (Signed)
Called the patient's daughter to inform her of Dr. Elesa Hacker recommendations and instructions regarding the moving of the patient's schedule for he EUS and the inability to have a colonoscopy added at this time. The patient presently has EUS scheduled for 03/20/23. Called and left message on VM. Ask that the daughter call me if any questions at [phone].

## 2023-02-17 NOTE — Telephone Encounter (Signed)
Called the patient's daughter in reference to the patient's PCP recommending having a EGD/Colonoscopy done at the hospital. Daughter states the patient has an appt scheduled at [facility] for a EGD and wants to add on an colonoscopy if possible. States she understands it may effect the already scheduled EGD and if the schedule has to be changed, that would be ok. Please advise

## 2023-02-17 NOTE — Telephone Encounter (Signed)
Patients daughter called and stated the patients PCP is recommending she get a colonoscopy along with the EGD procedure scheduled at the hospital.on 1202/24. Please advise.

## 2023-02-17 NOTE — Telephone Encounter (Signed)
I cannot currently accommodate changes to the date. She only needs the Upper EUS in the hospital, because that is where I perform this procedure. My availability for hospital time is for advanced cases at this point in time. If colonoscopy is needed, will need to be with primary GI and if no contraindication could be done in the [facility]. If there are compounding factors to her needing her Colonoscopy to be in the hospital (she cannot for some reason have it in the [facility]), then I will see what may be possible, but at this point I decline ability to add-on Colonoscopy to the currently scheduled December EUS date. GM

## 2023-02-17 NOTE — Telephone Encounter (Signed)
Okay for her to get scheduled for a colonoscopy with me in the [facility] for weight loss. Alona Bene, please arrange for a 2-day prep.

## 2023-02-17 NOTE — Telephone Encounter (Signed)
Inbound call from patient's daughter returning previous call. States she was unable to hear full voicemail and is requesting a call back. Please advise, thank you.

## 2023-02-17 NOTE — Telephone Encounter (Signed)
Patient's daughter called back and states the PCP recommended the colonoscopy because the patient has blood noted in her stool and she has lost 80 lbs. Please advise.

## 2023-02-17 NOTE — Telephone Encounter (Signed)
Left VM on phone  

## 2023-02-17 NOTE — Telephone Encounter (Signed)
Forwarding to primary GI. GM

## 2023-02-20 ENCOUNTER — Encounter: Payer: Self-pay | Admitting: *Deleted

## 2023-02-20 ENCOUNTER — Other Ambulatory Visit: Payer: Self-pay | Admitting: *Deleted

## 2023-02-20 DIAGNOSIS — R634 Abnormal weight loss: Secondary | ICD-10-CM

## 2023-02-20 MED ORDER — NA SULFATE-K SULFATE-MG SULF 17.5-3.13-1.6 GM/177ML PO SOLN
1.0000 | Freq: Once | ORAL | 0 refills | Status: AC
Start: 2023-02-20 — End: 2023-02-20

## 2023-02-20 NOTE — Telephone Encounter (Signed)
This encounter has been addressed. Please note telephone note.

## 2023-02-20 NOTE — Telephone Encounter (Signed)
Patient's daughter called to verify that patient is not diabetic, or taking diabetic medications, nor is patient presently on blood thinners. Daughter verified no diabetic meds nor blood thinners. Orders placed for ambulatory GI referral and ordered Suprep. Instructions placed at front desk for pick up.

## 2023-02-21 NOTE — Telephone Encounter (Signed)
Called the patient's daughter to make sure she had a clear understanding the patient should have the 2 day colonoscopy suprep due to the patient's hx of constipation per Dr. Leonides Schanz. Daughter understood and agreed.

## 2023-02-23 ENCOUNTER — Encounter: Payer: Self-pay | Admitting: Physician Assistant

## 2023-02-23 ENCOUNTER — Ambulatory Visit: Payer: Medicare HMO | Admitting: Physician Assistant

## 2023-02-23 VITALS — BP 132/74 | HR 98 | Resp 18 | Ht 64.0 in

## 2023-02-23 DIAGNOSIS — R413 Other amnesia: Secondary | ICD-10-CM

## 2023-02-23 MED ORDER — DONEPEZIL HCL 10 MG PO TABS: 10.0000 mg | ORAL_TABLET | Freq: Every day | ORAL | 3 refills | Status: DC

## 2023-02-23 NOTE — Progress Notes (Signed)
Assessment/Plan:   Memory Impairment of unclear etiology  Karen [name] is a very pleasant 72 y.o. RH female with a history of hypertension, hyperlipidemia, arthritis with chronic pain, hypothyroidism, insomnia, depression presenting today in follow-up for evaluation of memory loss.  She was to have a neuropsych evaluation on 11/24/2022, but notes indicate that the patient became very agitated, and wanted to discontinue the evaluation therefore no formal diagnosis has been obtained.  Patient is on donepezil 10 mg daily. She is stable from cognitive standpoint. Mood controlled with PCP/psych     Recommendations:   Follow up in 6  months. Continue donepezil 10 mg daily. Side effects discussed Discussed proceeding with lumbar puncture for diagnosis, family declines for now.  Recommend good control of cardiovascular risk factors Continue to control mood as per PCP and psychiatry for depression and severe anxiety     Subjective:   This patient is accompanied in the office by her daughter  who supplements the history. Previous records as well as any outside records available were reviewed prior to todays visit.   Patient was last seen on 08/23/2022.  Last MoCA on 06/23/2022 was 14/30.    Any changes in memory since last visit? " About the same". Patient has some difficulty remembering recent conversations and people names. Daughter reports as before, she may have unsolved issues with depression and ADHD.  repeats oneself?  Endorsed "constantly" Disoriented when walking into a room?  Patient denies    Misplacing objects?  Patient denies   Wandering behavior?   denies   Any personality changes since last visit?   denies   Any worsening depression?: She does not know. She is not sure if her statins are interfering with the psych meds-daughter says    Hallucinations or paranoia?  denies   Seizures?   denies    Any sleep changes?  Does not sleep very well . Takes Remeron for it. Denies  vivid dreams, REM behavior or sleepwalking   Sleep apnea?   denies    Any hygiene concerns?   denies   Independent of bathing and dressing?  Endorsed  Does the patient needs help with medications? Patient is in charge   Who is in charge of the finances?  Patient is in charge   Any changes in appetite?  Decreased, she is increasing intake and also taking Ensure. She has scheduled endoscopy soon to further evaluate etiology of her weight loss. Patient have trouble swallowing?  denies   Does the patient cook?  Any kitchen accidents such as leaving the stove on?   denies   Any headaches?    Denies.   Vision changes? Denies. Chronic pain?  Off of tramadol "because  interacts with depression med", so she only takes tylenol arthritis with good relief.  Ambulates with difficulty?  She does no want to uses a walker. [facility]  Recent falls or head injuries?  denies      Unilateral weakness, numbness or tingling?   denies   Any tremors?  denies   Any anosmia?    denies   Any incontinence of urine? Urge incontinence Any bowel dysfunction?  denies      Patient lives with her husband Does the patient drive? Took the keys from her because she got lost twice, "so we controlled to where she goes, only short distances such as to [employer]". .      Initial visit 06/23/22  How long did patient have memory difficulties?  Patient reports that she may have some memory difficulties since 2 years after retirement.  However, situational depression may have played a role according to her daughters.  "Sometimes she is in a state of fight or flight, she cannot rest, especially after the death of her son, brother and mother over the last 8 years ".  Daughters report that the patient may have had underlying ADHD, but he was never diagnosed. repeats oneself?  Endorsed, "constantly "-daughter says.  She constantly talks about wanting to become needed in the behavioral hospital Disoriented when walking into  a room?  Patient denies  Leaving objects in unusual places? denies   Wandering behavior?  denies   Any personality changes since last visit?  Her daughter's report increased anxiety, and "she can never rest "she is also very tangential in her responses. Any worsening depression?:  As mentioned above, the patient lost several members of her family initial.  Of time, which brought situational depression, and anxiety. Hallucinations or paranoia? Denies   Seizures?   Patient denies    Any sleep changes?  She goes to sleep at 1 am but not getting enough sleep; denies vivid dreams, REM behavior or sleepwalking   Sleep apnea?  "I don't know" .  Any hygiene concerns?  Patient denies   Independent of bathing and dressing?  Endorsed  Does the patient needs help with medications? She tries to take care of it, but she may miss some medications Who is in charge of the finances?   Daughters are in charge Any changes in appetite?   Decreased Patient have trouble swallowing?   denies   Does the patient cook? Yes    Any kitchen accidents such as leaving the stove on? Patient denies   Any headaches?   denies   Chronic back pain  denies   Ambulates with difficulty?    She fell and hit the L hip and since then her mobility is limited, but does not want to use a cane Recent falls or head injuries? No others Unilateral weakness, numbness or tingling?  denies   Any tremors?   denies   Any anosmia?  denies   Any incontinence of urine? Denies  Any bowel dysfunction?  Constipation .     Patient lives  with husband  History of heavy alcohol intake? denies   History of heavy tobacco use?  denies   Family history of dementia?   denies  Does patient drive? Endorsed, "she is great on the highways, but in the city not so good" Retired Psychologist, counselling, life coach      Labs 06/2022 TSH 2.87  B12 >1500    MRI of the brain personally reviewed, remarkable for mild chronic small vessel ischemic changes within the  cerebral white matter, mild to moderate generalized cerebral atrophy and mild cerebellar atrophy, no acute findings.   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 Three variants  of uncertain significance were detected - one in the AXIN2 gene called c.1829G>A, one in the PMS2 gene called c.964G>A, and one in the RNF43 gene called c.1114C>T. The report date is 06/24/2019.     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   atenolol (TENORMIN) 50 MG tablet Take 50 mg by mouth daily. for blood pressure   LORazepam (ATIVAN) 2 MG tablet Take  1 tablet (2 mg total) by mouth as directed. Take 20-30 minutes before MRI procedure   traMADol (ULTRAM) 50 MG tablet Take 1-2 tablets (50-100 mg total) by mouth every 6 (six) hours as needed. Up to twice daily as needed for pain   [DISCONTINUED] donepezil (ARICEPT) 10 MG tablet Take 1 tablet (10 mg total) by mouth daily.   cholecalciferol (VITAMIN D) 1000 UNITS tablet Take 1,000 Units by mouth daily. (Patient not taking: Reported on 12/23/2022)   donepezil (ARICEPT) 10 MG tablet Take 1 tablet (10 mg total) by mouth daily.   ELDERBERRY PO Take 1 tablet by mouth daily. (Patient not taking: Reported on 12/23/2022)   escitalopram (LEXAPRO) 20 MG tablet Take 20 mg by mouth daily. (Patient not taking: Reported on 12/23/2022)   ezetimibe (ZETIA) 10 MG tablet Take 10 mg by mouth daily. (Patient not taking: Reported on 12/23/2022)   fluticasone (FLONASE) 50 MCG/ACT nasal spray Place 1 spray into both nostrils daily as needed for allergies or rhinitis. (Patient not taking: Reported on 12/23/2022)   methylcellulose (CITRUCEL) oral powder Take 1 packet by mouth daily. (Patient not taking: Reported on 12/23/2022)   Multiple Vitamin (MULTIVITAMIN WITH MINERALS) TABS tablet Take 1 tablet by mouth daily. (Patient not taking: Reported on 12/23/2022)   oxyCODONE-acetaminophen (PERCOCET/ROXICET) 5-325 MG tablet Take 1 tablet by mouth every 6 (six) hours as needed for severe pain. (Patient not taking: Reported on 12/23/2022)   pantoprazole (PROTONIX) 20 MG tablet Take 1 tablet (20 mg total) by mouth daily. (Patient not taking: Reported on 12/23/2022)   SYNTHROID 75 MCG tablet Take 75 mcg by mouth daily. (Patient not taking: Reported on 12/23/2022)   triamterene-hydrochlorothiazide (MAXZIDE) 75-50 MG per tablet Take 1 tablet by mouth daily. (Patient not taking: Reported on 12/23/2022)   TURMERIC PO Take 1 capsule by mouth daily. (Patient not taking: Reported on 12/23/2022)   vitamin C (ASCORBIC ACID) 500 MG tablet Take 500 mg by mouth daily.  (Patient not taking: Reported on 12/23/2022)   No facility-administered encounter medications on file as of 02/23/2023.     Objective:     PHYSICAL EXAMINATION:    VITALS:  Vitals:   02/23/23 0914  BP: 132/74  Pulse: 98  Resp: 18  SpO2: 100%  Height: 5\' 4"  (1.626 m)    GEN:  The patient appears stated age and is in NAD. HEENT:  Normocephalic, atraumatic.   Neurological examination:  General: NAD, well-groomed, appears stated age. Orientation: The patient is alert. Oriented to person, place and date.  Very anxious appearing, distracted and repetitive Cranial nerves: There is good facial symmetry.The speech is fluent and clear, edentulous. No aphasia or dysarthria. Fund of knowledge is appropriate. Recent and remote memory is impaired.  Attention and concentration are reduced.  Able to name objects and repeat phrases.  Hearing is intact to conversational tone .    Sensation: Sensation is intact to light touch throughout Motor: Strength is at least antigravity x4. DTR's 2/4 in UE/LE      06/23/2022   12:00 PM  Montreal Cognitive Assessment   Visuospatial/ Executive (0/5) 1  Naming (0/3) 1  Attention: Read list of digits (0/2) 1  Attention: Read list of letters (0/1) 1  Attention: Serial 7 subtraction starting at 100 (0/3) 3  Language: Repeat phrase (0/2) 1  Language : Fluency (0/1) 0  Abstraction (0/2) 1  Delayed Recall (0/5) 0  Orientation (0/6) 5  Total 14  Adjusted Score (based on education) 14        No data to display             Movement examination: Tone: There is normal tone in the UE/LE Abnormal movements:  no tremor.  No myoclonus.  No asterixis.   Coordination:  There is no decremation with RAM's. Normal finger to nose  Gait and Station: The patient has no difficulty arising out of a deep-seated chair without the use of the hands. The patient's stride length is good.  Gait is cautious and narrow.   Thank you for allowing [place] the opportunity to  participate in the care of this nice patient. Please do not hesitate to contact us for any questions or concerns.   Total time spent on today's visit was 28 minutes dedicated to this patient today, preparing to see patient, examining the patient, ordering tests and/or medications and counseling the patient, documenting clinical information in the EHR or other health record, independently interpreting results and communicating results to the patient/family, discussing treatment and goals, answering patient's questions and coordinating care.  Cc:  Fatima Sanger, FNP  Marlowe Kays 02/23/2023 11:36 AM

## 2023-02-23 NOTE — Patient Instructions (Addendum)
It was a pleasure to see you today at our office.   Recommendations:  Follow up in  6 months  Continue donepezil to 10 mg daily.  Continue your visit to the psychiatrist  Monitor driver    If you have any severe symptoms of a stroke, or other severe issues such as confusion,severe chills or fever, etc call 911 or go to the ER as you may need to be evaluated further For assessment of decision of mental capacity and competency:  Call Dr. Erick Blinks, geriatric psychiatrist at [phone] For psychiatric meds, mood meds: Please have your primary care physician manage these medications.       RECOMMENDATIONS FOR ALL PATIENTS WITH MEMORY PROBLEMS: 1. Continue to exercise (Recommend 30 minutes of walking everyday, or 3 hours every week) 2. Increase social interactions - continue going to [place] and enjoy social gatherings with friends and family 3. Eat healthy, avoid fried foods and eat more fruits and vegetables 4. Maintain adequate blood pressure, blood sugar, and blood cholesterol level. Reducing the risk of stroke and cardiovascular disease also helps promoting better memory. 5. Avoid stressful situations. Live a simple life and avoid aggravations. Organize your time and prepare for the next day in anticipation. 6. Sleep well, avoid any interruptions of sleep and avoid any distractions in the bedroom that may interfere with adequate sleep quality 7. Avoid sugar, avoid sweets as there is a strong link between excessive sugar intake, diabetes, and cognitive impairment We discussed the Mediterranean diet, which has been shown to help patients reduce the risk of progressive memory disorders and reduces cardiovascular risk. This includes eating fish, eat fruits and green leafy vegetables, nuts like almonds and hazelnuts, walnuts, and also use olive oil. Avoid fast foods and fried foods as much as possible. Avoid sweets and sugar as sugar use has been linked to worsening of memory  function.  There is always a concern of gradual progression of memory problems. If this is the case, then we may need to adjust level of care according to patient needs. Support, both to the patient and caregiver, should then be put into place.    FALL PRECAUTIONS: Be cautious when walking. Scan the area for obstacles that may increase the risk of trips and falls. When getting up in the mornings, sit up at the edge of the bed for a few minutes before getting out of bed. Consider elevating the bed at the head end to avoid drop of blood pressure when getting up. Walk always in a well-lit room (use night lights in the walls). Avoid area rugs or power cords from appliances in the middle of the walkways. Use a walker or a cane if necessary and consider physical therapy for balance exercise. Get your eyesight checked regularly.  FINANCIAL OVERSIGHT: Supervision, especially oversight when making financial decisions or transactions is also recommended.  HOME SAFETY: Consider the safety of the kitchen when operating appliances like stoves, microwave oven, and blender. Consider having supervision and share cooking responsibilities until no longer able to participate in those. Accidents with firearms and other hazards in the house should be identified and addressed as well.   ABILITY TO BE LEFT ALONE: If patient is unable to contact 911 operator, consider using [employer], or when the need is there, arrange for someone to stay with patients. Smoking is a fire hazard, consider supervision or cessation. Risk of wandering should be assessed by caregiver and if detected at any point, supervision and safe proof recommendations should be  instituted.  MEDICATION SUPERVISION: Inability to self-administer medication needs to be constantly addressed. Implement a mechanism to ensure safe administration of the medications.   DRIVING: Regarding driving, in patients with progressive memory problems, driving will be impaired.  We advise to have someone else do the driving if trouble finding directions or if minor accidents are reported. Independent driving assessment is available to determine safety of driving.   If you are interested in the driving assessment, you can contact the following:  [employer] in [place] [phone]  [facility] [phone]  [facility] [phone]  [facility] [phone] or [phone] Labs today [address] Imaging at [phone] for MRI

## 2023-02-24 ENCOUNTER — Ambulatory Visit
Admission: RE | Admit: 2023-02-24 | Discharge: 2023-02-24 | Disposition: A | Discharge status: Home / Self Care | Payer: Medicare HMO | Source: Ambulatory Visit | Attending: Registered Nurse | Admitting: Registered Nurse

## 2023-02-24 DIAGNOSIS — F332 Major depressive disorder, recurrent severe without psychotic features: Secondary | ICD-10-CM | POA: Diagnosis not present

## 2023-02-24 DIAGNOSIS — F418 Other specified anxiety disorders: Secondary | ICD-10-CM | POA: Diagnosis not present

## 2023-02-24 DIAGNOSIS — Z1231 Encounter for screening mammogram for malignant neoplasm of breast: Secondary | ICD-10-CM | POA: Diagnosis not present

## 2023-02-24 DIAGNOSIS — G301 Alzheimer's disease with late onset: Secondary | ICD-10-CM | POA: Diagnosis not present

## 2023-02-24 NOTE — Telephone Encounter (Addendum)
Daughter called to see if there any cancellations in relation to either of the patient's schedule procedures. Informed the daughter, none noted at this time. Patient has been placed on the cancellation list. Daughter also states the patient has abdominal pain and Is no longer using Tramadol for pain, but will speak to the patient's PCP. She also informed the nurse that she will be seeing if possible to make an appt with a psychiatrist for her mother, concerning the pain. Tried to [employer] the daughter and have her try not to worry until we know for certain that there is something to be worried about. Daughter understood.

## 2023-02-24 NOTE — Telephone Encounter (Signed)
Inbound call from patient daughter requesting to speak with a nurse in regards to patient being scheduled for hospital procedure. Please advise.   Thank you

## 2023-03-03 ENCOUNTER — Other Ambulatory Visit: Payer: Self-pay | Admitting: Family Medicine

## 2023-03-03 ENCOUNTER — Ambulatory Visit
Admission: RE | Admit: 2023-03-03 | Discharge: 2023-03-03 | Disposition: A | Discharge status: Home / Self Care | Payer: Medicare HMO | Source: Ambulatory Visit | Attending: Family Medicine | Admitting: Family Medicine

## 2023-03-03 DIAGNOSIS — R634 Abnormal weight loss: Secondary | ICD-10-CM

## 2023-03-06 NOTE — Telephone Encounter (Signed)
Inbound call from patient's daughter, states she was advised that mother needed two day prep. Instructions she was given only included one day prep. She is wanting to know which one she should follow.

## 2023-03-07 NOTE — Telephone Encounter (Signed)
Spoke with daughter on yesterday 03/06/23, to inform her, the instructions that were sent to the patient and/or the daughter was for the 2 day prep and not for 1 day. A 2 day prep is considered the prep being used for the day before the procedure and the day of the procedure. The daughter thought this was only for 1 day instead. With this explanation, the daughter was satisfied and agreeable. The EGD procedure is scheduled for 03/08/23.

## 2023-03-08 ENCOUNTER — Ambulatory Visit: Payer: Medicare HMO | Admitting: Internal Medicine

## 2023-03-08 ENCOUNTER — Emergency Department (HOSPITAL_COMMUNITY): Payer: Medicare HMO

## 2023-03-08 ENCOUNTER — Encounter: Payer: Self-pay | Admitting: Internal Medicine

## 2023-03-08 ENCOUNTER — Other Ambulatory Visit: Payer: Self-pay

## 2023-03-08 ENCOUNTER — Inpatient Hospital Stay (HOSPITAL_COMMUNITY): Payer: Medicare HMO

## 2023-03-08 ENCOUNTER — Inpatient Hospital Stay (HOSPITAL_COMMUNITY)
Admission: EM | Admit: 2023-03-08 | Discharge: 2023-04-05 | DRG: 004 | Disposition: A | Discharge status: Other Acute Inpatient Hospital | Payer: Medicare HMO | Attending: Pulmonary Disease | Admitting: Pulmonary Disease

## 2023-03-08 VITALS — Ht 64.0 in | Wt 122.0 lb

## 2023-03-08 DIAGNOSIS — T4275XA Adverse effect of unspecified antiepileptic and sedative-hypnotic drugs, initial encounter: Secondary | ICD-10-CM | POA: Diagnosis present

## 2023-03-08 DIAGNOSIS — Z79890 Hormone replacement therapy (postmenopausal): Secondary | ICD-10-CM

## 2023-03-08 DIAGNOSIS — R4182 Altered mental status, unspecified: Secondary | ICD-10-CM | POA: Diagnosis not present

## 2023-03-08 DIAGNOSIS — E039 Hypothyroidism, unspecified: Secondary | ICD-10-CM | POA: Diagnosis present

## 2023-03-08 DIAGNOSIS — J9692 Respiratory failure, unspecified with hypercapnia: Secondary | ICD-10-CM | POA: Insufficient documentation

## 2023-03-08 DIAGNOSIS — I10 Essential (primary) hypertension: Secondary | ICD-10-CM | POA: Diagnosis present

## 2023-03-08 DIAGNOSIS — J9602 Acute respiratory failure with hypercapnia: Secondary | ICD-10-CM | POA: Diagnosis not present

## 2023-03-08 DIAGNOSIS — J9601 Acute respiratory failure with hypoxia: Secondary | ICD-10-CM

## 2023-03-08 DIAGNOSIS — E874 Mixed disorder of acid-base balance: Secondary | ICD-10-CM | POA: Diagnosis present

## 2023-03-08 DIAGNOSIS — G928 Other toxic encephalopathy: Secondary | ICD-10-CM | POA: Diagnosis not present

## 2023-03-08 DIAGNOSIS — R0689 Other abnormalities of breathing: Secondary | ICD-10-CM | POA: Diagnosis not present

## 2023-03-08 DIAGNOSIS — R000 Tachycardia, unspecified: Secondary | ICD-10-CM | POA: Diagnosis not present

## 2023-03-08 DIAGNOSIS — R0902 Hypoxemia: Secondary | ICD-10-CM | POA: Diagnosis not present

## 2023-03-08 DIAGNOSIS — E539 Vitamin B deficiency, unspecified: Secondary | ICD-10-CM | POA: Insufficient documentation

## 2023-03-08 DIAGNOSIS — Z8249 Family history of ischemic heart disease and other diseases of the circulatory system: Secondary | ICD-10-CM

## 2023-03-08 DIAGNOSIS — E610 Copper deficiency: Secondary | ICD-10-CM | POA: Insufficient documentation

## 2023-03-08 DIAGNOSIS — J9621 Acute and chronic respiratory failure with hypoxia: Secondary | ICD-10-CM | POA: Diagnosis not present

## 2023-03-08 DIAGNOSIS — Z9911 Dependence on respirator [ventilator] status: Secondary | ICD-10-CM

## 2023-03-08 DIAGNOSIS — E54 Ascorbic acid deficiency: Secondary | ICD-10-CM | POA: Insufficient documentation

## 2023-03-08 DIAGNOSIS — Z681 Body mass index (BMI) 19 or less, adult: Secondary | ICD-10-CM | POA: Diagnosis not present

## 2023-03-08 DIAGNOSIS — I82402 Acute embolism and thrombosis of unspecified deep veins of left lower extremity: Secondary | ICD-10-CM | POA: Diagnosis present

## 2023-03-08 DIAGNOSIS — R9389 Abnormal findings on diagnostic imaging of other specified body structures: Secondary | ICD-10-CM

## 2023-03-08 DIAGNOSIS — I2694 Multiple subsegmental pulmonary emboli without acute cor pulmonale: Principal | ICD-10-CM

## 2023-03-08 DIAGNOSIS — M50222 Other cervical disc displacement at C5-C6 level: Secondary | ICD-10-CM | POA: Diagnosis not present

## 2023-03-08 DIAGNOSIS — F329 Major depressive disorder, single episode, unspecified: Secondary | ICD-10-CM | POA: Diagnosis present

## 2023-03-08 DIAGNOSIS — E785 Hyperlipidemia, unspecified: Secondary | ICD-10-CM | POA: Diagnosis not present

## 2023-03-08 DIAGNOSIS — K219 Gastro-esophageal reflux disease without esophagitis: Secondary | ICD-10-CM | POA: Diagnosis present

## 2023-03-08 DIAGNOSIS — R0603 Acute respiratory distress: Secondary | ICD-10-CM | POA: Diagnosis not present

## 2023-03-08 DIAGNOSIS — E876 Hypokalemia: Secondary | ICD-10-CM | POA: Diagnosis present

## 2023-03-08 DIAGNOSIS — I517 Cardiomegaly: Secondary | ICD-10-CM | POA: Diagnosis not present

## 2023-03-08 DIAGNOSIS — A419 Sepsis, unspecified organism: Secondary | ICD-10-CM | POA: Diagnosis not present

## 2023-03-08 DIAGNOSIS — R410 Disorientation, unspecified: Secondary | ICD-10-CM | POA: Diagnosis not present

## 2023-03-08 DIAGNOSIS — R569 Unspecified convulsions: Secondary | ICD-10-CM | POA: Diagnosis not present

## 2023-03-08 DIAGNOSIS — J15211 Pneumonia due to Methicillin susceptible Staphylococcus aureus: Secondary | ICD-10-CM | POA: Diagnosis not present

## 2023-03-08 DIAGNOSIS — R0602 Shortness of breath: Secondary | ICD-10-CM | POA: Diagnosis not present

## 2023-03-08 DIAGNOSIS — R9089 Other abnormal findings on diagnostic imaging of central nervous system: Secondary | ICD-10-CM | POA: Diagnosis not present

## 2023-03-08 DIAGNOSIS — G709 Myoneural disorder, unspecified: Secondary | ICD-10-CM | POA: Diagnosis present

## 2023-03-08 DIAGNOSIS — Z930 Tracheostomy status: Secondary | ICD-10-CM | POA: Diagnosis not present

## 2023-03-08 DIAGNOSIS — J9622 Acute and chronic respiratory failure with hypercapnia: Secondary | ICD-10-CM | POA: Diagnosis present

## 2023-03-08 DIAGNOSIS — E440 Moderate protein-calorie malnutrition: Secondary | ICD-10-CM | POA: Diagnosis not present

## 2023-03-08 DIAGNOSIS — Y95 Nosocomial condition: Secondary | ICD-10-CM | POA: Diagnosis present

## 2023-03-08 DIAGNOSIS — G9349 Other encephalopathy: Secondary | ICD-10-CM | POA: Diagnosis present

## 2023-03-08 DIAGNOSIS — J984 Other disorders of lung: Secondary | ICD-10-CM | POA: Diagnosis not present

## 2023-03-08 DIAGNOSIS — I6782 Cerebral ischemia: Secondary | ICD-10-CM | POA: Diagnosis not present

## 2023-03-08 DIAGNOSIS — R627 Adult failure to thrive: Secondary | ICD-10-CM | POA: Diagnosis not present

## 2023-03-08 DIAGNOSIS — Z888 Allergy status to other drugs, medicaments and biological substances status: Secondary | ICD-10-CM

## 2023-03-08 DIAGNOSIS — K6389 Other specified diseases of intestine: Secondary | ICD-10-CM | POA: Diagnosis not present

## 2023-03-08 DIAGNOSIS — I700 Atherosclerosis of aorta: Secondary | ICD-10-CM | POA: Diagnosis present

## 2023-03-08 DIAGNOSIS — F0393 Unspecified dementia, unspecified severity, with mood disturbance: Secondary | ICD-10-CM | POA: Diagnosis present

## 2023-03-08 DIAGNOSIS — R578 Other shock: Secondary | ICD-10-CM | POA: Diagnosis not present

## 2023-03-08 DIAGNOSIS — R918 Other nonspecific abnormal finding of lung field: Secondary | ICD-10-CM | POA: Diagnosis not present

## 2023-03-08 DIAGNOSIS — L8990 Pressure ulcer of unspecified site, unspecified stage: Secondary | ICD-10-CM | POA: Insufficient documentation

## 2023-03-08 DIAGNOSIS — J90 Pleural effusion, not elsewhere classified: Secondary | ICD-10-CM | POA: Diagnosis not present

## 2023-03-08 DIAGNOSIS — I952 Hypotension due to drugs: Secondary | ICD-10-CM | POA: Diagnosis present

## 2023-03-08 DIAGNOSIS — D649 Anemia, unspecified: Secondary | ICD-10-CM | POA: Diagnosis not present

## 2023-03-08 DIAGNOSIS — F0394 Unspecified dementia, unspecified severity, with anxiety: Secondary | ICD-10-CM | POA: Diagnosis present

## 2023-03-08 DIAGNOSIS — Z7901 Long term (current) use of anticoagulants: Secondary | ICD-10-CM

## 2023-03-08 DIAGNOSIS — R4189 Other symptoms and signs involving cognitive functions and awareness: Secondary | ICD-10-CM | POA: Diagnosis present

## 2023-03-08 DIAGNOSIS — M47812 Spondylosis without myelopathy or radiculopathy, cervical region: Secondary | ICD-10-CM | POA: Diagnosis not present

## 2023-03-08 DIAGNOSIS — R634 Abnormal weight loss: Secondary | ICD-10-CM | POA: Diagnosis not present

## 2023-03-08 DIAGNOSIS — G319 Degenerative disease of nervous system, unspecified: Secondary | ICD-10-CM

## 2023-03-08 DIAGNOSIS — G9341 Metabolic encephalopathy: Secondary | ICD-10-CM | POA: Diagnosis not present

## 2023-03-08 DIAGNOSIS — R1311 Dysphagia, oral phase: Secondary | ICD-10-CM | POA: Diagnosis not present

## 2023-03-08 DIAGNOSIS — E162 Hypoglycemia, unspecified: Secondary | ICD-10-CM | POA: Diagnosis not present

## 2023-03-08 DIAGNOSIS — J9600 Acute respiratory failure, unspecified whether with hypoxia or hypercapnia: Secondary | ICD-10-CM | POA: Diagnosis not present

## 2023-03-08 DIAGNOSIS — R404 Transient alteration of awareness: Principal | ICD-10-CM

## 2023-03-08 DIAGNOSIS — Z87891 Personal history of nicotine dependence: Secondary | ICD-10-CM

## 2023-03-08 DIAGNOSIS — I9581 Postprocedural hypotension: Secondary | ICD-10-CM | POA: Diagnosis present

## 2023-03-08 DIAGNOSIS — J9811 Atelectasis: Secondary | ICD-10-CM | POA: Diagnosis not present

## 2023-03-08 DIAGNOSIS — R509 Fever, unspecified: Secondary | ICD-10-CM | POA: Diagnosis present

## 2023-03-08 DIAGNOSIS — E870 Hyperosmolality and hypernatremia: Secondary | ICD-10-CM

## 2023-03-08 DIAGNOSIS — R400 Somnolence: Secondary | ICD-10-CM | POA: Diagnosis not present

## 2023-03-08 DIAGNOSIS — R531 Weakness: Secondary | ICD-10-CM | POA: Diagnosis not present

## 2023-03-08 DIAGNOSIS — R5381 Other malaise: Secondary | ICD-10-CM | POA: Diagnosis present

## 2023-03-08 DIAGNOSIS — Z9189 Other specified personal risk factors, not elsewhere classified: Secondary | ICD-10-CM

## 2023-03-08 DIAGNOSIS — G4700 Insomnia, unspecified: Secondary | ICD-10-CM | POA: Diagnosis present

## 2023-03-08 DIAGNOSIS — J189 Pneumonia, unspecified organism: Secondary | ICD-10-CM | POA: Diagnosis not present

## 2023-03-08 DIAGNOSIS — J95851 Ventilator associated pneumonia: Secondary | ICD-10-CM | POA: Diagnosis not present

## 2023-03-08 DIAGNOSIS — F411 Generalized anxiety disorder: Secondary | ICD-10-CM | POA: Diagnosis present

## 2023-03-08 DIAGNOSIS — H903 Sensorineural hearing loss, bilateral: Secondary | ICD-10-CM | POA: Diagnosis present

## 2023-03-08 DIAGNOSIS — Z452 Encounter for adjustment and management of vascular access device: Secondary | ICD-10-CM | POA: Diagnosis not present

## 2023-03-08 DIAGNOSIS — R0989 Other specified symptoms and signs involving the circulatory and respiratory systems: Secondary | ICD-10-CM | POA: Diagnosis not present

## 2023-03-08 DIAGNOSIS — I959 Hypotension, unspecified: Secondary | ICD-10-CM | POA: Diagnosis not present

## 2023-03-08 DIAGNOSIS — G9340 Encephalopathy, unspecified: Secondary | ICD-10-CM | POA: Diagnosis not present

## 2023-03-08 DIAGNOSIS — R609 Edema, unspecified: Secondary | ICD-10-CM | POA: Diagnosis not present

## 2023-03-08 DIAGNOSIS — E8770 Fluid overload, unspecified: Secondary | ICD-10-CM | POA: Diagnosis not present

## 2023-03-08 DIAGNOSIS — I2699 Other pulmonary embolism without acute cor pulmonale: Secondary | ICD-10-CM | POA: Diagnosis not present

## 2023-03-08 DIAGNOSIS — G8929 Other chronic pain: Secondary | ICD-10-CM | POA: Diagnosis present

## 2023-03-08 DIAGNOSIS — Z86718 Personal history of other venous thrombosis and embolism: Secondary | ICD-10-CM

## 2023-03-08 DIAGNOSIS — Z79899 Other long term (current) drug therapy: Secondary | ICD-10-CM

## 2023-03-08 DIAGNOSIS — Y848 Other medical procedures as the cause of abnormal reaction of the patient, or of later complication, without mention of misadventure at the time of the procedure: Secondary | ICD-10-CM | POA: Diagnosis not present

## 2023-03-08 DIAGNOSIS — Z781 Physical restraint status: Secondary | ICD-10-CM

## 2023-03-08 DIAGNOSIS — Z4682 Encounter for fitting and adjustment of non-vascular catheter: Secondary | ICD-10-CM | POA: Diagnosis not present

## 2023-03-08 DIAGNOSIS — D6489 Other specified anemias: Secondary | ICD-10-CM | POA: Diagnosis present

## 2023-03-08 DIAGNOSIS — G1229 Other motor neuron disease: Secondary | ICD-10-CM | POA: Diagnosis not present

## 2023-03-08 DIAGNOSIS — R4587 Impulsiveness: Secondary | ICD-10-CM | POA: Diagnosis not present

## 2023-03-08 DIAGNOSIS — R34 Anuria and oliguria: Secondary | ICD-10-CM | POA: Diagnosis present

## 2023-03-08 DIAGNOSIS — R739 Hyperglycemia, unspecified: Secondary | ICD-10-CM | POA: Diagnosis present

## 2023-03-08 DIAGNOSIS — N858 Other specified noninflammatory disorders of uterus: Secondary | ICD-10-CM | POA: Diagnosis not present

## 2023-03-08 LAB — BLOOD GAS, ARTERIAL
Acid-Base Excess: 13 mmol/L — ABNORMAL HIGH (ref 0.0–2.0)
Acid-Base Excess: 15.5 mmol/L — ABNORMAL HIGH (ref 0.0–2.0)
Bicarbonate: 37.6 mmol/L — ABNORMAL HIGH (ref 20.0–28.0)
Bicarbonate: 38.2 mmol/L — ABNORMAL HIGH (ref 20.0–28.0)
Drawn by: 42261
Drawn by: 422461
FIO2: 100 %
FIO2: 45 %
MECHVT: 440 mL
MECHVT: 440 mL
O2 Saturation: 100 %
O2 Saturation: 100 %
PEEP: 5 cmH2O
PEEP: 5 cmH2O
Patient temperature: 36.6
Patient temperature: 38.1
RATE: 20 {breaths}/min
RATE: 16 {breaths}/min
pCO2 arterial: 45 mm[Hg] (ref 32–48)
pCO2 arterial: 38 mm[Hg] (ref 32–48)
pH, Arterial: 7.53 — ABNORMAL HIGH (ref 7.35–7.45)
pH, Arterial: 7.61 (ref 7.35–7.45)
pO2, Arterial: 460 mm[Hg] — ABNORMAL HIGH (ref 83–108)
pO2, Arterial: 188 mm[Hg] — ABNORMAL HIGH (ref 83–108)

## 2023-03-08 LAB — PROTIME-INR
INR: 1.1 (ref 0.8–1.2)
Prothrombin Time: 13.9 s (ref 11.4–15.2)

## 2023-03-08 LAB — BLOOD GAS, VENOUS
Acid-Base Excess: 14.1 mmol/L — ABNORMAL HIGH (ref 0.0–2.0)
Acid-Base Excess: 12.8 mmol/L — ABNORMAL HIGH (ref 0.0–2.0)
Bicarbonate: 48.5 mmol/L — ABNORMAL HIGH (ref 20.0–28.0)
Bicarbonate: 47.4 mmol/L — ABNORMAL HIGH (ref 20.0–28.0)
O2 Saturation: 60.9 %
O2 Saturation: 58.4 %
Patient temperature: 37
Patient temperature: 37
pCO2, Ven: >123 mm[Hg] (ref 44–60)
pCO2, Ven: >123 mm[Hg] (ref 44–60)
pH, Ven: 7.17 — CL (ref 7.25–7.43)
pH, Ven: 7.15 — CL (ref 7.25–7.43)
pO2, Ven: 43 mm[Hg] (ref 32–45)
pO2, Ven: 37 mm[Hg] (ref 32–45)

## 2023-03-08 LAB — CBC WITH DIFFERENTIAL/PLATELET
Abs Immature Granulocytes: 0.03 K/uL (ref 0.00–0.07)
Basophils Absolute: 0 K/uL (ref 0.0–0.1)
Basophils Relative: 0 %
Eosinophils Absolute: 0 K/uL (ref 0.0–0.5)
Eosinophils Relative: 0 %
HCT: 45.1 % (ref 36.0–46.0)
Hemoglobin: 13.2 g/dL (ref 12.0–15.0)
Immature Granulocytes: 1 %
Lymphocytes Relative: 23 %
Lymphs Abs: 1.5 K/uL (ref 0.7–4.0)
MCH: 28.6 pg (ref 26.0–34.0)
MCHC: 29.3 g/dL — ABNORMAL LOW (ref 30.0–36.0)
MCV: 97.8 fL (ref 80.0–100.0)
Monocytes Absolute: 0.4 K/uL (ref 0.1–1.0)
Monocytes Relative: 6 %
Neutro Abs: 4.5 K/uL (ref 1.7–7.7)
Neutrophils Relative %: 70 %
Platelets: 237 K/uL (ref 150–400)
RBC: 4.61 MIL/uL (ref 3.87–5.11)
RDW: 13.2 % (ref 11.5–15.5)
WBC: 6.4 K/uL (ref 4.0–10.5)
nRBC: 0 % (ref 0.0–0.2)

## 2023-03-08 LAB — PROCALCITONIN: Procalcitonin: <0.1 ng/mL

## 2023-03-08 LAB — MAGNESIUM: Magnesium: 1.5 mg/dL — ABNORMAL LOW (ref 1.7–2.4)

## 2023-03-08 LAB — D-DIMER, QUANTITATIVE: D-Dimer, Quant: 2.88 ug{FEU}/mL — ABNORMAL HIGH (ref 0.00–0.50)

## 2023-03-08 LAB — COMPREHENSIVE METABOLIC PANEL
ALT: 21 U/L (ref 0–44)
AST: 27 U/L (ref 15–41)
Albumin: 4 g/dL (ref 3.5–5.0)
Alkaline Phosphatase: 44 U/L (ref 38–126)
Anion gap: 8 (ref 5–15)
BUN: 20 mg/dL (ref 8–23)
CO2: 42 mmol/L — ABNORMAL HIGH (ref 22–32)
Calcium: 9.7 mg/dL (ref 8.9–10.3)
Chloride: 103 mmol/L (ref 98–111)
Creatinine, Ser: 0.52 mg/dL (ref 0.44–1.00)
GFR, Estimated: >60 mL/min (ref >60)
Glucose, Bld: 129 mg/dL — ABNORMAL HIGH (ref 70–99)
Potassium: 3.5 mmol/L (ref 3.5–5.1)
Sodium: 153 mmol/L — ABNORMAL HIGH (ref 135–145)
Total Bilirubin: 0.5 mg/dL (ref <1.2)
Total Protein: 7.4 g/dL (ref 6.5–8.1)

## 2023-03-08 LAB — URINALYSIS, W/ REFLEX TO CULTURE (INFECTION SUSPECTED)
Bacteria, UA: NONE SEEN
Bilirubin Urine: NEGATIVE
Glucose, UA: NEGATIVE mg/dL
Ketones, ur: NEGATIVE mg/dL
Leukocytes,Ua: NEGATIVE
Nitrite: NEGATIVE
Protein, ur: 30 mg/dL — AB
Specific Gravity, Urine: 1.02 (ref 1.005–1.030)
pH: 5 (ref 5.0–8.0)

## 2023-03-08 LAB — PHOSPHORUS: Phosphorus: 1.4 mg/dL — ABNORMAL LOW (ref 2.5–4.6)

## 2023-03-08 LAB — I-STAT CG4 LACTIC ACID, ED
Lactic Acid, Venous: 0.7 mmol/L (ref 0.5–1.9)
Lactic Acid, Venous: 0.9 mmol/L (ref 0.5–1.9)
Lactic Acid, Venous: 2 mmol/L (ref 0.5–1.9)

## 2023-03-08 LAB — BRAIN NATRIURETIC PEPTIDE: B Natriuretic Peptide: 36 pg/mL (ref 0.0–100.0)

## 2023-03-08 LAB — TROPONIN I (HIGH SENSITIVITY): Troponin I (High Sensitivity): 32 ng/L — ABNORMAL HIGH (ref <18)

## 2023-03-08 LAB — APTT: aPTT: 27 s (ref 24–36)

## 2023-03-08 MED ORDER — FREE WATER
300.0000 mL | Status: DC
  Administered 2023-03-09 (×5): 300 mL

## 2023-03-08 MED ORDER — FENTANYL BOLUS VIA INFUSION
25.0000 ug | INTRAVENOUS | Status: DC | PRN
  Administered 2023-03-08: 25 ug via INTRAVENOUS
  Administered 2023-03-09: 50 ug via INTRAVENOUS
  Administered 2023-03-09 (×2): 75 ug via INTRAVENOUS
  Administered 2023-03-09: 100 ug via INTRAVENOUS
  Administered 2023-03-09 – 2023-03-10 (×3): 50 ug via INTRAVENOUS
  Administered 2023-03-10: 25 ug via INTRAVENOUS
  Administered 2023-03-10: 50 ug via INTRAVENOUS
  Administered 2023-03-10: 25 ug via INTRAVENOUS
  Administered 2023-03-10 – 2023-03-12 (×8): 50 ug via INTRAVENOUS

## 2023-03-08 MED ORDER — DOCUSATE SODIUM 100 MG PO CAPS
100.0000 mg | ORAL_CAPSULE | Freq: Two times a day (BID) | ORAL | Status: DC | PRN
  Administered 2023-03-11: 100 mg via ORAL
  Filled 2023-03-08: qty 1

## 2023-03-08 MED ORDER — FAMOTIDINE 20 MG PO TABS
20.0000 mg | ORAL_TABLET | Freq: Two times a day (BID) | ORAL | Status: DC
  Administered 2023-03-09 (×2): 20 mg
  Filled 2023-03-08 (×2): qty 1

## 2023-03-08 MED ORDER — PROPOFOL 1000 MG/100ML IV EMUL
0.0000 ug/kg/min | INTRAVENOUS | Status: DC
  Administered 2023-03-08: 5 ug/kg/min via INTRAVENOUS
  Administered 2023-03-09 (×2): 45 ug/kg/min via INTRAVENOUS
  Administered 2023-03-09 – 2023-03-10 (×2): 25 ug/kg/min via INTRAVENOUS
  Administered 2023-03-10 (×2): 35 ug/kg/min via INTRAVENOUS
  Administered 2023-03-11: 25 ug/kg/min via INTRAVENOUS
  Administered 2023-03-11 (×2): 35 ug/kg/min via INTRAVENOUS
  Administered 2023-03-11: 45 ug/kg/min via INTRAVENOUS
  Administered 2023-03-11 – 2023-03-12 (×2): 35 ug/kg/min via INTRAVENOUS
  Filled 2023-03-08 (×13): qty 100

## 2023-03-08 MED ORDER — FENTANYL CITRATE PF 50 MCG/ML IJ SOSY
PREFILLED_SYRINGE | INTRAMUSCULAR | Status: AC
  Filled 2023-03-08: qty 1

## 2023-03-08 MED ORDER — SUCCINYLCHOLINE CHLORIDE 200 MG/10ML IV SOSY
100.0000 mg | PREFILLED_SYRINGE | Freq: Once | INTRAVENOUS | Status: AC
  Administered 2023-03-08: 100 mg via INTRAVENOUS
  Filled 2023-03-08: qty 10

## 2023-03-08 MED ORDER — NOREPINEPHRINE 4 MG/250ML-% IV SOLN
INTRAVENOUS | Status: AC
  Filled 2023-03-08: qty 250

## 2023-03-08 MED ORDER — HEPARIN (PORCINE) 25000 UT/250ML-% IV SOLN
950.0000 [IU]/h | INTRAVENOUS | Status: DC
  Administered 2023-03-08 – 2023-03-09 (×2): 950 [IU]/h via INTRAVENOUS
  Filled 2023-03-08: qty 250

## 2023-03-08 MED ORDER — SODIUM CHLORIDE 0.9 % IV BOLUS
1000.0000 mL | Freq: Once | INTRAVENOUS | Status: AC
  Administered 2023-03-08: 1000 mL via INTRAVENOUS

## 2023-03-08 MED ORDER — FENTANYL CITRATE PF 50 MCG/ML IJ SOSY
50.0000 ug | PREFILLED_SYRINGE | Freq: Once | INTRAMUSCULAR | Status: AC
  Administered 2023-03-08: 50 ug via INTRAVENOUS

## 2023-03-08 MED ORDER — LACTATED RINGERS IV BOLUS
1000.0000 mL | Freq: Once | INTRAVENOUS | Status: AC
  Administered 2023-03-08: 1000 mL via INTRAVENOUS

## 2023-03-08 MED ORDER — FENTANYL CITRATE PF 50 MCG/ML IJ SOSY
25.0000 ug | PREFILLED_SYRINGE | Freq: Once | INTRAMUSCULAR | Status: AC
  Administered 2023-03-08: 25 ug via INTRAVENOUS
  Filled 2023-03-08: qty 1

## 2023-03-08 MED ORDER — HYDRALAZINE HCL 20 MG/ML IJ SOLN: 10.0000 mg | Freq: Four times a day (QID) | INTRAMUSCULAR | Status: DC | PRN

## 2023-03-08 MED ORDER — NOREPINEPHRINE 4 MG/250ML-% IV SOLN
0.0000 ug/min | INTRAVENOUS | Status: DC
  Administered 2023-03-08: 10 ug/min via INTRAVENOUS

## 2023-03-08 MED ORDER — NALOXONE HCL 0.4 MG/ML IJ SOLN
0.4000 mg | Freq: Once | INTRAMUSCULAR | Status: AC
  Administered 2023-03-08: 0.4 mg via INTRAVENOUS
  Filled 2023-03-08: qty 1

## 2023-03-08 MED ORDER — NALOXONE HCL 2 MG/2ML IJ SOSY
2.0000 mg | PREFILLED_SYRINGE | Freq: Once | INTRAMUSCULAR | Status: AC
  Administered 2023-03-08: 2 mg via INTRAVENOUS
  Filled 2023-03-08: qty 2

## 2023-03-08 MED ORDER — FENTANYL 2500MCG IN NS 250ML (10MCG/ML) PREMIX INFUSION
25.0000 ug/h | INTRAVENOUS | Status: DC
  Administered 2023-03-08: 25 ug/h via INTRAVENOUS
  Administered 2023-03-09: 75 ug/h via INTRAVENOUS
  Administered 2023-03-10: 25 ug/h via INTRAVENOUS
  Filled 2023-03-08 (×2): qty 250

## 2023-03-08 MED ORDER — POTASSIUM PHOSPHATES 15 MMOLE/5ML IV SOLN
30.0000 mmol | Freq: Once | INTRAVENOUS | Status: AC
  Administered 2023-03-09: 30 mmol via INTRAVENOUS
  Filled 2023-03-08: qty 10

## 2023-03-08 MED ORDER — SODIUM CHLORIDE 0.9 % IV SOLN: 500.0000 mL | Freq: Once | INTRAVENOUS | Status: DC

## 2023-03-08 MED ORDER — HEPARIN BOLUS VIA INFUSION
3500.0000 [IU] | Freq: Once | INTRAVENOUS | Status: AC
  Administered 2023-03-08: 3500 [IU] via INTRAVENOUS
  Filled 2023-03-08: qty 3500

## 2023-03-08 MED ORDER — IPRATROPIUM-ALBUTEROL 0.5-2.5 (3) MG/3ML IN SOLN
3.0000 mL | Freq: Four times a day (QID) | RESPIRATORY_TRACT | Status: DC
  Administered 2023-03-09 – 2023-03-11 (×10): 3 mL via RESPIRATORY_TRACT
  Filled 2023-03-08 (×10): qty 3

## 2023-03-08 MED ORDER — MAGNESIUM SULFATE 2 GM/50ML IV SOLN
2.0000 g | Freq: Once | INTRAVENOUS | Status: AC
  Administered 2023-03-08: 2 g via INTRAVENOUS
  Filled 2023-03-08: qty 50

## 2023-03-08 MED ORDER — ETOMIDATE 2 MG/ML IV SOLN
20.0000 mg | Freq: Once | INTRAVENOUS | Status: AC
  Administered 2023-03-08: 20 mg via INTRAVENOUS
  Filled 2023-03-08: qty 10

## 2023-03-08 MED ORDER — IPRATROPIUM-ALBUTEROL 0.5-2.5 (3) MG/3ML IN SOLN
3.0000 mL | Freq: Four times a day (QID) | RESPIRATORY_TRACT | Status: DC | PRN
  Filled 2023-03-08: qty 3

## 2023-03-08 MED ORDER — POLYETHYLENE GLYCOL 3350 17 G PO PACK
17.0000 g | PACK | Freq: Every day | ORAL | Status: DC | PRN
  Administered 2023-03-11: 17 g via ORAL
  Filled 2023-03-08: qty 1

## 2023-03-08 MED ORDER — IOHEXOL 350 MG/ML SOLN
80.0000 mL | Freq: Once | INTRAVENOUS | Status: AC | PRN
  Administered 2023-03-08: 80 mL via INTRAVENOUS

## 2023-03-08 NOTE — ED Provider Notes (Addendum)
Pt signed out by Dr. Adela Lank pending CT scans.  Scans reviewed by me.  I agree with the radiologist.  CT head:  No acute intracranial abnormality.   CXR:  No acute intrathoracic process.   VBG came back with pH low at 7.17 and pCO2 >123  Family wanted to hold off in intubation.  We will try bipap and see if there is any improvement.  UA neg for UTI  Repeat VBG after Bipap without any improvement.  I talked to the family again and they are now agreeing for intubation.  Pt was intubated without any problems.  She did have hypotension after intubation and required levophed.  We then had a difficult time getting her sedated after her CO2 was blown off.  She required propofol and fentanyl.    I did add on a CT chest as cause for hypoxia is unclear.  CT chest reviewed by me.  I agree with the radiologist.  CT chest:  Scattered subsegmental pulmonary emboli throughout the right lung.  No right heart strain is noted.    No other focal abnormality is noted.    Aortic Atherosclerosis (ICD10-I70.0).   PT started on heparin.  Pt d/w Dr. Francine Graven (CCM) for admission.  CRITICAL CARE Performed by: Jacalyn Lefevre   Total critical care time: 60 minutes  Critical care time was exclusive of separately billable procedures and treating other patients.  Critical care was necessary to treat or prevent imminent or life-threatening deterioration.  Critical care was time spent personally by me on the following activities: development of treatment plan with patient and/or surrogate as well as nursing, discussions with consultants, evaluation of patient's response to treatment, examination of patient, obtaining history from patient or surrogate, ordering and performing treatments and interventions, ordering and review of laboratory studies, ordering and review of radiographic studies, pulse oximetry and re-evaluation of patient's condition.     Jacalyn Lefevre, MD 03/08/23 2114    Jacalyn Lefevre, MD 03/08/23 2114

## 2023-03-08 NOTE — Progress Notes (Addendum)
PT setup on BIPAP per MD order. PT is on 10/5, rate 14, 40%.  Pt is tolerating well. BBS clear/diminished.  Pt is somnolent with minimal arousal.  HR 106, RR 24, sats 100%.  Family at bedside states pt has no respiratory history and doesn't take any resp meds at home.

## 2023-03-08 NOTE — ED Notes (Signed)
Attempt at intubation.  21 at lip Positive color change

## 2023-03-08 NOTE — Progress Notes (Signed)
Patient transferred to [facility] by EMS.

## 2023-03-08 NOTE — ED Notes (Signed)
Report given to Baruch Gouty, RN at [facility]. [facility] also called for transportation.

## 2023-03-08 NOTE — ED Notes (Addendum)
ED TO INPATIENT HANDOFF REPORT  ED Nurse Name and Phone #: Julian Reil [phone]  S Name/Age/Gender Karen [name] 72 y.o. female Room/Bed: RESB/RESB  Code Status   Code Status: Full Code  Home/SNF/Other Home A&Ox0 Is this baseline? No   Triage Complete: Triage complete  Chief Complaint Hypercapnic respiratory failure (HCC) [J96.92]  Triage Note Pt BIBA from [place]. Pt was going to get an endoscopy and staff found her satting at 70-80. Pt has increasing AMS. No speech and responsive to pain. Pt appeared to be guppy breathing on arrival.  100 on 3L [place]   Allergies Allergies  Allergen Reactions   Zoloft [Sertraline Hcl] Anxiety and Other (See Comments)    "crazy feeling"    Level of Care/Admitting Diagnosis ED Disposition     ED Disposition  Admit   Condition  --   Comment  Hospital Area: [facility] [[id number]]  Level of Care: ICU [6]  May admit patient to [facility] or [facility] if equivalent level of care is available:: Yes  Covid Evaluation: Asymptomatic - no recent exposure (last 10 days) testing not required  Diagnosis: Hypercapnic respiratory failure [facility]) [[id number]]  Admitting Physician: Martina Sinner [[id number]]  Attending Physician: Martina Sinner [[id number]]  Certification:: I certify this patient will need inpatient services for at least 2 midnights  Expected Medical Readiness: 03/10/2023          B Medical/Surgery History Past Medical History:  Diagnosis Date   Anemia    remote   Arthritis    hips and knee   Bilateral sensorineural hearing loss 03/26/2019   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 Three variants of uncertain significance were detected - one in the AXIN2 gene called c.1829G>A, one in the PMS2 gene called c.964G>A, and one in the RNF43 gene called c.1114C>T. The report date is 06/24/2019.     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   Tick bite 08/2015   UTI (urinary tract infection) 10/2015   Past Surgical History:  Procedure Laterality Date   CESAREAN SECTION     X 4   COLONOSCOPY     COLONOSCOPY WITH PROPOFOL N/A 11/17/2015   Procedure: COLONOSCOPY WITH PROPOFOL;  Surgeon: Charolett Bumpers, MD;  Location: [facility] ENDOSCOPY;  Service: Endoscopy;  Laterality: N/A;   POLYPECTOMY       A IV Location/Drains/Wounds Patient Lines/Drains/Airways Status     Active Line/Drains/Airways     Name Placement date Placement time Site Days   Peripheral IV 03/08/23 20 G Left Antecubital 03/08/23  1242  Antecubital  less than 1   Peripheral IV 03/08/23 20 G Anterior;Proximal;Right Forearm 03/08/23  1959  Forearm  less than 1   Peripheral IV 03/08/23 22 G Left;Posterior Hand 03/08/23  2006  Hand  less than 1   Urethral Catheter Marline Backbone, RN Temperature probe 16 Fr. 03/08/23  2014  Temperature probe  less than 1   Airway 7.5 mm 03/08/23  1923  -- less than 1            Intake/Output Last 24 hours No intake or output data in the 24 hours ending 03/08/23 2205  Labs/Imaging Results for orders placed or performed during the hospital encounter of 03/08/23 (from the past 48 hour(s))  CBC with Differential     Status: Abnormal   Collection Time: 03/08/23 12:42 PM  Result Value Ref Range   WBC 6.4 4.0 - 10.5 K/uL   RBC 4.61 3.87 -  5.11 MIL/uL   Hemoglobin 13.2 12.0 - 15.0 g/dL   HCT 29.5 28.4 - 13.2 %   MCV 97.8 80.0 - 100.0 fL   MCH 28.6 26.0 - 34.0 pg   MCHC 29.3 (L) 30.0 - 36.0 g/dL   RDW 44.0 10.2 - 72.5 %   Platelets 237 150 - 400 K/uL   nRBC 0.0 0.0 - 0.2 %   Neutrophils Relative % 70 %   Neutro Abs 4.5 1.7 - 7.7 K/uL   Lymphocytes Relative 23 %   Lymphs Abs 1.5 0.7 - 4.0 K/uL   Monocytes Relative 6 %   Monocytes Absolute 0.4 0.1 - 1.0 K/uL   Eosinophils Relative 0 %   Eosinophils Absolute 0.0 0.0 - 0.5 K/uL   Basophils Relative 0 %   Basophils Absolute 0.0 0.0 - 0.1 K/uL   Immature Granulocytes 1 %   Abs Immature Granulocytes 0.03 0.00 - 0.07 K/uL    Comment: Performed at [facility], [address], [place], [place] [address]  Protime-INR     Status: None   Collection Time: 03/08/23 12:42 PM  Result Value Ref Range   Prothrombin Time 13.9 11.4 - 15.2 seconds   INR 1.1 0.8 - 1.2    Comment: (NOTE) INR goal varies based on device and disease states. Performed at [facility], [address] [place], [place] [address]   APTT     Status: None   Collection Time: 03/08/23 12:42 PM  Result Value Ref Range   aPTT 27 24 - 36 seconds    Comment: Performed at [facility], [address] [place], [place] [address]  Comprehensive metabolic panel     Status: Abnormal   Collection Time: 03/08/23  2:30 PM  Result Value Ref Range   Sodium 153 (H) 135 - 145 mmol/L   Potassium 3.5 3.5 - 5.1 mmol/L   Chloride 103 98 - 111 mmol/L   CO2 42 (H) 22 - 32 mmol/L   Glucose, Bld 129 (H) 70 - 99 mg/dL    Comment: Glucose reference range applies only to samples taken after fasting for at least 8 hours.   BUN 20 8 - 23 mg/dL   Creatinine, Ser 3.87 0.44 - 1.00 mg/dL   Calcium 9.7 8.9 - 56.4 mg/dL   Total Protein 7.4 6.5 - 8.1 g/dL   Albumin 4.0 3.5 - 5.0 g/dL   AST 27 15 - 41 U/L   ALT 21 0 - 44 U/L   Alkaline Phosphatase 44 38 - 126 U/L   Total  Bilirubin 0.5 <1.2 mg/dL   GFR, Estimated >33 >29 mL/min    Comment: (NOTE) Calculated using the CKD-EPI Creatinine Equation (2021)    Anion gap 8 5 - 15    Comment: Performed at  [facility], [address], [place], [place] [address]  I-Stat Lactic Acid, ED     Status: None   Collection Time: 03/08/23  2:45 PM  Result Value Ref Range   Lactic Acid, Venous 0.7 0.5 - 1.9 mmol/L  Blood gas, venous     Status: Abnormal   Collection Time: 03/08/23  4:40 PM  Result Value Ref Range   pH, Ven 7.17 (LL) 7.25 - 7.43    Comment: CRITICAL RESULT CALLED TO, READ BACK BY AND VERIFIED WITH: SClelia Croft, RN 03/08/23 1704 J. TUCKER    pCO2, Ven >123 (HH) 44 - 60 mmHg    Comment: CRITICAL RESULT CALLED TO, READ BACK BY AND VERIFIED WITH: SClelia Croft, RN 03/08/23 1704 J. TUCKER    pO2, Ven 43 32 - 45 mmHg   Bicarbonate 48.5 (H) 20.0 - 28.0 mmol/L   Acid-Base Excess 14.1 (H) 0.0 - 2.0 mmol/L   O2 Saturation 60.9 %   Patient temperature 37.0     Comment: Performed at [facility], [address], [place], [place] [address]  Urinalysis, w/ Reflex to Culture (Infection Suspected) -Urine, Catheterized     Status: Abnormal   Collection Time: 03/08/23  5:20 PM  Result Value Ref Range   Specimen Source URINE, CATHETERIZED    Color, Urine YELLOW YELLOW   APPearance CLEAR CLEAR   Specific Gravity, Urine 1.020 1.005 - 1.030   pH 5.0 5.0 - 8.0   Glucose, UA NEGATIVE NEGATIVE mg/dL   Hgb urine dipstick MODERATE (A) NEGATIVE   Bilirubin Urine NEGATIVE NEGATIVE   Ketones, ur NEGATIVE NEGATIVE mg/dL   Protein, ur 30 (A) NEGATIVE mg/dL   Nitrite NEGATIVE NEGATIVE   Leukocytes,Ua NEGATIVE NEGATIVE   RBC / HPF 0-5 0 - 5 RBC/hpf   WBC, UA 0-5 0 - 5 WBC/hpf    Comment:        Reflex urine culture not performed if WBC <=10, OR if Squamous epithelial cells >5. If Squamous epithelial cells >5 suggest recollection.    Bacteria, UA NONE SEEN NONE SEEN   Squamous Epithelial /  HPF 0-5 0 - 5 /HPF   Mucus PRESENT    Hyaline Casts, UA PRESENT     Comment: Performed at [facility], [address], [place], [place] [address]  Blood gas, venous (at [facility])     Status: Abnormal   Collection Time: 03/08/23  6:47 PM  Result Value Ref Range   pH, Ven 7.15 (LL) 7.25 - 7.43    Comment: CRITICAL RESULT CALLED TO, READ BACK BY AND VERIFIED WITH: Jasper Loser, RN 03/08/23 1903 J. COLE    pCO2, Ven >123 (HH) 44 - 60 mmHg    Comment: CRITICAL RESULT CALLED TO, READ BACK BY AND VERIFIED WITH: Jasper Loser, RN 03/08/23 1903 J. COLE    pO2, Ven 37 32 - 45 mmHg   Bicarbonate 47.4 (H) 20.0 - 28.0 mmol/L   Acid-Base Excess 12.8 (H) 0.0 - 2.0 mmol/L   O2 Saturation 58.4 %   Patient temperature 37.0     Comment: Performed at [facility], [address] [place], [place] [address]  I-Stat Lactic Acid, ED     Status: None   Collection Time: 03/08/23  6:58 PM  Result Value Ref Range   Lactic Acid, Venous 0.9 0.5 - 1.9 mmol/L  Blood gas, arterial     Status: Abnormal   Collection Time: 03/08/23  8:31 PM  Result Value  Ref Range   FIO2 100.00 %   Delivery systems VENTILATOR    Mode PRESSURE REGULATED VOLUME CONTROL    MECHVT 440 mL   RATE 20 resp/min   PEEP 5.0 cm H20   pH, Arterial 7.53 (H) 7.35 - 7.45   pCO2 arterial 45 32 - 48 mmHg   pO2, Arterial 460 (H) 83 - 108 mmHg   Bicarbonate 37.6 (H) 20.0 - 28.0 mmol/L   Acid-Base Excess 13.0 (H) 0.0 - 2.0 mmol/L   O2 Saturation 100 %   Patient temperature 36.6    Collection site RIGHT RADIAL    Drawn by [id number]    Allens test (pass/fail) PASS PASS    Comment: Performed at [facility], [address] [place], [place] [address]  I-Stat CG4 Lactic Acid     Status: Abnormal   Collection Time: 03/08/23  9:51 PM  Result Value Ref Range   Lactic Acid, Venous 2.0 (HH) 0.5 - 1.9 mmol/L   Comment NOTIFIED PHYSICIAN    DG Chest Portable 1 View  Result Date: 03/08/2023 CLINICAL  DATA:  Intubation. EXAM: PORTABLE CHEST 1 VIEW COMPARISON:  03/08/2023. FINDINGS: The heart size and mediastinal contours are within normal limits. There is atherosclerotic calcification of the aorta. No consolidation, effusion, or pneumothorax. An endotracheal tube terminates 7 mm above the carina. An enteric tube terminates in the stomach. No acute osseous abnormality. IMPRESSION: 1. No active disease. 2. Support apparatus as described above. Electronically Signed   By: Thornell Sartorius M.D.   On: 03/08/2023 21:13   CT Angio Chest PE W and/or Wo Contrast  Result Date: 03/08/2023 CLINICAL DATA:  Hypoxia and shortness of breath, initial encounter EXAM: CT ANGIOGRAPHY CHEST WITH CONTRAST TECHNIQUE: Multidetector CT imaging of the chest was performed using the standard protocol during bolus administration of intravenous contrast. Multiplanar CT image reconstructions and MIPs were obtained to evaluate the vascular anatomy. RADIATION DOSE REDUCTION: This exam was performed according to the departmental dose-optimization program which includes automated exposure control, adjustment of the mA and/or kV according to patient size and/or use of iterative reconstruction technique. CONTRAST:  80mL OMNIPAQUE IOHEXOL 350 MG/ML SOLN COMPARISON:  Chest x-ray from earlier in the same day. FINDINGS: Cardiovascular: Atherosclerotic calcifications of the thoracic aorta are noted without aneurysmal dilatation or dissection. Pulmonary artery shows a normal branching pattern bilaterally. Multiple small filling defects are noted in the subsegmental branches of the right pulmonary artery. No definitive filling defects are noted on the left. No right heart strain is seen. No significant coronary calcifications are noted. Mediastinum/Nodes: Thoracic inlet is within normal limits. No hilar or mediastinal adenopathy is noted. The endotracheal tube and gastric catheter are noted in satisfactory position. Lungs/Pleura: Lungs are well aerated  bilaterally. No focal infiltrate or effusion is seen. No focal nodules are noted. Upper Abdomen: No acute abnormality. Musculoskeletal: Mild degenerative changes in the thoracic spine are noted. No acute rib abnormality is noted. Review of the MIP images confirms the above findings. IMPRESSION: Scattered subsegmental pulmonary emboli throughout the right lung. No right heart strain is noted. No other focal abnormality is noted. Aortic Atherosclerosis (ICD10-I70.0). Critical Value/emergent results were called by telephone at the time of interpretation on 03/08/2023 at 9:02 pm to Dr. Jacalyn Lefevre , who verbally acknowledged these results. Electronically Signed   By: Alcide Clever M.D.   On: 03/08/2023 21:03   CT Head Wo Contrast  Result Date: 03/08/2023 CLINICAL DATA:  Delirium EXAM: CT HEAD WITHOUT CONTRAST TECHNIQUE: Contiguous axial  images were obtained from the base of the skull through the vertex without intravenous contrast. RADIATION DOSE REDUCTION: This exam was performed according to the departmental dose-optimization program which includes automated exposure control, adjustment of the mA and/or kV according to patient size and/or use of iterative reconstruction technique. COMPARISON:  Brain MR 07/05/22 FINDINGS: Brain: No hemorrhage. No hydrocephalus. No extra-axial fluid collection. No CT evidence of an acute cortical infarct. No mass effect. No mass lesion. Vascular: No hyperdense vessel or unexpected calcification. Skull: Normal. Negative for fracture or focal lesion. Sinuses/Orbits: No middle ear or mastoid effusion. Paranasal sinuses are clear. Orbits are unremarkable. Other: None. IMPRESSION: No acute intracranial abnormality. Electronically Signed   By: Lorenza Cambridge M.D.   On: 03/08/2023 16:54   DG Chest Port 1 View  Result Date: 03/08/2023 CLINICAL DATA:  Altered level of consciousness, hypoxia, sepsis EXAM: PORTABLE CHEST 1 VIEW COMPARISON:  03/03/2023 FINDINGS: Single frontal view of the  chest demonstrates an unremarkable cardiac silhouette. No airspace disease, effusion, or pneumothorax. No acute bony abnormalities. IMPRESSION: 1. No acute intrathoracic process. Electronically Signed   By: Sharlet Salina M.D.   On: 03/08/2023 16:43    Pending Labs Unresulted Labs (From admission, onward)     Start     Ordered   03/09/23 0500  Triglycerides  (propofol (DIPRIVAN))  Every 72 hours,   R (with URGENT occurrences)     Comments: While on propofol (DIPRIVAN)    03/08/23 1908   03/09/23 0500  Heparin level (unfractionated)  Tomorrow morning,   R        03/08/23 2125   03/09/23 0500  CBC  Daily,   R      03/08/23 2125   03/09/23 0500  Basic metabolic panel  Tomorrow morning,   R        03/08/23 2129   03/09/23 0500  Magnesium  Tomorrow morning,   R        03/08/23 2129   03/09/23 0500  Phosphorus  Tomorrow morning,   R        03/08/23 2129   03/08/23 2230  Blood gas, arterial  Once,   R        03/08/23 2146   03/08/23 2130  Urinalysis, w/ Reflex to Culture (Infection Suspected) -Urine, Catheterized  (Urine Culture)  Once,   R       Question:  Specimen Source  Answer:  Urine, Catheterized   03/08/23 2129   03/08/23 2129  Magnesium  Once,   R        03/08/23 2129   03/08/23 2129  Phosphorus  Once,   R        03/08/23 2129   03/08/23 2129  Procalcitonin  Once,   R       References:    Procalcitonin Lower Respiratory Tract Infection AND Sepsis Procalcitonin Algorithm   03/08/23 2129   03/08/23 2129  Brain natriuretic peptide  Once,   R        03/08/23 2129   03/08/23 2129  Type and screen If need to transfuse blood products please use the blood administration order set  Once,   R       Comments: If need to transfuse blood products please use the blood administration order set    03/08/23 2129   03/08/23 2129  Culture, blood (Routine X 2) w Reflex to ID Panel  BLOOD CULTURE X 2,   R (with TIMED occurrences)      03/08/23  2129   03/08/23 2031  Rapid urine drug screen  (hospital performed)  ONCE - STAT,   STAT        03/08/23 2030   03/08/23 2029  RPR  Once,   URGENT        03/08/23 2028   03/08/23 2029  HIV Antibody (routine testing w rflx)  (HIV Antibody (Routine testing w reflex) panel)  Once,   URGENT        03/08/23 2028   03/08/23 2028  TSH  Once,   URGENT        03/08/23 2027   03/08/23 2028  Vitamin B12  Once,   URGENT        03/08/23 2027   03/08/23 2028  Ammonia  Once,   STAT        03/08/23 2028   03/08/23 1947  D-dimer, quantitative  Once,   STAT        03/08/23 1946   03/08/23 1305  Blood Culture (routine x 2)  (Undifferentiated presentation (screening labs and basic nursing orders))  BLOOD CULTURE X 2,   STAT      03/08/23 1305            Vitals/Pain Today's Vitals   03/08/23 2000 03/08/23 2030 03/08/23 2100 03/08/23 2123  BP: (!) 117/99 122/75    Pulse: (!) 112     Resp: 19 20    Temp:  98.5 F (36.9 C)    TempSrc:      SpO2: (!) 82%   100%  Weight:   55.3 kg   Height:   5\' 4"  (1.626 m)   PainSc:        Isolation Precautions No active isolations  Medications Medications  propofol (DIPRIVAN) 1000 MG/100ML infusion (45 mcg/kg/min  55.3 kg Intravenous Rate/Dose Change 03/08/23 2040)  fentaNYL in NS (40mcg/ml) infusion-PREMIX (50 mcg/hr Intravenous Rate/Dose Change 03/08/23 2157)  fentaNYL (SUBLIMAZE) bolus via infusion 25-100 mcg (has no administration in time range)  free water 300 mL (has no administration in time range)  heparin ADULT infusion 100 units/mL (25000 units/236mL) (950 Units/hr Intravenous New Bag/Given 03/08/23 2154)  docusate sodium (COLACE) capsule 100 mg (has no administration in time range)  polyethylene glycol (MIRALAX / GLYCOLAX) packet 17 g (has no administration in time range)  famotidine (PEPCID) tablet 20 mg (has no administration in time range)  ipratropium-albuterol (DUONEB) 0.5-2.5 (3) MG/3ML nebulizer solution 3 mL (has no administration in time range)   ipratropium-albuterol (DUONEB) 0.5-2.5 (3) MG/3ML nebulizer solution 3 mL (has no administration in time range)  hydrALAZINE (APRESOLINE) injection 10 mg (has no administration in time range)  naloxone [facility]) injection 0.4 mg (0.4 mg Intravenous Given 03/08/23 1324)  sodium chloride 0.9 % bolus 1,000 mL (0 mLs Intravenous Stopped 03/08/23 1623)  naloxone (NARCAN) injection 2 mg (2 mg Intravenous Given 03/08/23 1520)  lactated ringers bolus 1,000 mL (1,000 mLs Intravenous New Bag/Given 03/08/23 1620)  etomidate (AMIDATE) injection 20 mg (20 mg Intravenous Given 03/08/23 1921)  succinylcholine (ANECTINE) syringe 100 mg (100 mg Intravenous Given 03/08/23 1922)  fentaNYL (SUBLIMAZE) injection 25 mcg (25 mcg Intravenous Given 03/08/23 1942)  fentaNYL (SUBLIMAZE) injection 50 mcg (50 mcg Intravenous Given 03/08/23 1959)  iohexol (OMNIPAQUE) 350 MG/ML injection 80 mL (80 mLs Intravenous Contrast Given 03/08/23 2042)  heparin bolus via infusion 3,500 Units (3,500 Units Intravenous Bolus from Bag 03/08/23 2154)    Mobility non-ambulatory     Focused Assessments    R Recommendations: See Admitting  Provider Note  Report given to:   Additional Notes:

## 2023-03-08 NOTE — ED Notes (Signed)
RN stuck pt in right hand for blood culture set. Unable to get second set of blood cultures due to medications already being ran in current IV's.

## 2023-03-08 NOTE — ED Provider Notes (Addendum)
  Physical Exam  BP 97/68   Pulse (!) 144   Temp 97.9 F (36.6 C) (Axillary)   Resp 20   SpO2 99%   Physical Exam  Procedures  Procedure Name: Intubation Date/Time: 03/08/2023 7:47 PM  Performed by: Jacalyn Lefevre, MDPre-anesthesia Checklist: Patient identified, Patient being monitored, Emergency Drugs available, Timeout performed and Suction available Oxygen Delivery Method: Ambu bag Preoxygenation: Pre-oxygenation with 100% oxygen Induction Type: Rapid sequence Ventilation: Mask ventilation without difficulty Laryngoscope Size: Glidescope and 3 Tube size: 7.5 mm Number of attempts: 1 Placement Confirmation: ETT inserted through vocal cords under direct vision, Positive ETCO2 and Breath sounds checked- equal and bilateral Secured at: 21 cm Tube secured with: ETT holder Dental Injury: Teeth and Oropharynx as per pre-operative assessment       ED Course / MDM    Medical Decision Making Amount and/or Complexity of Data Reviewed Labs: ordered. Radiology: ordered. ECG/medicine tests: ordered.  Risk Prescription drug management. Decision regarding hospitalization.          Jacalyn Lefevre, MD 03/08/23 Karen [name]    Jacalyn Lefevre, MD 03/08/23 2114

## 2023-03-08 NOTE — Progress Notes (Signed)
PHARMACY - ANTICOAGULATION CONSULT NOTE  Pharmacy Consult for heparin Indication: pulmonary embolus  Allergies  Allergen Reactions   Zoloft [Sertraline Hcl] Anxiety and Other (See Comments)    "crazy feeling"    Patient Measurements: Height: 5\' 4"  (162.6 cm) Weight: 55.3 kg (122 lb) IBW/kg (Calculated) : 54.7 Heparin Dosing Weight: 55.3 kg  Vital Signs: Temp: 97.9 F (36.6 C) (11/20 1904) Temp Source: Axillary (11/20 1904) BP: 117/99 (11/20 2000) Pulse Rate: 112 (11/20 2000)  Labs: Recent Labs    03/08/23 1242 03/08/23 1430  HGB 13.2  --   HCT 45.1  --   PLT 237  --   APTT 27  --   LABPROT 13.9  --   INR 1.1  --   CREATININE  --  0.52    Estimated Creatinine Clearance: 56.5 mL/min (by C-G formula based on SCr of 0.52 mg/dL).   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 Three variants of uncertain significance were detected - one in the AXIN2 gene called c.1829G>A, one in the PMS2 gene called c.964G>A, and one in the RNF43 gene called c.1114C>T. The report date is 06/24/2019.     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 She was transferred to the [facility] ED where CTA PE revealed scattered subsegmental pulmonary emboli throughout the right lung, no RHS. She was intubated in the ED due to lack of improvement on BiPAP. No anticoagulation noted PTA. Pharmacy consulted for heparin dosing.   Today, 03/08/23: Hgb 13.2, plts 237--stable Scr 0.52, CrCl ~56 ml/min No s/sx of bleeding reported  Goal of Therapy:  Heparin level 0.3-0.7 units/ml Monitor platelets by anticoagulation protocol: Yes   Plan:  Give IV heparin 3500 units x1 Start heparin gtt @950  units/hr  Check heparin level in 8hrs Monitor heparin level, CBC, and s/sx of bleeding daily   Cherylin Mylar, PharmD Clinical Pharmacist  11/20/20249:22 PM

## 2023-03-08 NOTE — ED Triage Notes (Signed)
Pt BIBA from [place]. Pt was going to get an endoscopy and staff found her satting at 70-80. Pt has increasing AMS. No speech and responsive to pain. Pt appeared to be guppy breathing on arrival.  100 on 3L [place]

## 2023-03-08 NOTE — Progress Notes (Signed)
Pt arrived for procedure.  Unable to obtain O2 sat initially.  After warming up hands, pt is maintaining sats in the 80s.  Pt appears very drowsy.  Unable to answer questions without assistance of daughter.  Made M. Noles CRNA aware.

## 2023-03-08 NOTE — Progress Notes (Signed)
Patient transported to CT scan and back to RESB while on ventilator.  Patient remained stable and comfortable for the duration of trip.

## 2023-03-08 NOTE — ED Provider Notes (Signed)
[place] EMERGENCY DEPARTMENT AT [facility] Provider Note   CSN: [id number] Arrival date & time: 03/08/23  1219     History  Chief Complaint  Patient presents with   Altered Mental Status    Karen [name] is a 72 y.o. female.  72 yo F with a chief complaint of altered mental status.  The patient went to have endoscopy performed today and she was too somnolent prior to initiation to get started.  They then sent her here for evaluation.  Limited history from EMS, was in her normal state of health had trouble sleeping last night due to the bowel prep.  No new medications.  No cough congestion or fever.  Patient is too sleepy to answer questions.  Level 5 caveat.   Altered Mental Status      Home Medications Prior to Admission medications   Medication Sig Start Date End Date Taking? Authorizing Provider  acetaminophen (TYLENOL) 500 MG tablet Take 500 mg by mouth every 6 (six) hours as needed (pain).    [provider]  atenolol (TENORMIN) 50 MG tablet Take 50 mg by mouth daily. for blood pressure 11/03/14   [provider]  cholecalciferol (VITAMIN D) 1000 UNITS tablet Take 1,000 Units by mouth daily. Patient not taking: Reported on 12/23/2022    [provider]  donepezil (ARICEPT) 10 MG tablet Take 1 tablet (10 mg total) by mouth daily. 02/23/23   Marcos Eke, PA-C  ELDERBERRY PO Take 1 tablet by mouth daily. Patient not taking: Reported on 12/23/2022    [provider]  escitalopram (LEXAPRO) 20 MG tablet Take 20 mg by mouth daily. Patient not taking: Reported on 12/23/2022    [provider]  ezetimibe (ZETIA) 10 MG tablet Take 10 mg by mouth daily. Patient not taking: Reported on 12/23/2022 06/10/19   [provider]  fluticasone (FLONASE) 50 MCG/ACT nasal spray Place 1 spray into both nostrils daily as needed for allergies or rhinitis. Patient not taking: Reported on 12/23/2022    [provider]   LORazepam (ATIVAN) 2 MG tablet Take 1 tablet (2 mg total) by mouth as directed. Take 20-30 minutes before MRI procedure Patient not taking: Reported on 03/08/2023 01/05/23   Unk Lightning, PA  methylcellulose (CITRUCEL) oral powder Take 1 packet by mouth daily. Patient not taking: Reported on 12/23/2022 02/12/21   Rachael Fee, MD  Multiple Vitamin (MULTIVITAMIN WITH MINERALS) TABS tablet Take 1 tablet by mouth daily. Patient not taking: Reported on 12/23/2022    [provider]  oxyCODONE-acetaminophen (PERCOCET/ROXICET) 5-325 MG tablet Take 1 tablet by mouth every 6 (six) hours as needed for severe pain. Patient not taking: Reported on 12/23/2022 11/11/22   Henderly, Britni A, PA-C  pantoprazole (PROTONIX) 20 MG tablet Take 1 tablet (20 mg total) by mouth daily. Patient not taking: Reported on 12/23/2022 11/11/22   Henderly, Britni A, PA-C  SYNTHROID 75 MCG tablet Take 75 mcg by mouth daily. Patient not taking: Reported on 12/23/2022 01/22/20   [provider]  traMADol (ULTRAM) 50 MG tablet Take 1-2 tablets (50-100 mg total) by mouth every 6 (six) hours as needed. Up to twice daily as needed for pain Patient not taking: Reported on 03/08/2023 08/10/16   Tobey Grim, MD  triamterene-hydrochlorothiazide (MAXZIDE) 75-50 MG per tablet Take 1 tablet by mouth daily. Patient not taking: Reported on 12/23/2022 10/27/14   [provider]  TURMERIC PO Take 1 capsule by mouth daily. Patient not  taking: Reported on 12/23/2022    [provider]  vitamin C (ASCORBIC ACID) 500 MG tablet Take 500 mg by mouth daily. Patient not taking: Reported on 12/23/2022    [provider]      Allergies    Zoloft [sertraline hcl]    Review of Systems   Review of Systems  Physical Exam Updated Vital Signs BP (!) 172/93   Pulse 100   Temp 98.4 F (36.9 C)   Resp 19   SpO2 100%  Physical Exam Vitals and nursing note reviewed.  Constitutional:      General: She  is not in acute distress.    Appearance: She is well-developed. She is not diaphoretic.     Comments: Cachectic  HENT:     Head: Normocephalic and atraumatic.  Eyes:     Pupils: Pupils are equal, round, and reactive to light.  Cardiovascular:     Rate and Rhythm: Normal rate and regular rhythm.     Heart sounds: No murmur heard.    No friction rub. No gallop.  Pulmonary:     Effort: Pulmonary effort is normal.     Breath sounds: No wheezing or rales.  Abdominal:     General: There is no distension.     Palpations: Abdomen is soft.     Tenderness: There is no abdominal tenderness.  Musculoskeletal:        General: No tenderness.     Cervical back: Normal range of motion and neck supple.  Skin:    General: Skin is warm and dry.  Neurological:     Comments: Sleepy on exam.  Will open eyes to voice.  Answers limited questions.     ED Results / Procedures / Treatments   Labs (all labs ordered are listed, but only abnormal results are displayed) Labs Reviewed  CBC WITH DIFFERENTIAL/PLATELET - Abnormal; Notable for the following components:      Result Value   MCHC 29.3 (*)    All other components within normal limits  COMPREHENSIVE METABOLIC PANEL - Abnormal; Notable for the following components:   Sodium 153 (*)    CO2 42 (*)    Glucose, Bld 129 (*)    All other components within normal limits  CULTURE, BLOOD (ROUTINE X 2)  CULTURE, BLOOD (ROUTINE X 2)  PROTIME-INR  APTT  URINALYSIS, W/ REFLEX TO CULTURE (INFECTION SUSPECTED)  BLOOD GAS, VENOUS  I-STAT CG4 LACTIC ACID, ED  I-STAT CHEM 8, ED  I-STAT CG4 LACTIC ACID, ED    EKG EKG Interpretation Date/Time:  Wednesday March 08 2023 13:24:08 EST Ventricular Rate:  95 PR Interval:  138 QRS Duration:  90 QT Interval:  345 QTC Calculation: 434 R Axis:   62  Text Interpretation: Sinus rhythm Right atrial enlargement Consider left ventricular hypertrophy No old tracing to compare Confirmed by Melene Plan [phone]) on  03/08/2023 1:41:03 PM  Radiology CT Head Wo Contrast  Result Date: 03/08/2023 CLINICAL DATA:  Delirium EXAM: CT HEAD WITHOUT CONTRAST TECHNIQUE: Contiguous axial images were obtained from the base of the skull through the vertex without intravenous contrast. RADIATION DOSE REDUCTION: This exam was performed according to the departmental dose-optimization program which includes automated exposure control, adjustment of the mA and/or kV according to patient size and/or use of iterative reconstruction technique. COMPARISON:  Brain MR 07/05/22 FINDINGS: Brain: No hemorrhage. No hydrocephalus. No extra-axial fluid collection. No CT evidence of an acute cortical infarct. No mass effect. No mass lesion. Vascular: No hyperdense vessel or  unexpected calcification. Skull: Normal. Negative for fracture or focal lesion. Sinuses/Orbits: No middle ear or mastoid effusion. Paranasal sinuses are clear. Orbits are unremarkable. Other: None. IMPRESSION: No acute intracranial abnormality. Electronically Signed   By: Lorenza Cambridge M.D.   On: 03/08/2023 16:54   DG Chest Port 1 View  Result Date: 03/08/2023 CLINICAL DATA:  Altered level of consciousness, hypoxia, sepsis EXAM: PORTABLE CHEST 1 VIEW COMPARISON:  03/03/2023 FINDINGS: Single frontal view of the chest demonstrates an unremarkable cardiac silhouette. No airspace disease, effusion, or pneumothorax. No acute bony abnormalities. IMPRESSION: 1. No acute intrathoracic process. Electronically Signed   By: Sharlet Salina M.D.   On: 03/08/2023 16:43    Procedures .Critical Care  Performed by: Melene Plan, DO Authorized by: Melene Plan, DO   Critical care provider statement:    Critical care time (minutes):  35   Critical care time was exclusive of:  Separately billable procedures and treating other patients   Critical care was time spent personally by me on the following activities:  Development of treatment plan with patient or surrogate, discussions with  consultants, evaluation of patient's response to treatment, examination of patient, ordering and review of laboratory studies, ordering and review of radiographic studies, ordering and performing treatments and interventions, pulse oximetry, re-evaluation of patient's condition and review of old charts   Care discussed with: admitting provider       Medications Ordered in ED Medications  naloxone [facility]) injection 0.4 mg (0.4 mg Intravenous Given 03/08/23 1324)  sodium chloride 0.9 % bolus 1,000 mL (0 mLs Intravenous Stopped 03/08/23 1623)  naloxone (NARCAN) injection 2 mg (2 mg Intravenous Given 03/08/23 1520)  lactated ringers bolus 1,000 mL (1,000 mLs Intravenous New Bag/Given 03/08/23 1620)    ED Course/ Medical Decision Making/ A&P                                 Medical Decision Making Amount and/or Complexity of Data Reviewed Labs: ordered. Radiology: ordered. ECG/medicine tests: ordered.  Risk Prescription drug management.   72 yo F with a chief complaints of altered mental status.  The patient went to have endoscopy performed today and was too somnolent to get started.  Was sent here for evaluation.  He was hypoxic, decreased respirations for me.  Given pulse dose of Narcan.  Laboratory evaluation to assess for acute electrolyte abnormality or infectious cause of symptoms.  Chest x-ray UA.  Patient's daughters have arrived and provides some further history.  States that she has had a precipitous decline since about February of this year.  Had her upper teeth removed and since then has not really been eating and drinking well.  She has scheduled colonoscopy today and has an endoscopy scheduled in a few weeks.  No obvious infectious symptoms.  No recent change in medications.  They have been taking her off of her antipsychotics due to progressive weight loss.  Was reportedly at baseline until about 930 this morning.   Patient continues to be quite sleepy on exam.  Two doses  of narcan without improvement.    Discussed code status with family.  Agree full code but would want to hold off on intubation at the moment.  Na level elevated.  Giving fluids.    Awaiting CT head, cxr.   Signed out to Dr. Particia Nearing, please see their note for further details of care in the ED.  The patients results and plan were  reviewed and discussed.   Any x-rays performed were independently reviewed by myself.   Differential diagnosis were considered with the presenting HPI.  Medications  naloxone [facility]) injection 0.4 mg (0.4 mg Intravenous Given 03/08/23 1324)  sodium chloride 0.9 % bolus 1,000 mL (0 mLs Intravenous Stopped 03/08/23 1623)  naloxone [facility]) injection 2 mg (2 mg Intravenous Given 03/08/23 1520)  lactated ringers bolus 1,000 mL (1,000 mLs Intravenous New Bag/Given 03/08/23 1620)    Vitals:   03/08/23 1300 03/08/23 1410 03/08/23 1445 03/08/23 1500  BP: (!) 165/89  (!) 174/88 (!) 172/93  Pulse:  (!) 105 (!) 102 100  Resp: (!) 24 (!) 35 15 19  Temp:      SpO2:  100% 100% 100%    Final diagnoses:  Transient alteration of awareness           Final Clinical Impression(s) / ED Diagnoses Final diagnoses:  Transient alteration of awareness    Rx / DC Orders ED Discharge Orders     None         Melene Plan, DO 03/08/23 1657

## 2023-03-08 NOTE — Progress Notes (Signed)
Patient presented for her colonoscopy procedure appearing groggy and saturating in the 70-80% range on SpO2. Patient does appear slightly short of breath as well. We recommended that she proceed to the ED for further evaluation of a source of her hypoxia and lethargy. Ambulance will be called to be able to provide transportation. Patient's daughter does state that she did not get much sleep last night. She only drank about half of her colonoscopy prep last night as well. Patient does have some confusion, which has been present for the last year per her daughter.

## 2023-03-08 NOTE — H&P (Signed)
NAME:  Karen [name], MRN:  [id number], DOB:  1951-02-23, LOS: 0 ADMISSION DATE:  03/08/2023, CONSULTATION DATE:  03/08/23 REFERRING MD:  Particia Nearing, MD CHIEF COMPLAINT:  AMS, hypercapnic respiratory failure   History of Present Illness:  Karen [name] is a 72 year old woman with history of hypertension, HLD, chronic pain, hypothyroidism, insomnia and depression who presented for colonoscopy earlier today but was found to be drowsy with low oxygen saturations. She was sent to the ER for further evaluation.   In the ER she was given a dose of narcan without response. Per the daughters she has had a decline since February of this year. She had her upper teeth removed and has not been eating or drinking well since but was losing weight prior to having teeth pulled. She has lost close to 100lbs in total. She was reportedly at her baseline this morning at 9:30am. CT Head is unremarkable.   VBG shows pH 7.15 and pCO2 > 123. She was placed on bipap for an hour and had no improvement in VBG results so she was intubated. After intubation patient became more alert and agitated requiring sedation. She also had hypotension requiring initiation of vasopressor support post-intubation but has been weaned off already.  PFTs from 2019 were within normal limits. Serum bicarb 42 on admission and 30 on 11/11/22.  CTA Chest shows multiple subsegmental pulmonary emboli.   PCCM consulted for admission.  Patient's daughter and son at bedside. Patient to be full code per family.  No history of smoking. Not on narcotic pain medications.  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 Three variants of uncertain significance were detected - one in the AXIN2 gene called c.1829G>A, one in the PMS2 gene called c.964G>A, and one in the RNF43 gene called c.1114C>T. The report date is 06/24/2019.     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 CTA Chest shows multiple subsegmental PE  Interim History / Subjective:  As above in HPI  Objective   Blood pressure (!) 117/99, pulse (!) 112, temperature 97.9 F (36.6 C), temperature source Axillary, resp. rate 19, SpO2 (!) 82%.    Vent Mode: PRVC FiO2 (%):  [40 %-100 %] 100 % Set Rate:  [20  bmp] 20 bmp Vt Set:  [440 mL] 440 mL PEEP:  [5 cmH20] 5 cmH20 Plateau Pressure:  [15 cmH20] 15 cmH20  No intake or output data in the 24 hours ending 03/08/23 2008 There were no vitals filed for this visit.  Examination: General: elderly woman, ill appearing, intubated, sedated HENT: [place]/AT, moist mucous membranes Lungs: course breath sounds, wheezing on right Cardiovascular: tachycardic, no murmurs Abdomen: soft, non-distended, BS+ Extremities: warm, no  edema Neuro: sedated, PERRL GU: foley in place  Resolved Hospital Problem list     Assessment & Plan:  Acute Hypercapnic and Hypoxemic Respiratory Failure Multiple Subsegmental Pulmonary Emboli - continue mechanical ventilatory support - PAD protocol: fentanyl and propofol for sedation/analgesia - VAP bundle ordered - Repeat blood gas after vent adjustments, reduced RR to 16 from 20 based on ABG after intubation. There appears to be a chronic hypercapnic respiratory failure component.  - Plan for SBT and sedation wean in the AM - start scheduled duoneb treatments - start heparin drip for pulmonary emboli - check lower extremity dopplers and ECHO  Acute Encephalopathy - likely in setting of hypercapnia - check urinalysis with reflex culture - check TSH, B12, RPR, HIV, drug screen  Hypernatremia - FW flushes q4hrs  Weight Loss Failure to Thrive - will need nutrition consult - start TF tomorrow if remains intubated - Consider inpatient colonoscopy to expedite workup while here since this was planned for outpatient already - Follow up labs as above  Hypertension - PRN hydralazine 10mg  IV for sBP or greater  Best Practice (right click and "Reselect all SmartList Selections" daily)   Diet/type: NPO w/ meds via tube DVT prophylaxis: LMWH Pressure ulcer(s). pressure injury stage   was NOT present on admission GI prophylaxis: H2B Lines: N/A Foley:  N/A Code Status:  full code Last date of multidisciplinary goals of care discussion [Discussed with patient's daughter and son, patient to be full code at this time]  Labs   CBC: Recent Labs  Lab 03/08/23 1242  WBC 6.4  NEUTROABS 4.5  HGB 13.2  HCT 45.1  MCV 97.8  PLT 237    Basic Metabolic Panel: Recent Labs  Lab 03/08/23 1430  NA 153*  K 3.5  CL 103  CO2 42*  GLUCOSE 129*  BUN 20  CREATININE 0.52  CALCIUM 9.7   GFR: Estimated Creatinine Clearance: 56.5 mL/min (by C-G formula based on SCr of  0.52 mg/dL). Recent Labs  Lab 03/08/23 1242 03/08/23 1445 03/08/23 1858  WBC 6.4  --   --   LATICACIDVEN  --  0.7 0.9    Liver Function Tests: Recent Labs  Lab 03/08/23 1430  AST 27  ALT 21  ALKPHOS 44  BILITOT 0.5  PROT 7.4  ALBUMIN 4.0   No results for input(s): "LIPASE", "AMYLASE" in the last 168 hours. No results for input(s): "AMMONIA" in the last 168 hours.  ABG    Component Value Date/Time   HCO3 47.4 (H) 03/08/2023 1847   O2SAT 58.4 03/08/2023 1847     Coagulation Profile: Recent Labs  Lab 03/08/23 1242  INR 1.1    Cardiac Enzymes: No results for input(s): "CKTOTAL", "CKMB", "CKMBINDEX", "TROPONINI" in the last 168 hours.  HbA1C: No results found for: "HGBA1C"  CBG: No results for input(s): "GLUCAP" in the last 168 hours.  Review of Systems:   Unable to perform ROS due to patient being intubated  Past Medical History:  She,  has a past medical history of Anemia, Arthritis, Bilateral  sensorineural hearing loss (03/26/2019), Calcium pyrophosphate deposition disease (07/20/2021), Cognitive impairment, Family history of breast cancer, Family history of lung cancer, Family history of ovarian cancer, Family history of pancreatic cancer, Family history of throat cancer, Generalized anxiety disorder (03/26/2019), Genetic testing (06/24/2019), GERD (gastroesophageal reflux disease), History of colonic polyps, Hyperlipidemia, Hypertension, Hypothyroidism, Major depressive disorder, Osteoarthritis of knee (07/20/2021), 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   Surgical History:   Past Surgical History:  Procedure Laterality Date   CESAREAN SECTION     X 4   COLONOSCOPY     COLONOSCOPY WITH PROPOFOL N/A 11/17/2015   Procedure: COLONOSCOPY WITH PROPOFOL;  Surgeon: Charolett Bumpers, MD;  Location: [facility] ENDOSCOPY;  Service: Endoscopy;  Laterality: N/A;   POLYPECTOMY       Social History:    reports that she quit smoking about 22 years ago. Her smoking use included cigarettes. She started smoking about 32 years ago. She has never used smokeless tobacco. She reports that she does not currently use drugs. She reports that she does not drink alcohol.   Family History:  Her family history includes Breast cancer (age of onset: 33) in her cousin; Hypertension in her mother; Lung cancer (age of onset: 42) in her maternal aunt; Ovarian cancer (age of onset: 72) in her maternal grandmother; Pancreatic cancer (age of onset: 8) in her half-brother and son; Throat cancer in her maternal aunt. There is no history of Colon cancer, Esophageal cancer, Colon polyps, Rectal cancer, or Stomach cancer.   Allergies Allergies  Allergen Reactions   Zoloft [Sertraline Hcl] Anxiety and Other (See Comments)    "crazy feeling"     Home Medications  Prior to Admission medications   Medication Sig Start Date End Date Taking? Authorizing Provider  acetaminophen (TYLENOL) 500 MG tablet Take 500 mg by mouth every 6 (six) hours as needed (pain).    [provider]  atenolol (TENORMIN) 50 MG tablet Take 50 mg by mouth daily. for blood pressure 11/03/14   [provider]  cholecalciferol (VITAMIN D) 1000 UNITS tablet Take 1,000 Units by mouth daily.    [provider]  donepezil (ARICEPT) 10 MG tablet Take 1 tablet (10 mg total) by mouth daily. 02/23/23   Marcos Eke, PA-C  ELDERBERRY PO Take 1 tablet by mouth daily.    [provider]  escitalopram (LEXAPRO) 20 MG tablet Take 20 mg by mouth daily.    [provider]  ezetimibe (ZETIA) 10 MG tablet Take 10 mg by mouth daily. 06/10/19   [provider]  fluticasone (FLONASE) 50 MCG/ACT nasal spray Place 1 spray into both nostrils daily as needed for allergies or rhinitis.    [provider]  LORazepam (ATIVAN) 2 MG tablet Take 1 tablet (2 mg total) by mouth as directed. Take 20-30 minutes before MRI  procedure 01/05/23   Unk Lightning, PA  methylcellulose (CITRUCEL) oral powder Take 1 packet by mouth daily. 02/12/21   Rachael Fee, MD  Multiple Vitamin (MULTIVITAMIN WITH MINERALS) TABS tablet Take 1 tablet by mouth daily.    [provider]  oxyCODONE-acetaminophen (PERCOCET/ROXICET) 5-325 MG tablet Take 1 tablet by mouth every 6 (six) hours as needed for severe pain. 11/11/22   Henderly, Britni A, PA-C  pantoprazole (PROTONIX) 20 MG tablet Take 1 tablet (20 mg total) by mouth daily. 11/11/22   Henderly, Britni A, PA-C  SYNTHROID 75 MCG tablet Take 75 mcg by mouth  daily. 01/22/20   [provider]  traMADol (ULTRAM) 50 MG tablet Take 1-2 tablets (50-100 mg total) by mouth every 6 (six) hours as needed. Up to twice daily as needed for pain 08/10/16   Tobey Grim, MD  triamterene-hydrochlorothiazide (MAXZIDE) 75-50 MG per tablet Take 1 tablet by mouth daily. 10/27/14   [provider]  TURMERIC PO Take 1 capsule by mouth daily.    [provider]  vitamin C (ASCORBIC ACID) 500 MG tablet Take 500 mg by mouth daily.    [provider]     Critical care time: 50 minutes    Melody Comas, MD [facility] Office: [phone]   See Amion for personal pager PCCM on call pager [phone] until 7pm. Please call Elink 7p-7a. [phone]

## 2023-03-09 ENCOUNTER — Inpatient Hospital Stay (HOSPITAL_COMMUNITY): Payer: Medicare HMO

## 2023-03-09 ENCOUNTER — Other Ambulatory Visit: Payer: Self-pay

## 2023-03-09 DIAGNOSIS — E870 Hyperosmolality and hypernatremia: Secondary | ICD-10-CM

## 2023-03-09 DIAGNOSIS — G9340 Encephalopathy, unspecified: Secondary | ICD-10-CM

## 2023-03-09 DIAGNOSIS — J9601 Acute respiratory failure with hypoxia: Secondary | ICD-10-CM

## 2023-03-09 DIAGNOSIS — J9602 Acute respiratory failure with hypercapnia: Secondary | ICD-10-CM

## 2023-03-09 DIAGNOSIS — I2699 Other pulmonary embolism without acute cor pulmonale: Secondary | ICD-10-CM | POA: Diagnosis not present

## 2023-03-09 DIAGNOSIS — R0603 Acute respiratory distress: Secondary | ICD-10-CM | POA: Diagnosis not present

## 2023-03-09 DIAGNOSIS — R609 Edema, unspecified: Secondary | ICD-10-CM

## 2023-03-09 DIAGNOSIS — I2694 Multiple subsegmental pulmonary emboli without acute cor pulmonale: Secondary | ICD-10-CM | POA: Diagnosis present

## 2023-03-09 LAB — BLOOD GAS, ARTERIAL
Acid-Base Excess: 9.3 mmol/L — ABNORMAL HIGH (ref 0.0–2.0)
Acid-Base Excess: 9.7 mmol/L — ABNORMAL HIGH (ref 0.0–2.0)
Bicarbonate: 38.5 mmol/L — ABNORMAL HIGH (ref 20.0–28.0)
Bicarbonate: 37.9 mmol/L — ABNORMAL HIGH (ref 20.0–28.0)
Drawn by: 25770
Drawn by: 257701
FIO2: 35 %
FIO2: 30 %
MECHVT: 330 mL
MECHVT: 330 mL
O2 Saturation: 100 %
O2 Saturation: 100 %
PEEP: 5 cmH2O
PEEP: 5 cmH2O
Patient temperature: 37.3
Patient temperature: 37.8
RATE: 12 {breaths}/min
RATE: 14 {breaths}/min
pCO2 arterial: 83 mm[Hg] (ref 32–48)
pCO2 arterial: 69 mm[Hg] (ref 32–48)
pH, Arterial: 7.28 — ABNORMAL LOW (ref 7.35–7.45)
pH, Arterial: 7.35 (ref 7.35–7.45)
pO2, Arterial: 115 mm[Hg] — ABNORMAL HIGH (ref 83–108)
pO2, Arterial: 110 mm[Hg] — ABNORMAL HIGH (ref 83–108)

## 2023-03-09 LAB — CBC
HCT: 34.5 % — ABNORMAL LOW (ref 36.0–46.0)
Hemoglobin: 10.1 g/dL — ABNORMAL LOW (ref 12.0–15.0)
MCH: 29.1 pg (ref 26.0–34.0)
MCHC: 29.3 g/dL — ABNORMAL LOW (ref 30.0–36.0)
MCV: 99.4 fL (ref 80.0–100.0)
Platelets: 191 10*3/uL (ref 150–400)
RBC: 3.47 MIL/uL — ABNORMAL LOW (ref 3.87–5.11)
RDW: 13.1 % (ref 11.5–15.5)
WBC: 9.3 10*3/uL (ref 4.0–10.5)
nRBC: 0 % (ref 0.0–0.2)

## 2023-03-09 LAB — BASIC METABOLIC PANEL
Anion gap: 9 (ref 5–15)
Anion gap: 8 (ref 5–15)
BUN: 17 mg/dL (ref 8–23)
BUN: 16 mg/dL (ref 8–23)
CO2: 33 mmol/L — ABNORMAL HIGH (ref 22–32)
CO2: 33 mmol/L — ABNORMAL HIGH (ref 22–32)
Calcium: 8.9 mg/dL (ref 8.9–10.3)
Calcium: 8.8 mg/dL — ABNORMAL LOW (ref 8.9–10.3)
Chloride: 107 mmol/L (ref 98–111)
Chloride: 101 mmol/L (ref 98–111)
Creatinine, Ser: 0.49 mg/dL (ref 0.44–1.00)
Creatinine, Ser: 0.46 mg/dL (ref 0.44–1.00)
GFR, Estimated: >60 mL/min (ref >60)
GFR, Estimated: >60 mL/min (ref >60)
Glucose, Bld: 80 mg/dL (ref 70–99)
Glucose, Bld: 94 mg/dL (ref 70–99)
Potassium: 3.2 mmol/L — ABNORMAL LOW (ref 3.5–5.1)
Potassium: 3.4 mmol/L — ABNORMAL LOW (ref 3.5–5.1)
Sodium: 149 mmol/L — ABNORMAL HIGH (ref 135–145)
Sodium: 142 mmol/L (ref 135–145)

## 2023-03-09 LAB — RESPIRATORY PANEL BY PCR

## 2023-03-09 LAB — AMMONIA: Ammonia: 27 umol/L (ref 9–35)

## 2023-03-09 LAB — RAPID URINE DRUG SCREEN, HOSP PERFORMED
Amphetamines: NOT DETECTED
Barbiturates: NOT DETECTED
Benzodiazepines: NOT DETECTED
Cocaine: NOT DETECTED
Opiates: NOT DETECTED
Tetrahydrocannabinol: NOT DETECTED

## 2023-03-09 LAB — ECHOCARDIOGRAM COMPLETE
AR max vel: 2 cm2
AV Peak grad: 12.3 mm[Hg]
Ao pk vel: 1.75 m/s
Area-P 1/2: 3.93 cm2
Height: 64 in
S' Lateral: 2.1 cm
Weight: 1788.37 [oz_av]

## 2023-03-09 LAB — MAGNESIUM
Magnesium: 2 mg/dL (ref 1.7–2.4)
Magnesium: 2 mg/dL (ref 1.7–2.4)

## 2023-03-09 LAB — GLUCOSE, CAPILLARY
Glucose-Capillary: 104 mg/dL — ABNORMAL HIGH (ref 70–99)
Glucose-Capillary: 122 mg/dL — ABNORMAL HIGH (ref 70–99)
Glucose-Capillary: 59 mg/dL — ABNORMAL LOW (ref 70–99)
Glucose-Capillary: 68 mg/dL — ABNORMAL LOW (ref 70–99)
Glucose-Capillary: 74 mg/dL (ref 70–99)
Glucose-Capillary: 78 mg/dL (ref 70–99)

## 2023-03-09 LAB — HEPARIN LEVEL (UNFRACTIONATED)
Heparin Unfractionated: 0.86 [IU]/mL — ABNORMAL HIGH (ref 0.30–0.70)
Heparin Unfractionated: >1.1 [IU]/mL — ABNORMAL HIGH (ref 0.30–0.70)
Heparin Unfractionated: 0.9 [IU]/mL — ABNORMAL HIGH (ref 0.30–0.70)

## 2023-03-09 LAB — TYPE AND SCREEN
ABO/RH(D): AB POS
Antibody Screen: NEGATIVE

## 2023-03-09 LAB — T4, FREE: Free T4: 1.13 ng/dL — ABNORMAL HIGH (ref 0.61–1.12)

## 2023-03-09 LAB — TROPONIN I (HIGH SENSITIVITY): Troponin I (High Sensitivity): 32 ng/L — ABNORMAL HIGH (ref <18)

## 2023-03-09 LAB — RPR: RPR Ser Ql: NONREACTIVE

## 2023-03-09 LAB — VITAMIN B12: Vitamin B-12: 1393 pg/mL — ABNORMAL HIGH (ref 180–914)

## 2023-03-09 LAB — HIV ANTIBODY (ROUTINE TESTING W REFLEX): HIV Screen 4th Generation wRfx: NONREACTIVE

## 2023-03-09 LAB — LIPASE, BLOOD: Lipase: 27 U/L (ref 11–51)

## 2023-03-09 LAB — PHOSPHORUS
Phosphorus: 1.1 mg/dL — ABNORMAL LOW (ref 2.5–4.6)
Phosphorus: 4.5 mg/dL (ref 2.5–4.6)

## 2023-03-09 LAB — TRIGLYCERIDES: Triglycerides: 67 mg/dL (ref <150)

## 2023-03-09 LAB — TSH: TSH: 1.145 u[IU]/mL (ref 0.350–4.500)

## 2023-03-09 LAB — MRSA NEXT GEN BY PCR, NASAL: MRSA by PCR Next Gen: NOT DETECTED

## 2023-03-09 LAB — AMYLASE: Amylase: 461 U/L — ABNORMAL HIGH (ref 28–100)

## 2023-03-09 MED ORDER — ORAL CARE MOUTH RINSE
15.0000 mL | OROMUCOSAL | Status: DC
  Administered 2023-03-09 – 2023-03-18 (×112): 15 mL via OROMUCOSAL

## 2023-03-09 MED ORDER — LORAZEPAM 2 MG/ML IJ SOLN: 2.0000 mg | Freq: Once | INTRAMUSCULAR | Status: AC

## 2023-03-09 MED ORDER — CHLORHEXIDINE GLUCONATE CLOTH 2 % EX PADS: 6.0000 | MEDICATED_PAD | Freq: Every day | CUTANEOUS | Status: DC

## 2023-03-09 MED ORDER — DEXTROSE 50 % IV SOLN
INTRAVENOUS | Status: AC
  Filled 2023-03-09: qty 50

## 2023-03-09 MED ORDER — PANTOPRAZOLE SODIUM 40 MG IV SOLR: 40.0000 mg | Freq: Two times a day (BID) | INTRAVENOUS | Status: DC

## 2023-03-09 MED ORDER — DEXTROSE 50 % IV SOLN
12.5000 g | INTRAVENOUS | Status: AC
Start: 2023-03-09 — End: 2023-03-09
  Administered 2023-03-09: 12.5 g via INTRAVENOUS

## 2023-03-09 MED ORDER — CARMEX CLASSIC LIP BALM EX OINT
TOPICAL_OINTMENT | CUTANEOUS | Status: DC | PRN
  Administered 2023-03-28 – 2023-04-05 (×2): 1 via TOPICAL
  Filled 2023-03-09 (×3): qty 10

## 2023-03-09 MED ORDER — ORAL CARE MOUTH RINSE: 15.0000 mL | OROMUCOSAL | Status: DC | PRN

## 2023-03-09 MED ORDER — HEPARIN (PORCINE) 25000 UT/250ML-% IV SOLN
750.0000 [IU]/h | INTRAVENOUS | Status: DC
  Administered 2023-03-09: 750 [IU]/h via INTRAVENOUS

## 2023-03-09 MED ORDER — ACETAMINOPHEN 325 MG PO TABS
650.0000 mg | ORAL_TABLET | Freq: Four times a day (QID) | ORAL | Status: DC | PRN
  Administered 2023-03-09 – 2023-03-17 (×16): 650 mg
  Filled 2023-03-09 (×16): qty 2

## 2023-03-09 MED ORDER — SODIUM CHLORIDE 0.9 % IV SOLN
250.0000 mL | INTRAVENOUS | Status: DC
  Administered 2023-03-09: 250 mL via INTRAVENOUS

## 2023-03-09 MED ORDER — THIAMINE MONONITRATE 100 MG PO TABS
100.0000 mg | ORAL_TABLET | Freq: Every day | ORAL | Status: DC
  Administered 2023-03-09 – 2023-03-11 (×3): 100 mg
  Filled 2023-03-09 (×3): qty 1

## 2023-03-09 MED ORDER — LORAZEPAM 2 MG/ML IJ SOLN
INTRAMUSCULAR | Status: AC
  Administered 2023-03-09: 2 mg via INTRAVENOUS
  Filled 2023-03-09: qty 2

## 2023-03-09 MED ORDER — VITAL AF 1.2 CAL PO LIQD
1000.0000 mL | ORAL | Status: DC
  Administered 2023-03-09 – 2023-03-10 (×2): 1000 mL

## 2023-03-09 MED ORDER — NOREPINEPHRINE 4 MG/250ML-% IV SOLN
2.0000 ug/min | INTRAVENOUS | Status: DC
  Administered 2023-03-09: 3 ug/min via INTRAVENOUS
  Administered 2023-03-09 (×2): 2 ug/min via INTRAVENOUS
  Administered 2023-03-10: 3 ug/min via INTRAVENOUS
  Filled 2023-03-09 (×2): qty 250

## 2023-03-09 MED ORDER — POTASSIUM CHLORIDE 20 MEQ PO PACK
40.0000 meq | PACK | Freq: Once | ORAL | Status: AC
  Administered 2023-03-09: 40 meq
  Filled 2023-03-09: qty 2

## 2023-03-09 MED ORDER — HEPARIN (PORCINE) 25000 UT/250ML-% IV SOLN
1200.0000 [IU]/h | INTRAVENOUS | Status: DC
Start: 2023-03-09 — End: 2023-03-28
  Administered 2023-03-09: 550 [IU]/h via INTRAVENOUS
  Administered 2023-03-11: 800 [IU]/h via INTRAVENOUS
  Administered 2023-03-13 – 2023-03-17 (×5): 1050 [IU]/h via INTRAVENOUS
  Administered 2023-03-18 – 2023-03-28 (×12): 1200 [IU]/h via INTRAVENOUS
  Filled 2023-03-09 (×19): qty 250

## 2023-03-09 MED ORDER — CHLORHEXIDINE GLUCONATE CLOTH 2 % EX PADS
6.0000 | MEDICATED_PAD | Freq: Every day | CUTANEOUS | Status: DC
  Administered 2023-03-09 – 2023-04-04 (×27): 6 via TOPICAL

## 2023-03-09 MED ORDER — PANTOPRAZOLE SODIUM 40 MG IV SOLR
40.0000 mg | INTRAVENOUS | Status: DC
  Administered 2023-03-10 – 2023-03-17 (×9): 40 mg via INTRAVENOUS
  Filled 2023-03-09 (×9): qty 10

## 2023-03-09 NOTE — Progress Notes (Signed)
Lower extremity venous duplex completed. Please see CV Procedures for preliminary results.  Initial findings reported to Gayland Curry, Charity fundraiser.  Shona Simpson, RVT 03/09/23 2:35 PM

## 2023-03-09 NOTE — Progress Notes (Signed)
Echocardiogram 2D Echocardiogram has been performed.  Karen [name] 03/09/2023, 9:52 AM

## 2023-03-09 NOTE — ED Notes (Signed)
RT Note: Pt. transported uneventfully from [facility] to [facility], RT covering unit aware.

## 2023-03-09 NOTE — TOC Initial Note (Signed)
Transition of Care [facility]) - Initial/Assessment Note    Patient Details  Name: Karen [name] MRN: [id number] Date of Birth: 03-Aug-1950  Transition of Care [facility]) CM/SW Contact:    Adrian Prows, RN Phone Number: 03/09/2023, 1:17 PM  Clinical Narrative:                 Pt sedated on ventilator; spoke w/ dtrs in room; her POC is [name] [phone]); she says pt lives at home w/ her husband; they plan for her to return at d/c; she denies pt experiencing SDOH risks; pt has transportation; Ms Grissom says pt has glasses and upper partial; pt does not have DME, HH services, or home oxygen; TOC will follow.  Expected Discharge Plan: Home/Self Care Barriers to Discharge: Continued Medical Work up   Patient Goals and CMS Choice Patient states their goals for this hospitalization and ongoing recovery are:: patient will return home per dtr [name] CMS Medicare.gov Compare Post Acute Care list provided to:: Patient Represenative (must comment) (d[name])        Expected Discharge Plan and Services   Discharge Planning Services: CM Consult   Living arrangements for the past 2 months: Single Family Home                                      Prior Living Arrangements/Services Living arrangements for the past 2 months: Single Family Home Lives with:: Spouse Patient language and need for interpreter reviewed:: Yes Do you feel safe going back to the place where you live?: Yes      Need for Family Participation in Patient Care: Yes (Comment) Care giver support system in place?: Yes (comment) Current home services:  (n/a) Criminal Activity/Legal Involvement Pertinent to Current Situation/Hospitalization: No - Comment as needed  Activities of Daily Living      Permission Sought/Granted Permission sought to share information with : Case Manager Permission granted to share information with : Yes, Verbal Permission  Granted  Share Information with NAME: Case Manager     Permission granted to share info w Relationship: Mabeth Carrazana (dtr) [phone]     Emotional Assessment Appearance:: Appears stated age Attitude/Demeanor/Rapport: Unable to Assess Affect (typically observed): Unable to Assess Orientation: :  (unable to assess; pt sedated on ventilator) Alcohol / Substance Use: Not Applicable Psych Involvement: No (comment)  Admission diagnosis:  Transient alteration of awareness [R40.4] Hypernatremia [E87.0] Acute respiratory failure with hypoxia and hypercapnia (HCC) [J96.01, J96.02] Hypercapnic respiratory failure (HCC) [J96.92] Multiple subsegmental pulmonary emboli without acute cor pulmonale (HCC) [I26.94] Patient Active Problem List   Diagnosis Date Noted   Acute respiratory failure with hypoxia and hypercapnia (HCC) 03/09/2023   Multiple subsegmental pulmonary emboli without acute cor pulmonale (HCC) 03/09/2023   Hypernatremia 03/09/2023   Hypercapnic respiratory failure (HCC) 03/08/2023   Cognitive impairment    Hypertension    Hypothyroidism    GERD (gastroesophageal reflux disease)    Hyperlipidemia    Major depressive disorder    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   Osteoarthritis of knee 07/20/2021   Family history of pancreatic cancer    Family history of ovarian cancer    Family history of breast cancer    Family history of lung cancer    Family history of throat cancer    Generalized anxiety disorder 03/26/2019   Bilateral sensorineural hearing loss 03/26/2019   Subjective tinnitus of both  ears 03/26/2019   PCP:  Fatima Sanger, FNP Pharmacy:   [facility] [phone] - [place], [place] - [address] AT [facility] [address] [place] [address] Phone: [phone] Fax: [phone]     Social Determinants of Health (SDOH) Social History: SDOH Screenings   Food Insecurity: No Food Insecurity  (03/09/2023)  Housing: Patient Unable To Answer (03/09/2023)  Transportation Needs: No Transportation Needs (03/09/2023)  Utilities: Not At Risk (03/09/2023)  Depression (PHQ2-9): Low Risk  (03/23/2022)  Tobacco Use: Medium Risk (03/08/2023)   SDOH Interventions: Food Insecurity Interventions: Intervention Not Indicated, Inpatient TOC Housing Interventions: Intervention Not Indicated, Inpatient TOC Transportation Interventions: Intervention Not Indicated, Inpatient TOC Utilities Interventions: Intervention Not Indicated, Inpatient TOC   Readmission Risk Interventions    03/09/2023    1:14 PM  Readmission Risk Prevention Plan  Transportation Screening Complete  PCP or Specialist Appt within 5-7 Days Complete  Home Care Screening Complete  Medication Review (RN CM) Complete

## 2023-03-09 NOTE — Progress Notes (Signed)
PCCM INTERVAL PROGRESS NOTE   Called to bedside for full body shaking. Had similar less severe event earlier in the day. Ceribell EEG subsequently was negative. Now again with full body shaking. Pupils 3mm and midline. Able to localize to pain during the episode but did not follow commands, which is consistent with her previous exam. Episode abated just as ativan was being administered so really it stopped spontaneously. Lasted approximately 45 seconds. Was hypoglycemic to 65. Dextrose given. Will continue to monitor. If continues may need transfer to [facility] for continuous EEG.   Joneen Roach, AGACNP-BC [facility]  See Amion for personal pager PCCM on call pager [phone] until 7pm. Please call Elink 7p-7a. [phone]  03/09/2023 5:01 PM

## 2023-03-09 NOTE — Progress Notes (Signed)
Notified Lab ABG being sent for analysis. 

## 2023-03-09 NOTE — Progress Notes (Signed)
eLink Physician-Brief Progress Note Patient Name: [name] DOB: [birth year] MRN: [id number]   Date of Service  03/09/2023  HPI/Events of Note  72 year old woman with a history of essential hypertension, dyslipidemia, chronic pain, hypothyroidism and depression who initially presented for colonoscopy but developed drowsiness and hypoxia and was sent to the emergency department for further evaluation.  She was admitted with acute hypercapnic hypoxic respiratory failure in the setting of subsegmental pulmonary emboli.  Patient is febrile, cardiac, hypertensive saturating 43% on 40% FiO2 on the ventilator.  Medicated with fentanyl, propofol and low-dose norepinephrine.  Results consistent with appropriate oxygenation and ventilation.  Metabolic panel with hypokalemia, hypophosphatemia, and hypomagnesemia.  Chronic metabolic alkalosis.  Lactic acidosis.  Scattered subsegmental PE.  Chest radiograph with endotracheal tube in appropriate position, albeit a bit deep.  Enteric tube in the stomach.  eICU Interventions  Continue mechanical ventilation spontaneous awakening and breathing trials daily.  Reduce to 6 cc/kg ideal body weight.  Scheduled SVNs.  As needed SVNs.  Heparin drip  Likely needs outpatient sleep study and PFTs in the setting of chronic hypercapnia  GI prophylaxis with famotidine DVT prophylaxis with heparin infusion.     Intervention Category Evaluation Type: New Patient Evaluation  Kippy Gohman 03/09/2023, 1:23 AM

## 2023-03-09 NOTE — Progress Notes (Signed)
PHARMACY - ANTICOAGULATION CONSULT NOTE  Pharmacy Consult for heparin Indication: pulmonary embolus  Allergies  Allergen Reactions   Zoloft [Sertraline Hcl] Anxiety and Other (See Comments)    "crazy feeling"    Patient Measurements: Height: 5\' 4"  (162.6 cm) Weight: 50.7 kg (111 lb 12.4 oz) IBW/kg (Calculated) : 54.7 Heparin Dosing Weight: TBW  Vital Signs: Temp: 98.4 F (36.9 C) (11/21 0700) BP: 110/58 (11/21 0700) Pulse Rate: 83 (11/21 0700)  Labs: Recent Labs    03/08/23 1242 03/08/23 1430 03/08/23 2138 03/08/23 2332 03/09/23 0243  HGB 13.2  --   --   --  10.1*  HCT 45.1  --   --   --  34.5*  PLT 237  --   --   --  191  APTT 27  --   --   --   --   LABPROT 13.9  --   --   --   --   INR 1.1  --   --   --   --   HEPARINUNFRC  --   --   --   --  0.86*  CREATININE  --  0.52  --   --  0.49  TROPONINIHS  --   --  32* 32*  --     Estimated Creatinine Clearance: 52.4 mL/min (by C-G formula based on SCr of 0.49 mg/dL).   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 Three variants of uncertain significance were detected - one in the AXIN2 gene called c.1829G>A, one in the PMS2 gene called c.964G>A, and one in the RNF43 gene called c.1114C>T. The report date is 06/24/2019.     The Multi-Cancer Panel offered by [facility] includes sequencing and/or deletion duplication test   GERD (gastroesophageal reflux disease)    History of colonic polyps     Hyperlipidemia    Hypertension    Hypothyroidism    Major depressive disorder    Osteoarthritis of knee 07/20/2021   Subjective tinnitus of both ears 03/26/2019   Tick bite 08/2015   UTI (urinary tract infection) 10/2015   Assessment: 72 YO female presenting for a colonoscopy procedure 11/20 with SpO2 70-80% and slight shortness of breath. She was transferred to the [facility] ED where CTA PE revealed scattered subsegmental pulmonary emboli throughout the right lung, no RHS. She was intubated in the ED due to lack of improvement on BiPAP. No anticoagulation noted PTA. Pharmacy consulted for heparin dosing.   Today, 03/09/23: Heparin level due at 06:00 02:43 Heparin level = 0.86 was drawn too early Lab re-entered to be drawn at 06:00, but not drawn until 08:54 Lab reported in process, but at 11 am lab reported machine was being calibrated and wouldn't be available for another hour. Heparin level resulted supra-therapeutic at > 1.1 Heparin is infusing through peripheral IV in right forearm Phlebotomist unable to confirm where lab was drawn from.  RN thinks it  was drawn on the opposite arm.   Note weight decreased from 55.3 kg on admit to 50.7 kg today  CBC:  Hgb decreased to 10.1, Plt 191 No s/s bleeding reported.  No IV site issues or complications.     Goal of Therapy:  Heparin level 0.3-0.7 units/ml Monitor platelets by anticoagulation protocol: Yes   Plan:  HOLD heparin x1 hour Decrease to heparin IV infusion at 750 units/hr Heparin level in 6 hours after rate change Daily heparin level and CBC   Lynann Beaver PharmD, BCPS [facility] main pharmacy [phone] 03/09/2023 7:07 AM

## 2023-03-09 NOTE — Progress Notes (Addendum)
NAME:  Karen [name], MRN:  [id number], DOB:  Sep 06, 1950, LOS: 1 ADMISSION DATE:  03/08/2023, CONSULTATION DATE:  03/08/23  REFERRING MD: MD Halivand CHIEF COMPLAINT:  AMS, hypoxia   History of Present Illness:  Pt is a 72 yr old female with a significant pmhx of HTN, Hypothyroidism, Major Depressive Disorder, cognitive impairment, arthritis, hearing loss, 30-40lb unintentional weight loss since 05/2022. Per patient's husband and daughter endorse that patient has had a functional and cognitive decline since the beginning of the 2024. Patient has had an extensive workup in regards to cognitive impairment and unintentional weight loss with MRI Brain, MRCPs, CT of chest and abdomen with no definitive etiology.    Patient presented to [facility] ED with AMS and hypoxia (O2 Sats in 70s-80s) by EMS from [facility], who was scheduled for a colonoscopy on 11/20. Patient did not receive colonoscopy due to hypoxia and AMS. Initial VBG revealed pH 7.17 and pCO2 > 123. Patient was given narcan during the initial ED workup with no response and subsequently placed on BIPAP due to increasing hypoxia and respiratory distress. Repeat VBG showed no improvement and patient was intubated due to hypoxia/hypercarbia. Also, CT of chest obtained by EDP showed evidence of scattered subsegmental PE and PCCM was consulted for further vent management as well as management for PE.   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   Significant Hospital Events: Including procedures, antibiotic start and stop dates in addition to other pertinent events   11/20 Admit with AMS, acute hypoxic/hypercarbic respiratory failure, subsegmental PE intubated PCCM consult 11/21 Intubated, sedated, tremors  Interim History / Subjective:  Intubated, sedated   Objective   Blood pressure 124/73, pulse 72, temperature 98.2 F (36.8 C), resp. rate 18, height 5\' 4"  (1.626 m), weight 50.7 kg, SpO2 100%.    Vent Mode: PRVC FiO2 (%):  [40 %-100 %] 40 % Set Rate:  [12 bmp-20 bmp] 12 bmp Vt Set:  [330 mL-440 mL] 330 mL PEEP:  [5 cmH20] 5 cmH20 Plateau Pressure:  [15 cmH20-17 cmH20] 15 cmH20   Intake/Output Summary (Last 24 hours) at 03/09/2023 0848 Last data filed at 03/09/2023 0815 Gross per 24 hour  Intake 2065.43 ml  Output 75 ml  Net 1990.43 ml   Filed Weights   03/08/23 2100 03/09/23 0500  Weight: 55.3 kg 50.7 kg    Examination: General: chronically ill appearing adult, no acute distress  HENT: ETT, OG, missing upper teeth  Lungs: clear diminished BL breath sounds  Cardiovascular: s1, s2, RRR, no MRG, no JVD  Abdomen: soft, BS active  Extremities: generalized tremor, no edema Neuro: RASS -2, fine tremor, upward gaze, PERRLA intact GU: Foley  Resolved Hospital Problem list   N/a   Assessment & Plan:  Acute hypoxemic/hypercarbic respiratory failure  Subsegmental PEs  No clear etiology to determine cause of hypercarbia, PFTs (2019) within normal limits Possible hypoventilation from pleurisy from PE?  Full vent support  P:  Continue full vent support  Continue VAP Bundle PAD  protocol  SBT/SAT as tolerated  Wean propofol and fentanyl, RASS goal 0 to -1  Continue Heparin gtt   Acute Encephalopathy Suspect Secondary to  hypercarbia  Hx of cognitive impairment follows up with neurology outpatient  TSH 1.145  Having tremors and upward Gaze, no seizure history  P:  Will check Free T4, T3  Continue to follow and trend Co2 per ABG  Ceribell ordered   Hypernatremia  Hypomagnesemia  Hypophosphatemia  Na 149 from 153, trending down  K 3.2, Mag 1.5, Phos 1.4 Magnesium, Potassium, Phos replaced per elink  P:  Continue free water flushes q4hrs  Repeat BMET around 1600   Failure to Thrive Weight loss/Malnutrition  BMI 19.19 30-40lb wt loss since 2/24  P: Consult RD Will need to initiate tube feeds  HTN  P:  Continue prn hydralazine  Hold home atenolol   GERD  P:  Change pepcid to protonix IV BID    Best Practice (right click and "Reselect all SmartList Selections" daily)   Diet/type: NPO DVT prophylaxis: systemic heparin Pressure ulcer(s). None present GI prophylaxis: h2blocker  Lines: N/A Foley:  Yes, and it is still needed Code Status:  full code Last date of multidisciplinary goals of care discussion [updated husband and daughter at beside 11/21]     Critical care time: 45 mins

## 2023-03-09 NOTE — Progress Notes (Addendum)
eLink Physician-Brief Progress Note Patient Name: Karen [name] DOB: 1950-11-24 MRN: [id number]   Date of Service  03/09/2023  HPI/Events of Note  72 year old female with HTN, hypothyroidism, MDD with recent weight loss who presented to endoscopy suite for planned colonoscopy with altered mental status and found with hypoxemia in 70-80s by EMS.  Anticoagulated for PE.  Status post Tylenol.  Persistent fever at 38.4.  Retained renal function.   eICU Interventions  Apply cooling blanket.  Not a great candidate for NSAIDs in the setting of anticoagulation.  Too early for additional dose of Tylenol.   0125 -renew restraints  Intervention Category Minor Interventions: Routine modifications to care plan (e.g. PRN medications for pain, fever)  Karen [name] 03/09/2023, 8:17 PM

## 2023-03-09 NOTE — Progress Notes (Signed)
PHARMACY - ANTICOAGULATION CONSULT NOTE  Pharmacy Consult for heparin Indication: pulmonary embolus  Allergies  Allergen Reactions   Zoloft [Sertraline Hcl] Anxiety and Other (See Comments)    "crazy feeling"    Patient Measurements: Height: 5\' 4"  (162.6 cm) Weight: 50.7 kg (111 lb 12.4 oz) IBW/kg (Calculated) : 54.7 Heparin Dosing Weight: TBW  Vital Signs: Temp: 100.9 F (38.3 C) (11/21 2030) Temp Source: Bladder (11/21 1600) BP: 87/53 (11/21 2030) Pulse Rate: 118 (11/21 2015)  Labs: Recent Labs    03/08/23 1242 03/08/23 1430 03/08/23 2138 03/08/23 2332 03/09/23 0243 03/09/23 0854 03/09/23 1545 03/09/23 2041  HGB 13.2  --   --   --  10.1*  --   --   --   HCT 45.1  --   --   --  34.5*  --   --   --   PLT 237  --   --   --  191  --   --   --   APTT 27  --   --   --   --   --   --   --   LABPROT 13.9  --   --   --   --   --   --   --   INR 1.1  --   --   --   --   --   --   --   HEPARINUNFRC  --   --   --   --  0.86* >1.10*  --  0.90*  CREATININE  --  0.52  --   --  0.49  --  0.46  --   TROPONINIHS  --   --  32* 32*  --   --   --   --     Estimated Creatinine Clearance: 52.4 mL/min (by C-G formula based on SCr of 0.46 mg/dL).   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 Three variants of uncertain significance were detected - one in the AXIN2 gene called c.1829G>A, one in the PMS2 gene called c.964G>A, and one in the RNF43 gene  called c.1114C>T. The report date is 06/24/2019.     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   Tick bite 08/2015   UTI (urinary tract infection) 10/2015   Assessment: 72 YO female presenting for a colonoscopy procedure 11/20 with SpO2 70-80% and slight shortness of breath. She was transferred to the [facility] ED where CTA PE revealed scattered subsegmental pulmonary emboli throughout the right lung, no RHS. She was intubated in the ED due to lack of improvement on BiPAP. No anticoagulation noted  PTA. Pharmacy consulted for heparin dosing.   Today, 03/09/23, PM: Heparin level still SUPRAtherapeutic after heparin rate decreased from 950 to 750 units/hr Per RN, No s/s bleeding reported.  No IV site issues or complications.     Goal of Therapy:  Heparin level 0.3-0.7 units/ml Monitor platelets by anticoagulation protocol: Yes   Plan:  Reduce IV heparin from 750 to 550 units/hr Recheck heparin level 8 hours after rate reduction Daily heparin level and CBC   Hessie Knows, PharmD, BCPS Secure Chat if ?s 03/09/2023 9:06 PM

## 2023-03-09 NOTE — Progress Notes (Signed)
Initial Nutrition Assessment  DOCUMENTATION CODES:   Non-severe (moderate) malnutrition in context of chronic illness  INTERVENTION:  - Per CCM, plan to start tube feeds today.  Initiate tube feeding via OGT (tip in stomach per abd xray): Vital 1.2 at 50 ml/h (1200 ml per day) *Start at 38mL/hr and advance by 10mL Q12H Provides 1440 kcal, 90 gm protein, 973 ml free water daily  - Monitor magnesium, potassium, and phosphorus BID for at least 3 days, MD to replete as needed, as pt is at risk for refeeding syndrome given malnutrition with significant weight loss and inadequate oral intake PTA. - 100mg  thiamine x5 days due to risk of refeeding.   - FWF per CCM/MD. Currently ordered Q4H which in addition to TF will provide 2737mL/day  - Monitor weight trends.   NUTRITION DIAGNOSIS:   Moderate Malnutrition related to chronic illness as evidenced by mild fat depletion, moderate muscle depletion, percent weight loss (23% in 9 months).  GOAL:   Patient will meet greater than or equal to 90% of their needs  MONITOR:   Vent status, Labs, Weight trends, TF tolerance ad REASON FOR ASSESSMENT:   Consult Enteral/tube feeding initiation and management  ASSESSMENT:   72 yr old female with PMH significant of HTN, Hypothyroidism, MDD, cognitive impairment, arthritis who presented with AMS and hypoxia from [facility] after being scheduled for a colonoscopy.   11/20 Admit; Intubated  Patient intubated and sedated at time of visit. Daughters at bedside provided nutrition history.   UBW reported to be 180# and they state patient has been losing weight for just over a year.  Only weight history this year starts in March. From March to now patient has lost 33# or 23% body weight. This is severe and significant for a time frame of 9 months. Daughters confirm this to be accurate.   In the past, patient used to eat 3 meals a day plus snacks. Over the past few months, her  intake has declined and she has only been eating 2 very small meals a day. Daughter has been able to get her to drink Ensure twice a day and she has also been taking Pro-Stat daily.   OGT placed and xray verified in the stomach. Per CCM, plan to start tube feeds today.  Suspect patient at high risk for refeeding syndrome, recommend slow advancement of tube feeds with close monitoring electrolytes.  Per discussion with CCM NP, can advance towards goal but will plan for slow titration.    Medications reviewed and include: Q4H FWF Levophed @ 32mcg/min Propofol @ 8.43mL/hr  Labs reviewed:  Na 149 K+ 3.2 Phosphorus 1.1   NUTRITION - FOCUSED PHYSICAL EXAM:  Flowsheet Row Most Recent Value  Orbital Region Mild depletion  Upper Arm Region Mild depletion  Thoracic and Lumbar Region Mild depletion  Buccal Region Unable to assess  Temple Region No depletion  Clavicle Bone Region Moderate depletion  Clavicle and Acromion Bone Region Moderate depletion  Scapular Bone Region Unable to assess  Dorsal Hand Mild depletion  Patellar Region Mild depletion  Anterior Thigh Region Mild depletion  Posterior Calf Region No depletion  Edema (RD Assessment) None  Hair Reviewed  Eyes Unable to assess  Mouth Unable to assess  Skin Reviewed  Nails Reviewed       Diet Order:   Diet Order             Diet NPO time specified  Diet effective now  EDUCATION NEEDS:  Education needs have been addressed  Skin:  Skin Assessment: Reviewed RN Assessment  Last BM:  PTA  Height:  Ht Readings from Last 1 Encounters:  03/09/23 5\' 4"  (1.626 m)   Weight:  Wt Readings from Last 1 Encounters:  03/09/23 50.7 kg    BMI:  Body mass index is 19.19 kg/m.  Estimated Nutritional Needs:  Kcal:  1400-1600 kcals Protein:  75-90 grams Fluid:  >/= 1.5L    Shelle Iron RD, LDN For contact information, refer to [facility].

## 2023-03-09 NOTE — Progress Notes (Signed)
Notified Lab that ABG being sent for analysis. 

## 2023-03-10 ENCOUNTER — Inpatient Hospital Stay (HOSPITAL_COMMUNITY): Payer: Medicare HMO

## 2023-03-10 DIAGNOSIS — R569 Unspecified convulsions: Secondary | ICD-10-CM | POA: Diagnosis not present

## 2023-03-10 DIAGNOSIS — E440 Moderate protein-calorie malnutrition: Secondary | ICD-10-CM | POA: Diagnosis present

## 2023-03-10 DIAGNOSIS — J9601 Acute respiratory failure with hypoxia: Secondary | ICD-10-CM | POA: Diagnosis not present

## 2023-03-10 DIAGNOSIS — G9340 Encephalopathy, unspecified: Secondary | ICD-10-CM | POA: Diagnosis not present

## 2023-03-10 DIAGNOSIS — I2699 Other pulmonary embolism without acute cor pulmonale: Secondary | ICD-10-CM | POA: Diagnosis not present

## 2023-03-10 DIAGNOSIS — J9602 Acute respiratory failure with hypercapnia: Secondary | ICD-10-CM | POA: Diagnosis not present

## 2023-03-10 LAB — BLOOD GAS, ARTERIAL
Acid-Base Excess: 6.4 mmol/L — ABNORMAL HIGH (ref 0.0–2.0)
Bicarbonate: 31.3 mmol/L — ABNORMAL HIGH (ref 20.0–28.0)
Drawn by: 25770
FIO2: 50 %
MECHVT: 330 mL
O2 Saturation: 99 %
PEEP: 5 cmH2O
Patient temperature: 38.3
RATE: 14 {breaths}/min
pCO2 arterial: 48 mm[Hg] (ref 32–48)
pH, Arterial: 7.43 (ref 7.35–7.45)
pO2, Arterial: 170 mm[Hg] — ABNORMAL HIGH (ref 83–108)

## 2023-03-10 LAB — MENINGITIS/ENCEPHALITIS PANEL (CSF)

## 2023-03-10 LAB — CBC
HCT: 38.7 % (ref 36.0–46.0)
Hemoglobin: 10.9 g/dL — ABNORMAL LOW (ref 12.0–15.0)
MCH: 28.6 pg (ref 26.0–34.0)
MCHC: 28.2 g/dL — ABNORMAL LOW (ref 30.0–36.0)
MCV: 101.6 fL — ABNORMAL HIGH (ref 80.0–100.0)
Platelets: 174 10*3/uL (ref 150–400)
RBC: 3.81 MIL/uL — ABNORMAL LOW (ref 3.87–5.11)
RDW: 13.5 % (ref 11.5–15.5)
WBC: 10.7 10*3/uL — ABNORMAL HIGH (ref 4.0–10.5)
nRBC: 0 % (ref 0.0–0.2)

## 2023-03-10 LAB — PHOSPHORUS
Phosphorus: 3.5 mg/dL (ref 2.5–4.6)
Phosphorus: 2.9 mg/dL (ref 2.5–4.6)

## 2023-03-10 LAB — HEPARIN LEVEL (UNFRACTIONATED)
Heparin Unfractionated: 0.47 [IU]/mL (ref 0.30–0.70)
Heparin Unfractionated: 0.17 [IU]/mL — ABNORMAL LOW (ref 0.30–0.70)

## 2023-03-10 LAB — PROTEIN AND GLUCOSE, CSF
Glucose, CSF: 72 mg/dL — ABNORMAL HIGH (ref 40–70)
Total  Protein, CSF: 48 mg/dL — ABNORMAL HIGH (ref 15–45)

## 2023-03-10 LAB — GLUCOSE, CAPILLARY
Glucose-Capillary: 105 mg/dL — ABNORMAL HIGH (ref 70–99)
Glucose-Capillary: 105 mg/dL — ABNORMAL HIGH (ref 70–99)
Glucose-Capillary: 76 mg/dL (ref 70–99)
Glucose-Capillary: 82 mg/dL (ref 70–99)
Glucose-Capillary: 84 mg/dL (ref 70–99)
Glucose-Capillary: 97 mg/dL (ref 70–99)

## 2023-03-10 LAB — MAGNESIUM
Magnesium: 1.7 mg/dL (ref 1.7–2.4)
Magnesium: 1.6 mg/dL — ABNORMAL LOW (ref 1.7–2.4)

## 2023-03-10 LAB — ABO/RH: ABO/RH(D): AB POS

## 2023-03-10 LAB — T3 UPTAKE: T3 Uptake Ratio: 34 % (ref 24–39)

## 2023-03-10 MED ORDER — LIDOCAINE 1 % OPTIME INJ - NO CHARGE
5.0000 mL | Freq: Once | INTRAMUSCULAR | Status: AC
  Administered 2023-03-10: 2.5 mL via INTRADERMAL

## 2023-03-10 MED ORDER — ATENOLOL 25 MG PO TABS: 25.0000 mg | ORAL_TABLET | Freq: Two times a day (BID) | ORAL | Status: DC

## 2023-03-10 MED ORDER — PROPOFOL BOLUS VIA INFUSION
10.0000 mg | Freq: Once | INTRAVENOUS | Status: AC
  Administered 2023-03-10: 10 mg via INTRAVENOUS

## 2023-03-10 MED ORDER — DEXTROSE 5 % IV SOLN
500.0000 mg | Freq: Three times a day (TID) | INTRAVENOUS | Status: DC
  Administered 2023-03-10 – 2023-03-11 (×3): 500 mg via INTRAVENOUS
  Filled 2023-03-10 (×5): qty 10

## 2023-03-10 MED ORDER — VANCOMYCIN HCL IN DEXTROSE 1-5 GM/200ML-% IV SOLN
1000.0000 mg | Freq: Once | INTRAVENOUS | Status: AC
  Administered 2023-03-10: 1000 mg via INTRAVENOUS
  Filled 2023-03-10: qty 200

## 2023-03-10 MED ORDER — SODIUM CHLORIDE 0.9 % IV SOLN: 250.0000 mL | INTRAVENOUS | Status: AC

## 2023-03-10 MED ORDER — MIDAZOLAM HCL 2 MG/2ML IJ SOLN
1.0000 mg | INTRAMUSCULAR | Status: DC | PRN
Start: 2023-03-10 — End: 2023-03-19
  Administered 2023-03-10 – 2023-03-15 (×11): 2 mg via INTRAVENOUS
  Administered 2023-03-15: 1 mg via INTRAVENOUS
  Administered 2023-03-15 – 2023-03-19 (×14): 2 mg via INTRAVENOUS
  Administered 2023-03-19: 1 mg via INTRAVENOUS
  Filled 2023-03-10 (×32): qty 2

## 2023-03-10 MED ORDER — SODIUM CHLORIDE 0.9 % IV SOLN
2.0000 g | Freq: Two times a day (BID) | INTRAVENOUS | Status: DC
  Administered 2023-03-10 – 2023-03-12 (×4): 2 g via INTRAVENOUS
  Filled 2023-03-10 (×4): qty 20

## 2023-03-10 MED ORDER — VANCOMYCIN HCL 500 MG/100ML IV SOLN
500.0000 mg | Freq: Two times a day (BID) | INTRAVENOUS | Status: DC
  Administered 2023-03-11 – 2023-03-13 (×5): 500 mg via INTRAVENOUS
  Filled 2023-03-10 (×5): qty 100

## 2023-03-10 MED ORDER — NOREPINEPHRINE 4 MG/250ML-% IV SOLN
1.0000 ug/min | INTRAVENOUS | Status: DC
  Administered 2023-03-10: 5 ug/min via INTRAVENOUS
  Administered 2023-03-11 (×2): 7 ug/min via INTRAVENOUS
  Administered 2023-03-11: 6 ug/min via INTRAVENOUS
  Administered 2023-03-12: 9 ug/min via INTRAVENOUS
  Administered 2023-03-12: 3 ug/min via INTRAVENOUS
  Filled 2023-03-10 (×6): qty 250

## 2023-03-10 MED ORDER — SODIUM CHLORIDE 0.9 % IV SOLN
2.0000 g | INTRAVENOUS | Status: DC
  Administered 2023-03-10 – 2023-03-11 (×5): 2 g via INTRAVENOUS
  Filled 2023-03-10 (×7): qty 2000

## 2023-03-10 MED ORDER — LIDOCAINE HCL (PF) 1 % IJ SOLN
INTRAMUSCULAR | Status: AC
  Filled 2023-03-10: qty 5

## 2023-03-10 MED ORDER — PIVOT 1.5 CAL PO LIQD
1000.0000 mL | ORAL | Status: DC
  Filled 2023-03-10: qty 1000

## 2023-03-10 MED ORDER — SODIUM CHLORIDE 0.9 % IV SOLN: INTRAVENOUS | Status: DC

## 2023-03-10 MED ORDER — ATENOLOL 25 MG PO TABS
25.0000 mg | ORAL_TABLET | Freq: Two times a day (BID) | ORAL | Status: DC
  Administered 2023-03-10 – 2023-03-12 (×5): 25 mg
  Filled 2023-03-10 (×7): qty 1

## 2023-03-10 MED ORDER — NOREPINEPHRINE 4 MG/250ML-% IV SOLN: 2.0000 ug/min | INTRAVENOUS | Status: DC

## 2023-03-10 MED ORDER — ROCURONIUM BROMIDE 10 MG/ML (PF) SYRINGE
1.0000 mg/kg | PREFILLED_SYRINGE | Freq: Once | INTRAVENOUS | Status: DC
Start: 2023-03-10 — End: 2023-03-10
  Filled 2023-03-10: qty 10

## 2023-03-10 MED ORDER — VITAL AF 1.2 CAL PO LIQD
1000.0000 mL | ORAL | Status: DC
  Administered 2023-03-11 – 2023-03-17 (×7): 1000 mL

## 2023-03-10 MED ORDER — LACTATED RINGERS IV BOLUS: 500.0000 mL | Freq: Once | INTRAVENOUS | Status: DC

## 2023-03-10 MED ORDER — DEXTROSE 5 % IV SOLN
10.0000 mg/kg | Freq: Three times a day (TID) | INTRAVENOUS | Status: DC
  Filled 2023-03-10: qty 10.1

## 2023-03-10 MED ORDER — PROSOURCE TF20 ENFIT COMPATIBL EN LIQD
60.0000 mL | Freq: Every day | ENTERAL | Status: DC
  Administered 2023-03-10: 60 mL
  Filled 2023-03-10: qty 60

## 2023-03-10 NOTE — Procedures (Signed)
PCCM Procedure   Attempted bedside LP for rule out of meningitis, tap was unsuccessful. Will ask assistance of IR to help obtain cerebral spine fluid sample.   Will not delay initiation of antibiotics as risk outweighs benefit.    Karen Gemme D. Harris, NP-C [facility] Personal contact information can be found on Amion  If no contact or response made please call [phone] 03/10/2023, 1:21 PM

## 2023-03-10 NOTE — Progress Notes (Signed)
PHARMACY - ANTICOAGULATION CONSULT NOTE  Pharmacy Consult for heparin Indication: pulmonary embolus  Allergies  Allergen Reactions   Zoloft [Sertraline Hcl] Anxiety and Other (See Comments)    "crazy feeling"    Patient Measurements: Height: 5\' 4"  (162.6 cm) Weight: 50.7 kg (111 lb 12.4 oz) IBW/kg (Calculated) : 54.7 Heparin Dosing Weight: TBW  Vital Signs: Temp: 99.9 F (37.7 C) (11/22 0739) BP: 136/69 (11/22 0715) Pulse Rate: 124 (11/22 0715)  Labs: Recent Labs    03/08/23 1242 03/08/23 1242 03/08/23 1430 03/08/23 2138 03/08/23 2332 03/09/23 0243 03/09/23 0854 03/09/23 1545 03/09/23 2041 03/10/23 0708  HGB 13.2  --   --   --   --  10.1*  --   --   --  10.9*  HCT 45.1  --   --   --   --  34.5*  --   --   --  38.7  PLT 237  --   --   --   --  191  --   --   --  174  APTT 27  --   --   --   --   --   --   --   --   --   LABPROT 13.9  --   --   --   --   --   --   --   --   --   INR 1.1  --   --   --   --   --   --   --   --   --   HEPARINUNFRC  --    < >  --   --   --  0.86* >1.10*  --  0.90* 0.47  CREATININE  --   --  0.52  --   --  0.49  --  0.46  --   --   TROPONINIHS  --   --   --  32* 32*  --   --   --   --   --    < > = values in this interval not displayed.    Estimated Creatinine Clearance: 52.4 mL/min (by C-G formula based on SCr of 0.46 mg/dL).   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 Three variants of uncertain  significance were detected - one in the AXIN2 gene called c.1829G>A, one in the PMS2 gene called c.964G>A, and one in the RNF43 gene called c.1114C>T. The report date is 06/24/2019.     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 She was transferred to the [facility] ED where CTA PE revealed scattered subsegmental pulmonary emboli throughout the right lung, no RHS. She was intubated in the ED due to lack of improvement on BiPAP. No anticoagulation noted PTA. Pharmacy consulted for heparin dosing.   Today, 03/10/23, PM: Heparin level now therapeutic at 0.47 Hgb 10.9--stable, improved. Plts 174--stable. No s/sx of bleeding reported  Goal of Therapy:  Heparin level 0.3-0.7 units/ml Monitor platelets by anticoagulation protocol: Yes   Plan:  Continue IV heparin at 550 units/hr Recheck confirmatory heparin level in 8hrs Daily heparin level and CBC Continue to monitor for s/sx of bleeding   Cherylin Mylar, PharmD Clinical Pharmacist  11/22/20248:20 AM

## 2023-03-10 NOTE — Progress Notes (Addendum)
PHARMACY - ANTICOAGULATION CONSULT NOTE  Pharmacy Consult for heparin Indication: pulmonary embolus  Allergies  Allergen Reactions   Zoloft [Sertraline Hcl] Anxiety and Other (See Comments)    "crazy feeling"    Patient Measurements: Height: 5\' 4"  (162.6 cm) Weight: 50.7 kg (111 lb 12.4 oz) IBW/kg (Calculated) : 54.7 Heparin Dosing Weight: TBW  Vital Signs: Temp: 99.9 F (37.7 C) (11/22 1730) Temp Source: Bladder (11/22 1200) BP: 135/72 (11/22 1000) Pulse Rate: 91 (11/22 1645)  Labs: Recent Labs    03/08/23 1242 03/08/23 1242 03/08/23 1430 03/08/23 2138 03/08/23 2332 03/09/23 0243 03/09/23 0854 03/09/23 1545 03/09/23 2041 03/10/23 0708  HGB 13.2  --   --   --   --  10.1*  --   --   --  10.9*  HCT 45.1  --   --   --   --  34.5*  --   --   --  38.7  PLT 237  --   --   --   --  191  --   --   --  174  APTT 27  --   --   --   --   --   --   --   --   --   LABPROT 13.9  --   --   --   --   --   --   --   --   --   INR 1.1  --   --   --   --   --   --   --   --   --   HEPARINUNFRC  --    < >  --   --   --  0.86* >1.10*  --  0.90* 0.47  CREATININE  --   --  0.52  --   --  0.49  --  0.46  --   --   TROPONINIHS  --   --   --  32* 32*  --   --   --   --   --    < > = values in this interval not displayed.    Estimated Creatinine Clearance: 52.4 mL/min (by C-G formula based on SCr of 0.46 mg/dL).   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 Three variants of uncertain significance were detected - one in the AXIN2 gene called c.1829G>A, one in the PMS2 gene called c.964G>A, and one in the RNF43 gene called c.1114C>T. The report date is 06/24/2019.     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 She was transferred to the [facility] ED where CTA PE revealed scattered subsegmental pulmonary emboli throughout the right lung, no RHS. She was intubated in the ED due to lack of improvement on BiPAP. No anticoagulation noted PTA. Pharmacy consulted for heparin dosing.   Today, 03/10/23, PM: HL is low at 0.17 , subtherapeutic Hgb 10.9--stable, improved. Plts 174--stable. No s/sx of bleeding reported per RN    Goal of Therapy:  Heparin level 0.3-0.7 units/ml Monitor platelets by anticoagulation protocol: Yes   Plan:  Increase  IV heparin to 600 units/hr Recheck heparin level in 8hrs Daily heparin level and CBC Continue to monitor for s/sx of bleeding   Adalberto Cole, PharmD, BCPS 03/10/2023 6:09 PM

## 2023-03-10 NOTE — Procedures (Signed)
PROCEDURE SUMMARY:  Successful fluoro-guided lumbar puncture at L5-S1.  Did require attempt at multiple levels before ultimate success in removing 10 mL clear, colorless fluid.   No immediate complications.  Pt tolerated well.   Specimen was sent for labs.  EBL < 5mL  Hoyt Koch PA-C 03/10/2023 4:45 PM

## 2023-03-10 NOTE — Procedures (Signed)
Arterial Catheter Insertion Procedure Note  Sophy Berndt  [id number]  February 12, 1951  Date:03/10/23  Time:10:34 AM    Provider Performing: Alphonzo Lemmings D. Harris    Procedure: Insertion of Arterial Line (36644) with US guidance (03474)   Indication(s) Blood pressure monitoring and/or need for frequent ABGs  Consent Risks of the procedure as well as the alternatives and risks of each were explained to the patient and/or caregiver.  Consent for the procedure was obtained and is signed in the bedside chart  Anesthesia None   Time Out Verified patient identification, verified procedure, site/side was marked, verified correct patient position, special equipment/implants available, medications/allergies/relevant history reviewed, required imaging and test results available.   Sterile Technique Maximal sterile technique including full sterile barrier drape, hand hygiene, sterile gown, sterile gloves, mask, hair covering, sterile ultrasound probe cover (if used).   Procedure Description Area of catheter insertion was cleaned with chlorhexidine and draped in sterile fashion. With real-time ultrasound guidance an arterial catheter was placed into the right radial artery.  Appropriate arterial tracings confirmed on monitor.     Complications/Tolerance None; patient tolerated the procedure well.   EBL Minimal   Specimen(s) None   Sumner Kirchman D. Harris, NP-C [facility] Personal contact information can be found on Amion  If no contact or response made please call [phone] 03/10/2023, 10:35 AM

## 2023-03-10 NOTE — Procedures (Signed)
Patient Name: Karen [name]  MRN: [id number]  Epilepsy Attending: Charlsie Quest  Referring Physician/Provider: Henrene Hawking, NP  Date: 03/09/2023  Duration: 4 hours 10 mins  Patient history: 72 year old female with episodes of upward gaze deviation and tremors.  Rapid EEG evaluate for seizures.  Level of alertness: comatose  AEDs during EEG study: Propofol  Technical aspects: This EEG was obtained using a 10 lead EEG system positioned circumferentially without any parasagittal coverage (rapid EEG). Computer selected EEG is reviewed as  well as background features and all clinically significant events.  Description: EEG showed continuous generalized predominantly 5-7 theta slowing admixed with intermittent generalized 2 to 3 Hz delta slowing.  Hyperventilation and photic stimulation were not performed.     ABNORMALITY - Continuous slow, generalized  IMPRESSION: This limited ceribell EEG is suggestive of moderate to severe diffuse encephalopathy.  No seizures or epileptiform discharges were seen throughout the recording.  Suspicion for epileptic interictal activity persist, please consider conventional EEG.  Connee Ikner Annabelle Harman

## 2023-03-10 NOTE — Progress Notes (Signed)
Notified Lab that ABG being sent for analysis. 

## 2023-03-10 NOTE — Progress Notes (Signed)
Pharmacy Antibiotic Note  Karen [name] is a 72 y.o. female admitted on 03/08/2023 with  possible meningitis . Pharmacy has been consulted for acyclovir and vancomycin dosing.  WBC 6.4 >> 10.7 since admission and patient is now fevering with last two recorded temperatures 100.9 and HR up to 166. Patient has also experienced multiple episodes of upward gaze deviation and tremors--ceribell EEG showing moderate-severe diffuse encephalopathy with no seizures documented during the read. Blood cultures from 11/20 have remained no growth. CCM anticipating CNS infection, pending lumbar puncture by IR. Will start empiric antibiotics with bacterial and viral coverage, per CCM.  Plan: Give IV vancomycin 1000mg  x1, followed by IV vancomycin 500mg  q12hrs (per vancomycin nomogram). Goal trough 15-20. Start IV ampicillin 2g q4hrs  Start IV ceftriaxone 2g q12hrs  Start IV acyclovir 10mg /kg q8hrs  Start normal saline @125ml /hr for prevention of renal toxicity with IV acyclovir  Monitor BMP x3 days to evaluate for renal toxicity with IV acyclovir  Vanc levels PRN F/u lumbar puncture, renal function, overall clinical picture De-escalate antibiotics as able   Height: 5\' 4"  (162.6 cm) Weight: 50.7 kg (111 lb 12.4 oz) IBW/kg (Calculated) : 54.7  Temp (24hrs), Avg:99.9 F (37.7 C), Min:97.7 F (36.5 C), Max:101.1 F (38.4 C)  Recent Labs  Lab 03/08/23 1242 03/08/23 1430 03/08/23 1445 03/08/23 1858 03/08/23 2151 03/09/23 0243 03/09/23 1545 03/10/23 0708  WBC 6.4  --   --   --   --  9.3  --  10.7*  CREATININE  --  0.52  --   --   --  0.49 0.46  --   LATICACIDVEN  --   --  0.7 0.9 2.0*  --   --   --     Estimated Creatinine Clearance: 52.4 mL/min (by C-G formula based on SCr of 0.46 mg/dL).    Allergies  Allergen Reactions   Zoloft [Sertraline Hcl] Anxiety and Other (See Comments)    "crazy feeling"    Antimicrobials this admission: 11/22 vancomycin >>  11/22 ampicillin >>  11/22  ceftriaxone >> 11/22 acyclovir >>  Dose adjustments this admission: N/A  Microbiology results: 11/20 BCx: NG x2 days 11/20 resp panel: negative 11/21 MRSA PCR: negative   Thank you for allowing pharmacy to be a part of this patient's care.  Cherylin Mylar, PharmD Clinical Pharmacist  11/22/20241:37 PM

## 2023-03-10 NOTE — Progress Notes (Addendum)
NAME:  Karen [name], MRN:  [id number], DOB:  1950/05/16, LOS: 2 ADMISSION DATE:  03/08/2023, CONSULTATION DATE:  03/08/23  REFERRING MD: MD Halivand CHIEF COMPLAINT:  AMS, hypoxia   History of Present Illness:  Pt is a 72 yr old female with a significant pmhx of HTN, Hypothyroidism, Major Depressive Disorder, cognitive impairment, arthritis, hearing loss, 30-40lb unintentional weight loss since 05/2022. Per patient's husband and daughter endorse that patient has had a functional and cognitive decline since the beginning of the 2024. Patient has had an extensive workup in regards to cognitive impairment and unintentional weight loss with MRI Brain, MRCPs, CT of chest and abdomen with no definitive etiology.   Patient presented to [facility] ED with AMS and hypoxia (O2 Sats in 70s-80s) by EMS from [facility], who was scheduled for a colonoscopy on 11/20. Patient did not receive colonoscopy due to hypoxia and AMS. Initial VBG revealed pH 7.17 and pCO2 > 123. Patient was given narcan during the initial ED workup with no response and subsequently placed on BIPAP due to increasing hypoxia and respiratory distress. Repeat VBG showed no improvement and patient was intubated due to hypoxia/hypercarbia. Also, CT of chest obtained by EDP showed evidence of scattered subsegmental PE and PCCM was consulted for further vent management as well as management for PE.   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   Significant Hospital Events: Including procedures, antibiotic start and stop dates in addition to other pertinent events   11/20 Admit with AMS, acute hypoxic/hypercarbic respiratory failure, subsegmental PE intubated PCCM consult 11/21 Intubated, sedated, tremors  11/22 Tachycardiac this am with rate in the mid 140, shivering/tremors seen with physical touch   Interim History / Subjective:  Sedated on vent   Objective   Blood pressure 136/69, pulse (!) 124, temperature 99.9 F (37.7 C), resp. rate (!) 21, height 5\' 4"  (1.626 m), weight 50.7 kg, SpO2 94%.    Vent Mode: PRVC FiO2 (%):  [30 %-50 %] 50 % Set Rate:  [12 bmp-14 bmp] 14 bmp Vt Set:  [330 mL] 330 mL PEEP:  [5 cmH20] 5 cmH20 Plateau Pressure:  [12 cmH20-17 cmH20] 17 cmH20   Intake/Output Summary (Last 24 hours) at 03/10/2023 4098 Last data filed at 03/10/2023 0700 Gross per 24 hour  Intake 2412.17 ml  Output 825 ml  Net 1587.17 ml   Filed Weights   03/08/23 2100 03/09/23 0500  Weight: 55.3 kg 50.7 kg    Examination: General: Acute on chronically ill appearing deconditioned elderly female lying in bed on mechanical ventilation, in NAD HEENT: ETT, MM pink/moist, PERRL,  Neuro: Unable to follow commands, shivering/tremors seen with physical touch CV: Tachycardia, no murmur, rubs, or gallops,  PULM:  Clear to auscultation, no increased work of breathing, no added breath sounds GI: soft, bowel sounds active in all 4 quadrants, non-tender, non-distended, tolerating TF Extremities: warm/dry, no edema  Skin: no rashes or lesions  Resolved Hospital Problem list   Hypernatremia  Hypomagnesemia  Hypophosphatemia  Assessment & Plan:  Acute hypoxemic/hypercarbic respiratory failure  Subsegmental PEs  -No clear etiology to  determine cause of hypercarbia, PFTs (2019) within normal limits. Possible hypoventilation from pleurisy from PE?  P:  Continue ventilator support with lung protective strategies  Wean PEEP and FiO2 for sats greater than 90%. Head of bed elevated 30 degrees. Plateau pressures less than 30 cm H20.  Follow intermittent chest x-ray and ABG.   SAT/SBT as tolerated, mentation preclude extubation  Ensure adequate pulmonary hygiene  Follow cultures  VAP bundle in place  PAD protocol, wean as able  Place A-line to trend ABGs Continue Heparin drip  Ongoing neuro workup as below   Acute Encephalopathy Progressive weakness with lower extremity tremors  -In the setting of hypercarbia but recent history of cognitive impairment with memory loss and lower extremity weakness with some tremors per family    -TSH 1.145, T4 1.13 -Spot EEG negative  P:  Consider neuro consult  Consider repeat MRI and/or LP Maintain neuro protective measures; goal for eurothermia, euglycemia, eunatermia, normoxia, and PCO2 goal of 35-40 Nutrition and bowel regiment  Seizure precautions  Aspirations precautions   Failure to Thrive Weight loss/Malnutrition  -BMI 19.19, 30-40lb wt loss since 2/24  P: RD following appreciate assistance   HTN  P:  Continuous telemetry  Continue as needed hydralazine   GERD  P:  Continue PPI   Moderate protein calorie malnutrition -As evidence by mild fat depletion, moderate muscle depletion and recent weight loss P: Continue Tube feeds  Protein supplementation  RD following   Intermittent hypoglycemia  P: Start tube feeds as above  CBG checks q4  Best Practice (right click and "Reselect all SmartList Selections" daily)   Diet/type: NPO DVT prophylaxis: systemic heparin Pressure ulcer(s). None present GI prophylaxis: h2blocker  Lines: N/A Foley:  Yes, and it is still needed Code Status:  full code Last date of multidisciplinary goals of care discussion: Ongoing  aggressive care, update family daily   Critical care time:   CRITICAL CARE Performed by: Damondre Pfeifle D. Harris   Total critical care time: 42 minutes  Critical care time was exclusive of separately billable procedures and treating other patients.  Critical care was necessary to treat or prevent imminent or life-threatening deterioration.  Critical care was time spent personally by me on the following activities: development of treatment plan with patient and/or surrogate as well as nursing, discussions with consultants, evaluation of patient's response to treatment, examination of patient, obtaining history from patient or surrogate, ordering and performing treatments and interventions, ordering and review of laboratory studies, ordering and review of radiographic studies, pulse oximetry and re-evaluation of patient's condition.  Shondrea Steinert D. Harris, NP-C [facility] Personal contact information can be found on Amion  If no contact or response made please call [phone] 03/10/2023, 9:28 AM

## 2023-03-10 NOTE — Progress Notes (Addendum)
Nutrition Follow-up  DOCUMENTATION CODES:   Non-severe (moderate) malnutrition in context of chronic illness  INTERVENTION:   Continue to monitor magnesium, potassium, and phosphorus BID for at least 3 days, MD to replete as needed, as pt is at risk for refeeding syndrome given malnutrition with significant weight loss and inadequate oral intake PTA. - 100mg  thiamine x5 days due to risk of refeeding.    -Continue Vital AF 1.2, advancing to 30 ml/hr now, continue to advance 10 ml every 12 hours to goal of 55 ml/hr via OGT. -Provides 1584 kcal, 99 gm protein, 1070 ml free water daily    NUTRITION DIAGNOSIS:   Moderate Malnutrition related to chronic illness as evidenced by mild fat depletion, moderate muscle depletion, percent weight loss (23% in 9 months).  Ongoing.  GOAL:   Patient will meet greater than or equal to 90% of their needs  Progressing with TF  MONITOR:   Vent status, Labs, Weight trends, TF tolerance  REASON FOR ASSESSMENT:   Consult Enteral/tube feeding initiation and management  ASSESSMENT:   72 yr old female with PMH significant of HTN, Hypothyroidism, MDD, cognitive impairment, arthritis who presented with AMS and hypoxia from [facility] after being scheduled for a colonoscopy.  11/20 Admit; Intubated   Family at bedside, pt's Vital AF 1.2 @ 20 ml/hr. RN to go ahead and advance to 30 ml/hr. Mg/Phos labs WNL. Should be at goal starting tomorrow with advancement. RN provided Prosource today to provide additional protein while advancement still ongoing. Once at goal, won't need prosource.   Patient is currently intubated on ventilator support MV: 4.6 L/min Temp (24hrs), Avg:99.9 F (37.7 C), Min:97.7 F (36.5 C), Max:101.1 F (38.4 C)   Admission weight: 122 lbs Current weight: 111 lbs  Medications: Thiamine  Labs reviewed: CBGs: 74-122   Diet Order:   Diet Order             Diet NPO time specified  Diet effective now                    EDUCATION NEEDS:   Education needs have been addressed  Skin:  Skin Assessment: Reviewed RN Assessment  Last BM:  PTA  Height:   Ht Readings from Last 1 Encounters:  03/09/23 5\' 4"  (1.626 m)    Weight:   Wt Readings from Last 1 Encounters:  03/09/23 50.7 kg    BMI:  Body mass index is 19.19 kg/m.  Estimated Nutritional Needs:   Kcal:  1550-1750  Protein:  85-100g  Fluid:  1.7L/day   Tilda Franco, MS, RD, LDN Inpatient Clinical Dietitian Contact information available via Amion

## 2023-03-10 NOTE — Progress Notes (Signed)
Assisted with transporting PT on vent (100% fi02) to IR.

## 2023-03-11 ENCOUNTER — Inpatient Hospital Stay (HOSPITAL_COMMUNITY): Payer: Medicare HMO

## 2023-03-11 DIAGNOSIS — J9601 Acute respiratory failure with hypoxia: Secondary | ICD-10-CM | POA: Diagnosis not present

## 2023-03-11 DIAGNOSIS — G9340 Encephalopathy, unspecified: Secondary | ICD-10-CM | POA: Diagnosis not present

## 2023-03-11 DIAGNOSIS — I2699 Other pulmonary embolism without acute cor pulmonale: Secondary | ICD-10-CM | POA: Diagnosis not present

## 2023-03-11 DIAGNOSIS — G928 Other toxic encephalopathy: Secondary | ICD-10-CM | POA: Diagnosis not present

## 2023-03-11 DIAGNOSIS — J9602 Acute respiratory failure with hypercapnia: Secondary | ICD-10-CM | POA: Diagnosis not present

## 2023-03-11 LAB — GLUCOSE, CAPILLARY
Glucose-Capillary: 101 mg/dL — ABNORMAL HIGH (ref 70–99)
Glucose-Capillary: 108 mg/dL — ABNORMAL HIGH (ref 70–99)
Glucose-Capillary: 126 mg/dL — ABNORMAL HIGH (ref 70–99)
Glucose-Capillary: 159 mg/dL — ABNORMAL HIGH (ref 70–99)
Glucose-Capillary: 55 mg/dL — ABNORMAL LOW (ref 70–99)
Glucose-Capillary: 91 mg/dL (ref 70–99)
Glucose-Capillary: 94 mg/dL (ref 70–99)

## 2023-03-11 LAB — CBC
HCT: 30 % — ABNORMAL LOW (ref 36.0–46.0)
Hemoglobin: 8.9 g/dL — ABNORMAL LOW (ref 12.0–15.0)
MCH: 29.2 pg (ref 26.0–34.0)
MCHC: 29.7 g/dL — ABNORMAL LOW (ref 30.0–36.0)
MCV: 98.4 fL (ref 80.0–100.0)
Platelets: 160 10*3/uL (ref 150–400)
RBC: 3.05 MIL/uL — ABNORMAL LOW (ref 3.87–5.11)
RDW: 13.4 % (ref 11.5–15.5)
WBC: 10.6 10*3/uL — ABNORMAL HIGH (ref 4.0–10.5)
nRBC: 0 % (ref 0.0–0.2)

## 2023-03-11 LAB — CSF CELL COUNT WITH DIFFERENTIAL
Eosinophils, CSF: 0 % (ref 0–1)
Lymphs, CSF: 34 % — ABNORMAL LOW (ref 40–80)
Monocyte-Macrophage-Spinal Fluid: 7 % — ABNORMAL LOW (ref 15–45)
RBC Count, CSF: 266 /mm3 — ABNORMAL HIGH
RBC Count, CSF: 2000 /mm3 — ABNORMAL HIGH
Segmented Neutrophils-CSF: 59 % — ABNORMAL HIGH (ref 0–6)
Tube #: 1
Tube #: 4
WBC, CSF: 11 /mm3 (ref 0–5)
WBC, CSF: 10 /mm3 (ref 0–5)

## 2023-03-11 LAB — BASIC METABOLIC PANEL
Anion gap: 5 (ref 5–15)
BUN: 15 mg/dL (ref 8–23)
CO2: 31 mmol/L (ref 22–32)
Calcium: 8.5 mg/dL — ABNORMAL LOW (ref 8.9–10.3)
Chloride: 103 mmol/L (ref 98–111)
Creatinine, Ser: 0.5 mg/dL (ref 0.44–1.00)
GFR, Estimated: >60 mL/min (ref >60)
Glucose, Bld: 181 mg/dL — ABNORMAL HIGH (ref 70–99)
Potassium: 3.7 mmol/L (ref 3.5–5.1)
Sodium: 139 mmol/L (ref 135–145)

## 2023-03-11 LAB — HEMOGLOBIN A1C
Hgb A1c MFr Bld: 5 % (ref 4.8–5.6)
Mean Plasma Glucose: 96.8 mg/dL

## 2023-03-11 LAB — HEPARIN LEVEL (UNFRACTIONATED)
Heparin Unfractionated: 0.13 [IU]/mL — ABNORMAL LOW (ref 0.30–0.70)
Heparin Unfractionated: 0.24 [IU]/mL — ABNORMAL LOW (ref 0.30–0.70)

## 2023-03-11 LAB — VITAMIN B12: Vitamin B-12: 965 pg/mL — ABNORMAL HIGH (ref 180–914)

## 2023-03-11 LAB — PHOSPHORUS: Phosphorus: 2.1 mg/dL — ABNORMAL LOW (ref 2.5–4.6)

## 2023-03-11 LAB — HIV ANTIBODY (ROUTINE TESTING W REFLEX): HIV Screen 4th Generation wRfx: NONREACTIVE

## 2023-03-11 LAB — MAGNESIUM: Magnesium: 1.6 mg/dL — ABNORMAL LOW (ref 1.7–2.4)

## 2023-03-11 MED ORDER — FENTANYL CITRATE PF 50 MCG/ML IJ SOSY
PREFILLED_SYRINGE | INTRAMUSCULAR | Status: AC
  Administered 2023-03-11: 100 ug
  Filled 2023-03-11: qty 2

## 2023-03-11 MED ORDER — THIAMINE HCL 100 MG/ML IJ SOLN
250.0000 mg | Freq: Every day | INTRAVENOUS | Status: AC
  Administered 2023-03-13 – 2023-03-18 (×6): 250 mg via INTRAVENOUS
  Filled 2023-03-11 (×6): qty 2.5

## 2023-03-11 MED ORDER — POTASSIUM PHOSPHATES 15 MMOLE/5ML IV SOLN
15.0000 mmol | Freq: Once | INTRAVENOUS | Status: AC
Start: 2023-03-11 — End: 2023-03-11
  Administered 2023-03-11: 15 mmol via INTRAVENOUS
  Filled 2023-03-11: qty 5

## 2023-03-11 MED ORDER — MAGNESIUM SULFATE 4 GM/100ML IV SOLN
4.0000 g | Freq: Once | INTRAVENOUS | Status: AC
  Administered 2023-03-11: 4 g via INTRAVENOUS
  Filled 2023-03-11: qty 100

## 2023-03-11 MED ORDER — THIAMINE HCL 100 MG/ML IJ SOLN
500.0000 mg | Freq: Three times a day (TID) | INTRAVENOUS | Status: AC
  Administered 2023-03-11 – 2023-03-13 (×6): 500 mg via INTRAVENOUS
  Filled 2023-03-11 (×7): qty 5

## 2023-03-11 MED ORDER — DEXTROSE 50 % IV SOLN
INTRAVENOUS | Status: AC
  Administered 2023-03-11: 25 mL
  Filled 2023-03-11: qty 50

## 2023-03-11 MED ORDER — THIAMINE HCL 100 MG/ML IJ SOLN
100.0000 mg | Freq: Every day | INTRAMUSCULAR | Status: DC
  Administered 2023-03-19 – 2023-04-03 (×15): 100 mg via INTRAVENOUS
  Filled 2023-03-11 (×16): qty 2

## 2023-03-11 NOTE — Progress Notes (Signed)
NAME:  Karen [name], MRN:  [id number], DOB:  1951-04-10, LOS: 3 ADMISSION DATE:  03/08/2023, CONSULTATION DATE:  03/08/23  REFERRING MD: MD Halivand CHIEF COMPLAINT:  AMS, hypoxia   History of Present Illness:  Pt is a 72 yr old female with a significant pmhx of HTN, Hypothyroidism, Major Depressive Disorder, cognitive impairment, arthritis, hearing loss, 30-40lb unintentional weight loss since 05/2022. Per patient's husband and daughter endorse that patient has had a functional and cognitive decline since the beginning of the 2024. Patient has had an extensive workup in regards to cognitive impairment and unintentional weight loss with MRI Brain, MRCPs, CT of chest and abdomen with no definitive etiology.   Patient presented to [facility] ED with AMS and hypoxia (O2 Sats in 70s-80s) by EMS from [facility], who was scheduled for a colonoscopy on 11/20. Patient did not receive colonoscopy due to hypoxia and AMS. Initial VBG revealed pH 7.17 and pCO2 > 123. Patient was given narcan during the initial ED workup with no response and subsequently placed on BIPAP due to increasing hypoxia and respiratory distress. Repeat VBG showed no improvement and patient was intubated due to hypoxia/hypercarbia. Also, CT of chest obtained by EDP showed evidence of scattered subsegmental PE and PCCM was consulted for further vent management as well as management for PE.   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   Significant Hospital Events: Including procedures, antibiotic start and stop dates in addition to other pertinent events   11/20 Admit with AMS, acute hypoxic/hypercarbic respiratory failure, subsegmental PE intubated PCCM consult 11/21 Intubated, sedated, tremors  11/22 Tachycardiac this am with rate in the mid 140, shivering/tremors seen with physical touch  11/23  Interim History / Subjective:  S/p LP. EEG neg. Neuro consulted  Objective   Blood pressure (!) 93/45, pulse (!) 106, temperature (!) 100.6 F (38.1 C), resp. rate 16, height 5\' 4"  (1.626 m), weight 50.7 kg, SpO2 99%.    Vent Mode: PSV;CPAP FiO2 (%):  [30 %-40 %] 30 % Set Rate:  [14 bmp] 14 bmp Vt Set:  [330 mL] 330 mL PEEP:  [5 cmH20] 5 cmH20 Pressure Support:  [10 cmH20] 10 cmH20 Plateau Pressure:  [11 cmH20-19 cmH20] 14 cmH20   Intake/Output Summary (Last 24 hours) at 03/11/2023 0852 Last data filed at 03/11/2023 0753 Gross per 24 hour  Intake 4512.06 ml  Output 775 ml  Net 3737.06 ml   Filed Weights   03/08/23 2100 03/09/23 0500  Weight: 55.3 kg 50.7 kg   Physical Exam: General: Chronically ill and elderly-appearing, no acute distress HENT: [place], AT, ETT in place Eyes: EOMI, no scleral icterus Respiratory: Clear to auscultation bilaterally.  No crackles, wheezing or rales Cardiovascular: RRR, -M/R/G, no JVD GI: BS+, soft, nontender Extremities:-Edema,-tenderness Neuro: Encephalopathic, does not follow commands, moves extremities x 4, +cough/gag  Imaging, labs and test in EMR in the last 24 hours reviewed independently by me. Pertinent findings below: ABG with chronic hypercarbic respiratory failure    Resolved Hospital Problem  list   Hypernatremia  Hypomagnesemia  Hypophosphatemia   Assessment & Plan:   Acute hypoxemic/hypercarbic respiratory failure  Subsegmental PEs  -No clear etiology to determine cause of hypercarbia, PFTs (2019) within normal limits. Suspect hypoventilation in setting of PE likely due to immobility. Concerned for occult malignancy which patient is currently undergoing work-up as an outpatient.  P:  Full vent support LTVV, 4-8cc/kg IBW with goal Pplat<30 and DP<15 Head of bed elevated 30 degrees. Plateau pressures less than 30 cm H20.  Follow intermittent chest x-ray and ABG.   SBT/WUA daily, mentation preclude extubation  Ensure adequate pulmonary hygiene  Continue Ceftriaxone Follow cultures  VAP bundle in place  PAD protocol, wean as able  Continue Heparin drip  Ongoing neuro workup as below   Acute Encephalopathy - hypercarbic resolved but remains altered Progressive weakness with lower extremity tremors  -In the setting of hypercarbia but recent history of cognitive impairment with memory loss and lower extremity weakness with some tremors per family    -TSH 1.145, T4 1.13 -Spot EEG negative  -LP overall unremarkable with minimally elevated protein, normal glucose, neg meningitis/encephalitis panel P:  Neuro consulted MRI without contrast ordered DC acyclovir and ampicillin Maintain neuro protective measures; goal for eurothermia, euglycemia, eunatermia, normoxia, and PCO2 goal of 35-40 Nutrition and bowel regiment  Seizure precautions  Aspirations precautions   Sedation related hypotension - low suspicion for sepsis Wean levophed for MAP goal >65 On Ceftriaxone but no clear source of infection. Dc'd acyclovir and ampicillin F/u cultures  Failure to Thrive Weight loss/Malnutrition  -BMI 19.19, 30-40lb wt loss since 2/24  P: RD following appreciate assistance   HTN  P:  Continuous telemetry  Continue as needed hydralazine   GERD  P:  Continue PPI   Moderate protein calorie malnutrition -As evidence by mild fat depletion, moderate  muscle depletion and recent weight loss P: Continue Tube feeds  Protein supplementation  RD following   Intermittent hypoglycemia  P: Start tube feeds as above  CBG checks q4  Best Practice (right click and "Reselect all SmartList Selections" daily)   Diet/type: tubefeeds DVT prophylaxis: systemic heparin Pressure ulcer(s). None present GI prophylaxis: PPI Lines: N/A Foley:  Yes, and it is still needed Code Status:  full code Last date of multidisciplinary goals of care discussion: 11/21. Full code Updated daughter and son at bedside Critical care time:    The patient is critically ill with multiple organ systems failure and requires high complexity decision making for assessment and support, frequent evaluation and titration of therapies, application of advanced monitoring technologies and extensive interpretation of multiple databases.  Independent Critical Care Time: 45 Minutes.   Mechele Collin, M.D. [facility] Pulmonary/Critical Care Medicine 03/11/2023 9:05 AM   Please see Amion for pager number to reach on-call Pulmonary and Critical Care Team.

## 2023-03-11 NOTE — Progress Notes (Signed)
PHARMACY - ANTICOAGULATION CONSULT NOTE  Pharmacy Consult for heparin Indication: acute pulmonary embolus  Allergies  Allergen Reactions   Zoloft [Sertraline Hcl] Anxiety and Other (See Comments)    "crazy feeling"    Patient Measurements: Height: 5\' 4"  (162.6 cm) Weight: 50.7 kg (111 lb 12.4 oz) IBW/kg (Calculated) : 54.7 Heparin Dosing Weight: TBW  Vital Signs: Temp: 100.8 F (38.2 C) (11/23 1200) Temp Source: Bladder (11/23 1200) BP: 111/58 (11/23 1200) Pulse Rate: 98 (11/23 1200)  Labs: Recent Labs     0000 03/08/23 2138 03/08/23 2332 03/09/23 0243 03/09/23 0854 03/09/23 1545 03/09/23 2041 03/10/23 0708 03/10/23 1730 03/11/23 0513 03/11/23 1450  HGB   < >  --   --  10.1*  --   --   --  10.9*  --  8.9*  --   HCT  --   --   --  34.5*  --   --   --  38.7  --  30.0*  --   PLT  --   --   --  191  --   --   --  174  --  160  --   HEPARINUNFRC  --   --   --  0.86*   < >  --    < > 0.47 0.17* 0.13* 0.24*  CREATININE  --   --   --  0.49  --  0.46  --   --   --  0.50  --   TROPONINIHS  --  32* 32*  --   --   --   --   --   --   --   --    < > = values in this interval not displayed.    Estimated Creatinine Clearance: 52.4 mL/min (by C-G formula based on SCr of 0.5 mg/dL).   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 Three variants of uncertain significance were detected - one in the AXIN2 gene called c.1829G>A, one in the PMS2 gene  called c.964G>A, and one in the RNF43 gene called c.1114C>T. The report date is 06/24/2019.     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   Tick bite 08/2015   UTI (urinary tract infection) 10/2015   Assessment: 72 YO female presenting for a colonoscopy procedure 11/20 with SpO2 70-80% and slight shortness of breath. She was transferred to the [facility] ED where CTA chest revealed scattered subsegmental pulmonary emboli throughout the right lung, no RHS. She was intubated in the ED due to lack of improvement on BiPAP. No anticoagulation noted PTA. Pharmacy consulted for heparin dosing.  Significant events: -11/22 at 5pm: lumbar puncture by IR   Today, 03/11/2023: -Heparin level remains sub-therapeutic despite previous increase in heparin rate, although level now trending up -No complications of therapy noted   Goal of Therapy:  Heparin level 0.3-0.7 units/ml Monitor platelets by anticoagulation protocol: Yes   Plan:  -Increase heparin infusion to 800 units/hr -Recheck 8 hr heparin level -Daily CBC -Monitor for s/sx of bleeding   Pricilla Riffle, PharmD, BCPS Clinical Pharmacist 03/11/2023 4:51 PM

## 2023-03-11 NOTE — Progress Notes (Addendum)
PHARMACY - ANTICOAGULATION CONSULT NOTE  Pharmacy Consult for heparin Indication: acute pulmonary embolus  Allergies  Allergen Reactions   Zoloft [Sertraline Hcl] Anxiety and Other (See Comments)    "crazy feeling"    Patient Measurements: Height: 5\' 4"  (162.6 cm) Weight: 50.7 kg (111 lb 12.4 oz) IBW/kg (Calculated) : 54.7 Heparin Dosing Weight: TBW  Vital Signs: Temp: 100.6 F (38.1 C) (11/23 0535) Temp Source: Bladder (11/23 0200) BP: 119/53 (11/23 0430) Pulse Rate: 105 (11/23 0535)  Labs: Recent Labs    03/08/23 1242 03/08/23 1430 03/08/23 2138 03/08/23 2332 03/09/23 0243 03/09/23 0854 03/09/23 1545 03/09/23 2041 03/10/23 0708 03/10/23 1730 03/11/23 0513  HGB 13.2  --   --   --  10.1*  --   --   --  10.9*  --  8.9*  HCT 45.1  --   --   --  34.5*  --   --   --  38.7  --  30.0*  PLT 237  --   --   --  191  --   --   --  174  --  160  APTT 27  --   --   --   --   --   --   --   --   --   --   LABPROT 13.9  --   --   --   --   --   --   --   --   --   --   INR 1.1  --   --   --   --   --   --   --   --   --   --   HEPARINUNFRC  --   --   --   --  0.86*   < >  --    < > 0.47 0.17* 0.13*  CREATININE  --    < >  --   --  0.49  --  0.46  --   --   --  0.50  TROPONINIHS  --   --  32* 32*  --   --   --   --   --   --   --    < > = values in this interval not displayed.    Estimated Creatinine Clearance: 52.4 mL/min (by C-G formula based on SCr of 0.5 mg/dL).   Medical History: Past Medical History:  Diagnosis Date   Anemia    remote   Arthritis    hips and knee   Bilateral sensorineural hearing loss 03/26/2019   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   Cognitive impairment    Family history of breast cancer    Family history of lung cancer    Family history of ovarian cancer    Family history of pancreatic cancer    Family history of throat cancer    Generalized anxiety disorder 03/26/2019   Genetic testing 06/24/2019   Negative genetic  testing:  No pathogenic variants detected on the [facility] Multi-Cancer panel. Three variants of uncertain significance were detected - one in the AXIN2 gene called c.1829G>A, one in the PMS2 gene called c.964G>A, and one in the RNF43 gene called c.1114C>T. The report date is 06/24/2019.     The Multi-Cancer Panel offered by [facility] includes sequencing and/or deletion duplication test   GERD (gastroesophageal reflux disease)    History of colonic polyps    Hyperlipidemia    Hypertension    Hypothyroidism  Major depressive disorder    Osteoarthritis of knee 07/20/2021   Subjective tinnitus of both ears 03/26/2019   Tick bite 08/2015   UTI (urinary tract infection) 10/2015   Assessment: 72 YO female presenting for a colonoscopy procedure 11/20 with SpO2 70-80% and slight shortness of breath. She was transferred to the [facility] ED where CTA PE revealed scattered subsegmental pulmonary emboli throughout the right lung, no RHS. She was intubated in the ED due to lack of improvement on BiPAP. No anticoagulation noted PTA. Pharmacy consulted for heparin dosing.   Significant events: - 11/22 at 5pm: lumbar puncture by IR   Today, 03/11/2023: - heparin level remains sub-therapeutic at 0.13 despite heparin rate increased to 600 units/hr last night  - per pt's RN, no issues with IV line and no bleeding noted - hgb down 8.9, plts 160K   Goal of Therapy:  Heparin level 0.3-0.7 units/ml Monitor platelets by anticoagulation protocol: Yes   Plan:  - increase heparin drip to 700 units/hr - check 8 hr heparin level -  monitor for s/sx of bleeding  Dorna Leitz, PharmD, BCPS 03/11/2023 6:35 AM

## 2023-03-11 NOTE — Consult Note (Addendum)
NEUROLOGY CONSULT NOTE   Date of service: March 11, 2023 Patient Name: Karen [name] MRN:  [id number] DOB:  [birth year] Chief Complaint: "prolonged AMS" Requesting Provider: Luciano Cutter, MD  History of Present Illness  Karen [name] is a 72 y.o. 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   Past medical history of anemia arthritis dementia family history of pancreatic cancer, general anxiety disorder and depression on antidepressants, major depressive disorder, ongoing poor p.o. intake presented to the hospital for further evaluation of altered mental status and hypoxia and hypercarbia.  She was sent from GI clinic where she was scheduled for a colonoscopy on 03/08/2023 but was hypoxic and altered at the time of presentation.  She was placed on BiPAP due to increasing hypoxia and sent to the hospital where she needed to be intubated. Her daughter was at bedside that provided history-she has been having trouble with her short-term memory now for at least 3 to 4 years.  This has been a progressive issue with no alleviating factors.  Over the last few months to years she has been missing appointments and missing paying bills. Lives at home with husband.    She has been evaluated by [facility] neurology, started on donepezil.   Unable to see those charts in Care Everywhere.  She was on vitamin supplementation which was stopped by one of the providers-reason unknown. She had extremely poor p.o. intake and has had what appears to be failure to thrive per family.  Hospital course complicated by subsegmental pulmonary emboli.  Neurology was consulted due to an abrupt decline although getting history from family it seems like this decline has been now ongoing not just for a few months to year but at least 3 to 4 years per the daughter.   ROS  Unable to ascertain due to the patient being intubated  Past History   Past Medical History:  Diagnosis Date   Anemia    remote   Arthritis    hips and knee   Bilateral sensorineural hearing loss 03/26/2019   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   Cognitive impairment    Family history of breast cancer    Family history of lung cancer    Family history of ovarian cancer    Family history of pancreatic cancer    Family history of throat cancer    Generalized anxiety disorder 03/26/2019   Genetic testing 06/24/2019   Negative genetic testing:  No pathogenic variants detected on the [facility] Multi-Cancer panel. Three variants of uncertain significance were detected - one in the AXIN2 gene called c.1829G>A, one in the PMS2 gene called c.964G>A, and one in the RNF43 gene called c.1114C>T. The report date is 06/24/2019.     The Multi-Cancer Panel offered by [facility] includes sequencing and/or deletion duplication test   GERD (gastroesophageal reflux disease)    History of colonic polyps    Hyperlipidemia    Hypertension    Hypothyroidism    Major depressive disorder    Osteoarthritis of knee 07/20/2021  Subjective tinnitus of both ears 03/26/2019   Tick bite 08/2015   UTI (urinary tract infection) 10/2015    Past Surgical History:  Procedure Laterality Date   CESAREAN SECTION     X 4   COLONOSCOPY     COLONOSCOPY WITH PROPOFOL N/A 11/17/2015   Procedure:  COLONOSCOPY WITH PROPOFOL;  Surgeon: Charolett Bumpers, MD;  Location: [facility];  Service: Endoscopy;  Laterality: N/A;   POLYPECTOMY      Family History: Family History  Problem Relation Age of Onset   Hypertension Mother    Pancreatic cancer Son 85   Ovarian cancer Maternal Grandmother 69   Pancreatic cancer Half-Brother 93       non-smoker   Lung cancer Maternal Aunt 72       non-smoker   Throat cancer Maternal Aunt        dx. 82s, non-smoker   Breast cancer Cousin 51   Colon cancer Neg Hx    Esophageal cancer Neg Hx    Colon polyps Neg Hx    Rectal cancer Neg Hx    Stomach cancer Neg Hx     Social History  reports that she quit smoking about 22 years ago. Her smoking use included cigarettes. She started smoking about 32 years ago. She has never used smokeless tobacco. She reports that she does not currently use drugs. She reports that she does not drink alcohol.  Allergies  Allergen Reactions   Zoloft [Sertraline Hcl] Anxiety and Other (See Comments)    "crazy feeling"    Medications   Current Facility-Administered Medications:    acetaminophen (TYLENOL) tablet 650 mg, 650 mg, Per Tube, Q6H PRN, Paliwal, Aditya, MD, 650 mg at 03/11/23 1248   atenolol (TENORMIN) tablet 25 mg, 25 mg, Per Tube, BID, Harris, Whitney D, NP, 25 mg at 03/11/23 1020   cefTRIAXone (ROCEPHIN) 2 g in sodium chloride 0.9 % 100 mL IVPB, 2 g, Intravenous, Q12H, Cherylin Mylar, RPH, Stopped at 03/11/23 0319   Chlorhexidine Gluconate Cloth 2 % PADS 6 each, 6 each, Topical, Daily, Melody Comas B, MD, 6 each at 03/10/23 2320   docusate sodium (COLACE) capsule 100 mg, 100 mg, Oral, BID PRN, Melody Comas B, MD, 100 mg at 03/11/23 0551   feeding supplement (VITAL AF 1.2 CAL) liquid 1,000 mL, 1,000 mL, Per Tube, Continuous, Luciano Cutter, MD, Last Rate: 55 mL/hr at 03/11/23 1239, Infusion Verify at 03/11/23 1239   fentaNYL (SUBLIMAZE) bolus via infusion 25-100 mcg, 25-100 mcg, Intravenous, Q15  min PRN, Jacalyn Lefevre, MD, 50 mcg at 03/11/23 1028   fentaNYL in NS (49mcg/ml) infusion-PREMIX, 25-200 mcg/hr, Intravenous, Continuous, Jacalyn Lefevre, MD, Last Rate: 5 mL/hr at 03/11/23 1239, 50 mcg/hr at 03/11/23 1239   heparin ADULT infusion 100 units/mL (25000 units/269mL), 700 Units/hr, Intravenous, Continuous, Pham, Anh P, RPH, Last Rate: 7 mL/hr at 03/11/23 1239, 700 Units/hr at 03/11/23 1239   hydrALAZINE (APRESOLINE) injection 10 mg, 10 mg, Intravenous, Q6H PRN, Martina Sinner, MD   ipratropium-albuterol (DUONEB) 0.5-2.5 (3) MG/3ML nebulizer solution 3 mL, 3 mL, Nebulization, Q6H PRN, Martina Sinner, MD   lip balm (CARMEX) ointment, , Topical, PRN, Duayne Cal, NP   midazolam (VERSED) injection 1-2 mg, 1-2 mg, Intravenous, Q1H PRN, Janeann Forehand D, NP, 2 mg at 03/10/23 1630   norepinephrine (LEVOPHED) 4mg  in (0.016 mg/mL) premix infusion, 2-10 mcg/min, Intravenous, Titrated, Harris, Whitney D, NP, Last Rate: 26.3 mL/hr at 03/11/23 1239, 7 mcg/min at 03/11/23 1239  Oral care mouth rinse, 15 mL, Mouth Rinse, Q2H, Dewald, Jonathan B, MD, 15 mL at 03/11/23 1155   Oral care mouth rinse, 15 mL, Mouth Rinse, PRN, Martina Sinner, MD   pantoprazole (PROTONIX) injection 40 mg, 40 mg, Intravenous, Q24H, Rochel Brome C, NP, 40 mg at 03/10/23 2329   polyethylene glycol (MIRALAX / GLYCOLAX) packet 17 g, 17 g, Oral, Daily PRN, Melody Comas B, MD, 17 g at 03/11/23 0551   potassium PHOSPHATE 15 mmol in dextrose 5 % 250 mL infusion, 15 mmol, Intravenous, Once, Luciano Cutter, MD, Last Rate: 43 mL/hr at 03/11/23 1239, Infusion Verify at 03/11/23 1239   propofol (DIPRIVAN) 1000 MG/100ML infusion, 0-50 mcg/kg/min, Intravenous, Continuous, Jacalyn Lefevre, MD, Last Rate: 11.61 mL/hr at 03/11/23 1239, 35 mcg/kg/min at 03/11/23 1239   thiamine (VITAMIN B1) tablet 100 mg, 100 mg, Per Tube, Daily, Luciano Cutter, MD, 100 mg at 03/11/23 1020   [COMPLETED] vancomycin  (VANCOCIN) IVPB 1000 mg/200 mL premix, 1,000 mg, Intravenous, Once, Stopped at 03/10/23 1535 **FOLLOWED BY** vancomycin (VANCOREADY) IVPB 500 mg/100 mL, 500 mg, Intravenous, Q12H, Cherylin Mylar, RPH, Stopped at 03/11/23 0428  Vitals   Vitals:   03/11/23 1020 03/11/23 1028 03/11/23 1134 03/11/23 1200  BP: 122/65 (!) 112/53  (!) 111/58  Pulse: (!) 117 (!) 122  98  Resp:  19  14  Temp:  (!) 100.9 F (38.3 C)  (!) 100.8 F (38.2 C)  TempSrc:    Bladder  SpO2:  99% 92% 98%  Weight:      Height:        Body mass index is 19.19 kg/m.  Physical Exam  General: Cachectic looking woman intubated HEENT: Normocephalic atraumatic Lungs: Vented Abdomen: Nondistended nontender Cardiovascular: Regular rhythm Neurological exam Intubated sedated No spontaneous movements Resists eye opening Pupils are equal round react to light sluggishly Positive corneals, cough and gag reflex. Grimaces to noxious simulation in upper extremities Withdrawal to noxious simulation in the lower extremities   Labs/Imaging/Neurodiagnostic studies   CBC:  Recent Labs  Lab Mar 20, 2023 1242 03/09/23 0243 03/10/23 0708 03/11/23 0513  WBC 6.4   < > 10.7* 10.6*  NEUTROABS 4.5  --   --   --   HGB 13.2   < > 10.9* 8.9*  HCT 45.1   < > 38.7 30.0*  MCV 97.8   < > 101.6* 98.4  PLT 237   < > 174 160   < > = values in this interval not displayed.   Basic Metabolic Panel:  Lab Results  Component Value Date   NA 139 03/11/2023   K 3.7 03/11/2023   CO2 31 03/11/2023   GLUCOSE 181 (H) 03/11/2023   BUN 15 03/11/2023   CREATININE 0.50 03/11/2023   CALCIUM 8.5 (L) 03/11/2023   GFRNONAA >60 03/11/2023   GFRAA >60 01/01/2015   Lipid Panel: No results found for: "LDLCALC" HgbA1c: No results found for: "HGBA1C" Urine Drug Screen:     Component Value Date/Time   LABOPIA NONE DETECTED 2023/03/20 2336   COCAINSCRNUR NONE DETECTED 03-20-23 2336   LABBENZ NONE DETECTED 2023/03/20 2336   AMPHETMU NONE DETECTED  03-20-23 2336   THCU NONE DETECTED 20-Mar-2023 2336   LABBARB NONE DETECTED 03-20-23 2336    Alcohol Level No results found for: "ETH" INR  Lab Results  Component Value Date   INR 1.1 2023/03/20   APTT  Lab Results  Component Value Date   APTT 27 Mar 20, 2023   CT Head without contrast(Personally reviewed): No  acute intracranial abnormality.  CT angiography of the chest  ASSESSMENT   Karen [name] is a 72 y.o. 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  Presented for evaluation of altered mental status when she presented for routine colonoscopy.  Her mentation has been worsening including short-term memory for many years but some of this acute worsening was likely due to hypoxia/hypercarbia in the setting of PE. Getting history from the family seems like that her short-term memory has been worsening at least for 3 to 4 years.  She has been started on Aricept by an outpatient provider at [facility]-I was unfortunately not able to access results in Care Everywhere. At this point, I would treat the acute issues and check for any reversible causes of dementia including nutritional deficiencies, undiagnosed diabetes and infectious causes. She has had an LP done-unremarkable thus far. There is also concern for underlying malignancy  due to rapid weight loss-the paraneoplastic panel may not be a bad idea.  Impression Acute toxic metabolic encephalopathy in a patient with underlying chronic dementia. Acute PE Hypoxic/hypercarbic respiratory failure  RECOMMENDATIONS  Check B12, B1 and B6 levels.  Check folate. Check A1C.  TSH was normal level. Check RPR High-dose thiamine supplementation. MR brain without contrast to rule out acute strokes Paraneoplastic panel and malignancy evaluation per primary team. She will need outpatient neuropsych testing once she is stable enough to better characterize the deficits that have been described so that she could get appropriate assistance in management.  Inpatient neurology will follow the above studies with you.  Plan discussed with Dr. Everardo All, PCCM and patients daughters at bedside.   ______________________________________________________________________    Dene Gentry, MD [facility]  CRITICAL CARE ATTESTATION Performed by: Milon Dikes, MD Total critical care time: 40 minutes Critical care time was exclusive of separately billable procedures and treating other patients and/or supervising APPs/Residents/Students Critical care was necessary to treat or prevent imminent or life-threatening deterioration. This patient is critically ill and at significant risk for neurological worsening and/or death and care requires constant monitoring. Critical care was time spent personally by me on the following activities: development of treatment plan with patient and/or surrogate as well as nursing, discussions with consultants, evaluation of patient's response to treatment, examination of patient, obtaining history from patient or surrogate, ordering and performing treatments and interventions, ordering and review of laboratory studies, ordering and review of radiographic studies, pulse oximetry, re-evaluation of patient's condition, participation in  multidisciplinary rounds and medical decision making of high complexity in the care of this patient.

## 2023-03-11 NOTE — Plan of Care (Signed)
Problem: Clinical Measurements: Goal: Ability to maintain clinical measurements within normal limits will improve Outcome: Progressing Goal: Will remain free from infection Outcome: Progressing Goal: Diagnostic test results will improve Outcome: Progressing Goal: Respiratory complications will improve Outcome: Progressing Goal: Cardiovascular complication will be avoided Outcome: Progressing   Problem: Education: Goal: Knowledge of General Education information will improve Description: Including pain rating scale, medication(s)/side effects and non-pharmacologic comfort measures Outcome: Not Progressing   Problem: Health Behavior/Discharge Planning: Goal: Ability to manage health-related needs will improve Outcome: Not Progressing   Problem: Activity: Goal: Risk for activity intolerance will decrease Outcome: Not Progressing

## 2023-03-11 NOTE — Progress Notes (Signed)
Pt transported to [facility] with no complications.

## 2023-03-12 ENCOUNTER — Inpatient Hospital Stay (HOSPITAL_COMMUNITY): Payer: Medicare HMO

## 2023-03-12 DIAGNOSIS — R569 Unspecified convulsions: Secondary | ICD-10-CM | POA: Diagnosis not present

## 2023-03-12 DIAGNOSIS — G9340 Encephalopathy, unspecified: Secondary | ICD-10-CM | POA: Diagnosis not present

## 2023-03-12 DIAGNOSIS — I2699 Other pulmonary embolism without acute cor pulmonale: Secondary | ICD-10-CM | POA: Diagnosis not present

## 2023-03-12 DIAGNOSIS — J9601 Acute respiratory failure with hypoxia: Secondary | ICD-10-CM | POA: Diagnosis not present

## 2023-03-12 DIAGNOSIS — J9602 Acute respiratory failure with hypercapnia: Secondary | ICD-10-CM | POA: Diagnosis not present

## 2023-03-12 LAB — BASIC METABOLIC PANEL WITH GFR
Anion gap: 3 — ABNORMAL LOW (ref 5–15)
BUN: 10 mg/dL (ref 8–23)
CO2: 30 mmol/L (ref 22–32)
Calcium: 8.5 mg/dL — ABNORMAL LOW (ref 8.9–10.3)
Chloride: 106 mmol/L (ref 98–111)
Creatinine, Ser: 0.39 mg/dL — ABNORMAL LOW (ref 0.44–1.00)
GFR, Estimated: >60 mL/min
Glucose, Bld: 136 mg/dL — ABNORMAL HIGH (ref 70–99)
Potassium: 3.8 mmol/L (ref 3.5–5.1)
Sodium: 139 mmol/L (ref 135–145)

## 2023-03-12 LAB — HEPARIN LEVEL (UNFRACTIONATED)
Heparin Unfractionated: 0.24 [IU]/mL — ABNORMAL LOW (ref 0.30–0.70)
Heparin Unfractionated: 0.27 [IU]/mL — ABNORMAL LOW (ref 0.30–0.70)
Heparin Unfractionated: 0.37 [IU]/mL (ref 0.30–0.70)

## 2023-03-12 LAB — CBC
HCT: 30.3 % — ABNORMAL LOW (ref 36.0–46.0)
Hemoglobin: 8.7 g/dL — ABNORMAL LOW (ref 12.0–15.0)
MCH: 28.5 pg (ref 26.0–34.0)
MCHC: 28.7 g/dL — ABNORMAL LOW (ref 30.0–36.0)
MCV: 99.3 fL (ref 80.0–100.0)
Platelets: 167 K/uL (ref 150–400)
RBC: 3.05 MIL/uL — ABNORMAL LOW (ref 3.87–5.11)
RDW: 13.9 % (ref 11.5–15.5)
WBC: 9 K/uL (ref 4.0–10.5)
nRBC: 0 % (ref 0.0–0.2)

## 2023-03-12 LAB — FOLATE: Folate: 7.5 ng/mL (ref >5.9)

## 2023-03-12 LAB — RPR: RPR Ser Ql: NONREACTIVE

## 2023-03-12 LAB — GLUCOSE, CAPILLARY
Glucose-Capillary: 102 mg/dL — ABNORMAL HIGH (ref 70–99)
Glucose-Capillary: 108 mg/dL — ABNORMAL HIGH (ref 70–99)
Glucose-Capillary: 110 mg/dL — ABNORMAL HIGH (ref 70–99)
Glucose-Capillary: 110 mg/dL — ABNORMAL HIGH (ref 70–99)
Glucose-Capillary: 125 mg/dL — ABNORMAL HIGH (ref 70–99)
Glucose-Capillary: 90 mg/dL (ref 70–99)

## 2023-03-12 LAB — TRIGLYCERIDES: Triglycerides: 306 mg/dL — ABNORMAL HIGH (ref <150)

## 2023-03-12 MED ORDER — POLYETHYLENE GLYCOL 3350 17 G PO PACK: 17.0000 g | PACK | Freq: Every day | ORAL | Status: DC | PRN

## 2023-03-12 MED ORDER — POLYETHYLENE GLYCOL 3350 17 G PO PACK
17.0000 g | PACK | Freq: Every day | ORAL | Status: DC
  Administered 2023-03-12 – 2023-03-17 (×3): 17 g
  Filled 2023-03-12 (×4): qty 1

## 2023-03-12 MED ORDER — SODIUM CHLORIDE 0.9 % IV SOLN: INTRAVENOUS | Status: AC | PRN

## 2023-03-12 MED ORDER — FENTANYL CITRATE PF 50 MCG/ML IJ SOSY
25.0000 ug | PREFILLED_SYRINGE | INTRAMUSCULAR | Status: DC | PRN
  Filled 2023-03-12: qty 1

## 2023-03-12 MED ORDER — FENTANYL CITRATE PF 50 MCG/ML IJ SOSY
25.0000 ug | PREFILLED_SYRINGE | INTRAMUSCULAR | Status: DC | PRN
  Administered 2023-03-12 (×2): 100 ug via INTRAVENOUS
  Administered 2023-03-12: 50 ug via INTRAVENOUS
  Administered 2023-03-13 (×4): 100 ug via INTRAVENOUS
  Administered 2023-03-13: 50 ug via INTRAVENOUS
  Administered 2023-03-13 – 2023-03-14 (×8): 100 ug via INTRAVENOUS
  Administered 2023-03-14: 50 ug via INTRAVENOUS
  Administered 2023-03-14 (×2): 100 ug via INTRAVENOUS
  Administered 2023-03-14: 50 ug via INTRAVENOUS
  Filled 2023-03-12 (×9): qty 2
  Filled 2023-03-12: qty 1
  Filled 2023-03-12 (×6): qty 2
  Filled 2023-03-12: qty 1
  Filled 2023-03-12: qty 2
  Filled 2023-03-12: qty 1
  Filled 2023-03-12: qty 2

## 2023-03-12 MED ORDER — DEXMEDETOMIDINE HCL IN NACL 200 MCG/50ML IV SOLN
0.0000 ug/kg/h | INTRAVENOUS | Status: DC
  Administered 2023-03-12: 0.7 ug/kg/h via INTRAVENOUS
  Administered 2023-03-12: 0.4 ug/kg/h via INTRAVENOUS
  Administered 2023-03-12: 0.5 ug/kg/h via INTRAVENOUS
  Administered 2023-03-13: 0.8 ug/kg/h via INTRAVENOUS
  Administered 2023-03-13: 1 ug/kg/h via INTRAVENOUS
  Administered 2023-03-13 (×2): 0.8 ug/kg/h via INTRAVENOUS
  Administered 2023-03-13: 0.9 ug/kg/h via INTRAVENOUS
  Administered 2023-03-13: 1 ug/kg/h via INTRAVENOUS
  Administered 2023-03-14: 0.5 ug/kg/h via INTRAVENOUS
  Administered 2023-03-14: 1.1 ug/kg/h via INTRAVENOUS
  Administered 2023-03-14: 1 ug/kg/h via INTRAVENOUS
  Administered 2023-03-14: 0.7 ug/kg/h via INTRAVENOUS
  Administered 2023-03-14: 1 ug/kg/h via INTRAVENOUS
  Administered 2023-03-15: 0.9 ug/kg/h via INTRAVENOUS
  Administered 2023-03-15: 0.6 ug/kg/h via INTRAVENOUS
  Administered 2023-03-15: 0.9 ug/kg/h via INTRAVENOUS
  Administered 2023-03-16: 0.4 ug/kg/h via INTRAVENOUS
  Administered 2023-03-16: 0.9 ug/kg/h via INTRAVENOUS
  Administered 2023-03-16: 0.6 ug/kg/h via INTRAVENOUS
  Administered 2023-03-17: 0.9 ug/kg/h via INTRAVENOUS
  Administered 2023-03-17: 0.4 ug/kg/h via INTRAVENOUS
  Administered 2023-03-18 (×2): 1.2 ug/kg/h via INTRAVENOUS
  Filled 2023-03-12 (×28): qty 50

## 2023-03-12 MED ORDER — DOCUSATE SODIUM 50 MG/5ML PO LIQD
100.0000 mg | Freq: Two times a day (BID) | ORAL | Status: DC
  Administered 2023-03-12 – 2023-03-17 (×8): 100 mg
  Filled 2023-03-12 (×9): qty 10

## 2023-03-12 MED ORDER — DOCUSATE SODIUM 50 MG/5ML PO LIQD: 100.0000 mg | Freq: Two times a day (BID) | ORAL | Status: DC | PRN

## 2023-03-12 MED ORDER — SODIUM CHLORIDE 0.9 % IV SOLN
2.0000 g | INTRAVENOUS | Status: AC
  Administered 2023-03-13 – 2023-03-14 (×3): 2 g via INTRAVENOUS
  Filled 2023-03-12 (×3): qty 20

## 2023-03-12 NOTE — Progress Notes (Signed)
PHARMACY - ANTICOAGULATION CONSULT NOTE  Pharmacy Consult for heparin Indication: acute pulmonary embolus  Allergies  Allergen Reactions   Zoloft [Sertraline Hcl] Anxiety and Other (See Comments)    "crazy feeling"    Patient Measurements: Height: 5\' 4"  (162.6 cm) Weight: 58.8 kg (129 lb 10.1 oz) IBW/kg (Calculated) : 54.7 Heparin Dosing Weight: TBW  Vital Signs: Temp: 100 F (37.8 C) (11/24 0800) Temp Source: Bladder (11/24 0400) BP: 112/41 (11/24 0800) Pulse Rate: 115 (11/24 0800)  Labs: Recent Labs    03/09/23 1545 03/09/23 2041 03/10/23 0708 03/10/23 1730 03/11/23 0513 03/11/23 1450 03/11/23 2357 03/12/23 0522 03/12/23 0812  HGB  --    < > 10.9*  --  8.9*  --   --  8.7*  --   HCT  --   --  38.7  --  30.0*  --   --  30.3*  --   PLT  --   --  174  --  160  --   --  167  --   HEPARINUNFRC  --    < > 0.47   < > 0.13* 0.24* 0.27*  --  0.37  CREATININE 0.46  --   --   --  0.50  --   --  0.39*  --    < > = values in this interval not displayed.    Estimated Creatinine Clearance: 56.5 mL/min (A) (by C-G formula based on SCr of 0.39 mg/dL (L)).   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 Three variants of uncertain significance were detected - one in the AXIN2 gene called c.1829G>A, one in the PMS2 gene called c.964G>A, and one in the RNF43 gene called c.1114C>T. The report date is 06/24/2019.     The Multi-Cancer Panel offered by  [facility] includes sequencing and/or deletion duplication test   GERD (gastroesophageal reflux disease)    History of colonic polyps    Hyperlipidemia    Hypertension    Hypothyroidism    Major depressive disorder    Osteoarthritis of knee 07/20/2021   Subjective tinnitus of both ears 03/26/2019   Tick bite 08/2015   UTI (urinary tract infection) 10/2015   Assessment: 72 YO female presenting for a colonoscopy procedure 11/20 with SpO2 70-80% and slight shortness of breath. She was transferred to the [facility] ED where CTA chest revealed scattered subsegmental pulmonary emboli throughout the right lung, no RHS. She was intubated in the ED due to lack of improvement on BiPAP. No anticoagulation noted PTA. Pharmacy consulted for heparin dosing.   Significant events: -11/22 at 5pm: lumbar puncture by IR   Today, 03/12/2023: -Heparin level 0.37 - therapeutic on heparin 900 units/hr -Hgb 8.7, plts 167 - low, stable -No complications of therapy noted  Goal of Therapy:  Heparin level 0.3-0.7 units/ml Monitor platelets by anticoagulation protocol: Yes   Plan:  -Continue heparin gtt @900  units/hr -Recheck 8 hr confirmatory  heparin level -Daily CBC -Monitor for s/sx of bleeding   Cherylin Mylar, PharmD Clinical Pharmacist  11/24/20248:56 AM

## 2023-03-12 NOTE — Progress Notes (Signed)
PHARMACY - ANTICOAGULATION CONSULT NOTE  Pharmacy Consult for heparin Indication: acute pulmonary embolus  Allergies  Allergen Reactions   Zoloft [Sertraline Hcl] Anxiety and Other (See Comments)    "crazy feeling"    Patient Measurements: Height: 5\' 4"  (162.6 cm) Weight: (S) 58.8 kg (129 lb 10.1 oz) IBW/kg (Calculated) : 54.7 Heparin Dosing Weight: TBW  Vital Signs: Temp: 100.2 F (37.9 C) (11/23 2300) BP: 120/49 (11/23 2300) Pulse Rate: 85 (11/23 2300)  Labs: Recent Labs    03/09/23 0243 03/09/23 0854 03/09/23 1545 03/09/23 2041 03/10/23 0708 03/10/23 1730 03/11/23 0513 03/11/23 1450 03/11/23 2357  HGB 10.1*  --   --   --  10.9*  --  8.9*  --   --   HCT 34.5*  --   --   --  38.7  --  30.0*  --   --   PLT 191  --   --   --  174  --  160  --   --   HEPARINUNFRC 0.86*   < >  --    < > 0.47   < > 0.13* 0.24* 0.27*  CREATININE 0.49  --  0.46  --   --   --  0.50  --   --    < > = values in this interval not displayed.    Estimated Creatinine Clearance: 56.5 mL/min (by C-G formula based on SCr of 0.5 mg/dL).   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 Three variants of uncertain significance were detected - one in the AXIN2 gene called c.1829G>A, one in the PMS2 gene called c.964G>A, and one in the RNF43 gene called c.1114C>T. The report date is 06/24/2019.     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 She was transferred to the [facility] ED where CTA chest revealed scattered subsegmental pulmonary emboli throughout the right lung, no RHS. She was intubated in the ED due to lack of improvement on BiPAP. No anticoagulation noted PTA. Pharmacy consulted for heparin dosing.   Significant events: -11/22 at 5pm: lumbar puncture by IR   Today, 03/12/2023: -Heparin level = 0.27 (sub-therapeutic) despite previous increase in heparin rate, although level tontinues trending up -No complications of therapy noted   Goal of Therapy:  Heparin level 0.3-0.7 units/ml Monitor platelets by anticoagulation protocol: Yes   Plan:  -Increase heparin infusion to 900 units/hr -Recheck 8 hr heparin level -Daily CBC -Monitor  for s/sx of bleeding   Maryellen Pile, PharmD 03/12/2023 12:44 AM

## 2023-03-12 NOTE — Plan of Care (Signed)
Problem: Clinical Measurements: Goal: Ability to maintain clinical measurements within normal limits will improve Outcome: Progressing Goal: Will remain free from infection Outcome: Progressing Goal: Diagnostic test results will improve Outcome: Progressing Goal: Cardiovascular complication will be avoided Outcome: Progressing   Problem: Education: Goal: Knowledge of General Education information will improve Description: Including pain rating scale, medication(s)/side effects and non-pharmacologic comfort measures Outcome: Not Progressing   Problem: Health Behavior/Discharge Planning: Goal: Ability to manage health-related needs will improve Outcome: Not Progressing   Problem: Clinical Measurements: Goal: Respiratory complications will improve Outcome: Not Progressing

## 2023-03-12 NOTE — Progress Notes (Signed)
NAME:  Karen [name], MRN:  [id number], DOB:  10-02-1950, LOS: 4 ADMISSION DATE:  03/08/2023, CONSULTATION DATE:  03/08/23  REFERRING MD: MD Halivand CHIEF COMPLAINT:  AMS, hypoxia   History of Present Illness:  Pt is a 72 yr old female with a significant pmhx of HTN, Hypothyroidism, Major Depressive Disorder, cognitive impairment, arthritis, hearing loss, 30-40lb unintentional weight loss since 05/2022. Per patient's husband and daughter endorse that patient has had a functional and cognitive decline since the beginning of the 2024. Patient has had an extensive workup in regards to cognitive impairment and unintentional weight loss with MRI Brain, MRCPs, CT of chest and abdomen with no definitive etiology.   Patient presented to [facility] ED with AMS and hypoxia (O2 Sats in 70s-80s) by EMS from [facility], who was scheduled for a colonoscopy on 11/20. Patient did not receive colonoscopy due to hypoxia and AMS. Initial VBG revealed pH 7.17 and pCO2 > 123. Patient was given narcan during the initial ED workup with no response and subsequently placed on BIPAP due to increasing hypoxia and respiratory distress. Repeat VBG showed no improvement and patient was intubated due to hypoxia/hypercarbia. Also, CT of chest obtained by EDP showed evidence of scattered subsegmental PE and PCCM was consulted for further vent management as well as management for PE.   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   Significant Hospital Events: Including procedures, antibiotic start and stop dates in addition to other pertinent events   11/20 Admit with AMS, acute hypoxic/hypercarbic respiratory failure, subsegmental PE intubated PCCM consult 11/21 Intubated, sedated, tremors  11/22 Febrile, tachycardic, shivering/tremors seen with physical touch. IR LP obtained 11/23 Neuro consulted. MRI brain ordered  Interim History / Subjective:  MRI brain neg for acute infarct Tolerating PS  Objective   Blood pressure (!) 112/41, pulse (!) 115, temperature 100 F (37.8 C), resp. rate (!) 27, height 5\' 4"  (1.626 m), weight 58.8 kg, SpO2 94%.    Vent Mode: CPAP;PSV FiO2 (%):  [30 %-100 %] 30 % Set Rate:  [14 bmp] 14 bmp Vt Set:  [330 mL] 330 mL PEEP:  [5 cmH20] 5 cmH20 Pressure Support:  [10 cmH20] 10 cmH20 Plateau Pressure:  [13 cmH20-16 cmH20] 16 cmH20   Intake/Output Summary (Last 24 hours) at 03/12/2023 0918 Last data filed at 03/12/2023 0800 Gross per 24 hour  Intake 3970.85 ml  Output 3110 ml  Net 860.85 ml   Filed Weights   03/09/23 0500 03/11/23 2256 03/12/23 0312  Weight: 50.7 kg (S) 58.8 kg 58.8 kg   Physical Exam: General: Chronically ill and elderly-appearing, no acute distress HENT: [place], AT, ETT in place Eyes: EOMI, no scleral icterus Respiratory: Clear to auscultation bilaterally.  No crackles, wheezing or rales Cardiovascular: RRR, -M/R/G, no JVD GI: BS+, soft, nontender Extremities:-Edema,-tenderness Neuro: Eyes open, left gaze preference and not seeming able to move past midline, possible right neglect, intermittently squeezing hands, cough and gag present  Imaging, labs and test in EMR in the last 24  hours reviewed independently by me. Pertinent findings below: ABG with chronic  hypercarbic respiratory failure BMET and CBC acceptable, chronic anemia B12 965   Resolved Hospital Problem list   Hypernatremia  Hypomagnesemia  Hypophosphatemia   Assessment & Plan:  Acute hypoxemic/hypercarbic respiratory failure  Subsegmental PEs  -No clear etiology to determine cause of hypercarbia, PFTs (2019) within normal limits. Suspect hypoventilation in setting of PE likely due to immobility. Concerned for occult malignancy which patient is currently undergoing work-up as an outpatient.  P:  Full vent support LTVV, 4-8cc/kg IBW with goal Pplat<30 and DP<15 Head of bed elevated 30 degrees. Plateau pressures less than 30 cm H20.  Follow intermittent chest x-ray and ABG.   SBT/WUA daily, extubation precluded by mentation Ensure adequate pulmonary hygiene  Continue Ceftriaxone D3 Follow cultures. NTD VAP bundle in place  PAD protocol, wean as able  Continue Heparin drip    Acute Encephalopathy - hypercarbic resolved but remains altered Progressive weakness with lower extremity tremors  -In the setting of hypercarbia but recent history of cognitive impairment with memory loss and lower extremity weakness with some tremors per family    -TSH 1.145, T4 1.13 -Spot EEG negative  -LP overall unremarkable with minimally elevated protein, normal glucose, neg meningitis/encephalitis panel -MRI brain 11/23 neg for stroke P:  Appreciate Neuro consult. Labs pending. On high dose thiamine. Paraneoplastic panel on CSF Maintain neuro protective measures; goal for eurothermia, euglycemia, eunatermia, normoxia, and PCO2 goal of 35-40 Nutrition and bowel regiment  Seizure precautions  Aspirations precautions   Sedation related hypotension - low suspicion for sepsis Wean levophed for MAP goal >65 On Ceftriaxone but no clear source of infection. Dc'd acyclovir and ampicillin F/u cultures  Failure to Thrive Weight loss/Malnutrition  -BMI 19.19, 30-40lb wt loss since 2/24  P: RD  following appreciate assistance   HTN  P:  Continuous telemetry  Continue as needed hydralazine   GERD  P:  Continue PPI   Moderate protein calorie malnutrition -As evidence by mild fat depletion, moderate muscle depletion and recent weight loss P: Continue Tube feeds  Protein supplementation  RD following   Intermittent hypoglycemia  P: Start tube feeds as above  CBG checks q4  Best Practice (right click and "Reselect all SmartList Selections" daily)   Diet/type: tubefeeds DVT prophylaxis: systemic heparin Pressure ulcer(s). None present GI prophylaxis: PPI Lines: N/A Foley:  Yes, and it is still needed Code Status:  full code Last date of multidisciplinary goals of care discussion: 11/21. Full code Will need to plan for GOC today or tomorrow Critical care time:    The patient is critically ill with multiple organ systems failure and requires high complexity decision making for assessment and support, frequent evaluation and titration of therapies, application of advanced monitoring technologies and extensive interpretation of multiple databases.  Independent Critical Care Time: 60 Minutes.   Mechele Collin, M.D. [facility] Pulmonary/Critical Care Medicine 03/12/2023 9:38 AM   Please see Amion for pager number to reach on-call Pulmonary and Critical Care Team.

## 2023-03-12 NOTE — Progress Notes (Addendum)
PHARMACY - ANTICOAGULATION CONSULT NOTE  Pharmacy Consult for heparin Indication: acute pulmonary embolus  Allergies  Allergen Reactions   Zoloft [Sertraline Hcl] Anxiety and Other (See Comments)    "crazy feeling"    Patient Measurements: Height: 5\' 4"  (162.6 cm) Weight: 58.8 kg (129 lb 10.1 oz) IBW/kg (Calculated) : 54.7 Heparin Dosing Weight: TBW  Vital Signs: Temp: 100.4 F (38 C) (11/24 1500) Temp Source: Bladder (11/24 1200) BP: 93/78 (11/24 1500) Pulse Rate: 92 (11/24 1500)  Labs: Recent Labs    03/10/23 0708 03/10/23 1730 03/11/23 0513 03/11/23 1450 03/11/23 2357 03/12/23 0522 03/12/23 0812 03/12/23 1604  HGB 10.9*  --  8.9*  --   --  8.7*  --   --   HCT 38.7  --  30.0*  --   --  30.3*  --   --   PLT 174  --  160  --   --  167  --   --   HEPARINUNFRC 0.47   < > 0.13*   < > 0.27*  --  0.37 0.24*  CREATININE  --   --  0.50  --   --  0.39*  --   --    < > = values in this interval not displayed.    Estimated Creatinine Clearance: 56.5 mL/min (A) (by C-G formula based on SCr of 0.39 mg/dL (L)).   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 Three variants of uncertain significance were detected - one in the AXIN2 gene called c.1829G>A, one in the PMS2 gene called c.964G>A, and one in the RNF43 gene called c.1114C>T. The report date is 06/24/2019.     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 She was transferred to the [facility] ED where CTA chest revealed scattered subsegmental pulmonary emboli throughout the right lung, no RHS. She was intubated in the ED due to lack of improvement on BiPAP. No anticoagulation noted PTA. Pharmacy consulted for heparin dosing.   Significant events: -11/22 at 5pm: lumbar puncture by IR   Today, 03/12/2023: -Heparin level 0.24 - now subtherapeutic on heparin at 900 units/hr; previously therapeutic on same this morning -Hgb 8.7, plts 167 - low, stable -No complications of therapy noted; no line issues or pauses in heparin. Alert for line occluded a couple of times earlier in day but any potential pause seems minimal  Goal of Therapy:  Heparin level 0.3-0.7 units/ml Monitor platelets by  anticoagulation protocol: Yes   Plan:  -Increase heparin infusion to 1000 units/hr -Recheck 8 hr heparin level -Daily CBC -Monitor for s/sx of bleeding   Pricilla Riffle, PharmD, BCPS Clinical Pharmacist 03/12/2023 5:08 PM

## 2023-03-12 NOTE — Procedures (Incomplete)
Patient Name: Karen [name]  MRN: [id number]  Epilepsy Attending: Charlsie Quest  Referring Physician/Provider: Luciano Cutter, MD  Date: 03/12/2023 Duration: 03/12/2023 1042 to 1940  Patient history: 72yo F with ams and LE tremor getting eeg to evaluate for seizure  Level of alertness:  comatose  AEDs during EEG study: Propofol  Technical aspects: This EEG was obtained using a 10 lead EEG system positioned circumferentially without any parasagittal coverage (rapid EEG). Computer selected EEG is reviewed as  well as background features and all clinically significant events.  Description: EEG showed continuous generalized predominantly 5 to 7 Hz theta slowing admixed with intermittent 2-3Hz  delta slowing. Hyperventilation and photic stimulation were not performed.     ABNORMALITY - Continuous slow, generalized  IMPRESSION: This limited ceribell EEG is suggestive of moderate diffuse encephalopathy. No seizures or epileptiform discharges were seen throughout the recording.  If suspicion for interictal activity remains a concern, a conventional EEG can be considered.   [name]

## 2023-03-13 ENCOUNTER — Telehealth: Payer: Self-pay

## 2023-03-13 DIAGNOSIS — R404 Transient alteration of awareness: Secondary | ICD-10-CM

## 2023-03-13 DIAGNOSIS — J9601 Acute respiratory failure with hypoxia: Secondary | ICD-10-CM | POA: Diagnosis not present

## 2023-03-13 DIAGNOSIS — J9602 Acute respiratory failure with hypercapnia: Secondary | ICD-10-CM | POA: Diagnosis not present

## 2023-03-13 LAB — CBC
HCT: 28.5 % — ABNORMAL LOW (ref 36.0–46.0)
Hemoglobin: 8.3 g/dL — ABNORMAL LOW (ref 12.0–15.0)
MCH: 28.7 pg (ref 26.0–34.0)
MCHC: 29.1 g/dL — ABNORMAL LOW (ref 30.0–36.0)
MCV: 98.6 fL (ref 80.0–100.0)
Platelets: 183 10*3/uL (ref 150–400)
RBC: 2.89 MIL/uL — ABNORMAL LOW (ref 3.87–5.11)
RDW: 13.8 % (ref 11.5–15.5)
WBC: 7 10*3/uL (ref 4.0–10.5)
nRBC: 0 % (ref 0.0–0.2)

## 2023-03-13 LAB — BASIC METABOLIC PANEL
Anion gap: 3 — ABNORMAL LOW (ref 5–15)
BUN: 12 mg/dL (ref 8–23)
CO2: 29 mmol/L (ref 22–32)
Calcium: 8.4 mg/dL — ABNORMAL LOW (ref 8.9–10.3)
Chloride: 108 mmol/L (ref 98–111)
Creatinine, Ser: 0.33 mg/dL — ABNORMAL LOW (ref 0.44–1.00)
GFR, Estimated: >60 mL/min (ref >60)
Glucose, Bld: 120 mg/dL — ABNORMAL HIGH (ref 70–99)
Potassium: 3.6 mmol/L (ref 3.5–5.1)
Sodium: 140 mmol/L (ref 135–145)

## 2023-03-13 LAB — GLUCOSE, CAPILLARY
Glucose-Capillary: 100 mg/dL — ABNORMAL HIGH (ref 70–99)
Glucose-Capillary: 134 mg/dL — ABNORMAL HIGH (ref 70–99)
Glucose-Capillary: 120 mg/dL — ABNORMAL HIGH (ref 70–99)
Glucose-Capillary: 117 mg/dL — ABNORMAL HIGH (ref 70–99)
Glucose-Capillary: 127 mg/dL — ABNORMAL HIGH (ref 70–99)
Glucose-Capillary: 116 mg/dL — ABNORMAL HIGH (ref 70–99)

## 2023-03-13 LAB — CULTURE, BLOOD (ROUTINE X 2)
Culture: NO GROWTH
Culture: NO GROWTH
Special Requests: ADEQUATE

## 2023-03-13 LAB — HEPARIN LEVEL (UNFRACTIONATED)
Heparin Unfractionated: 0.38 [IU]/mL (ref 0.30–0.70)
Heparin Unfractionated: 0.59 [IU]/mL (ref 0.30–0.70)

## 2023-03-13 MED ORDER — FUROSEMIDE 10 MG/ML IJ SOLN
40.0000 mg | Freq: Once | INTRAMUSCULAR | Status: AC
  Administered 2023-03-13: 40 mg via INTRAVENOUS
  Filled 2023-03-13: qty 4

## 2023-03-13 NOTE — Progress Notes (Signed)
eLink Physician-Brief Progress Note Patient Name: Jakala Corleto DOB: [birth year] MRN: [id number]   Date of Service  03/13/2023  HPI/Events of Note  RN requested flexi seal order due to liquid stools.   eICU Interventions  Order placed. Caution with placement while on anticoagulation      Intervention Category Major Interventions: Respiratory failure - evaluation and management  Oretha Milch 03/13/2023, 5:41 AM

## 2023-03-13 NOTE — Telephone Encounter (Signed)
EUS cancelled and recall entered for EUS and ROV as ordered

## 2023-03-13 NOTE — Plan of Care (Signed)
  Problem: Education: Goal: Knowledge of General Education information will improve Description: Including pain rating scale, medication(s)/side effects and non-pharmacologic comfort measures Outcome: Progressing   Problem: Elimination: Goal: Will not experience complications related to bowel motility Outcome: Progressing   Problem: Pain Management: Goal: General experience of comfort will improve Outcome: Progressing   Problem: Safety: Goal: Non-violent Restraint(s) Outcome: Progressing

## 2023-03-13 NOTE — Progress Notes (Signed)
NAME:  Karen [name], MRN:  [id number], DOB:  1950-05-23, LOS: 5 ADMISSION DATE:  03/08/2023, CONSULTATION DATE:  03/08/23  REFERRING MD: MD Halivand CHIEF COMPLAINT:  AMS, hypoxia   History of Present Illness:  Pt is a 73 yr old female with a significant pmhx of HTN, Hypothyroidism, Major Depressive Disorder, cognitive impairment, arthritis, hearing loss, 30-40lb unintentional weight loss since 05/2022. Per patient's husband and daughter endorse that patient has had a functional and cognitive decline since the beginning of the 2024. Patient has had an extensive workup in regards to cognitive impairment and unintentional weight loss with MRI Brain, MRCPs, CT of chest and abdomen with no definitive etiology.   Patient presented to [facility] ED with AMS and hypoxia (O2 Sats in 70s-80s) by EMS from [facility], who was scheduled for a colonoscopy on 11/20. Patient did not receive colonoscopy due to hypoxia and AMS. Initial VBG revealed pH 7.17 and pCO2 > 123. Patient was given narcan during the initial ED workup with no response and subsequently placed on BIPAP due to increasing hypoxia and respiratory distress. Repeat VBG showed no improvement and patient was intubated due to hypoxia/hypercarbia. Also, CT of chest obtained by EDP showed evidence of scattered subsegmental PE and PCCM was consulted for further vent management as well as management for PE.   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   Significant Hospital Events: Including procedures, antibiotic start and stop dates in addition to other pertinent events   11/20 Admit with AMS, acute hypoxic/hypercarbic respiratory failure, subsegmental PE intubated PCCM consult 11/21 Intubated, sedated, tremors  11/22 Febrile, tachycardic, shivering/tremors seen with physical touch. IR LP obtained 11/23 Neuro consulted. MRI brain ordered  Interim History / Subjective:  Intermittently hypoxemic yesterday afternoon and this morning with secretions CXR with left retrocardiac opacity and mild vascular congestion  Objective   Blood pressure 126/61, pulse (!) 119, temperature (!) 100.6 F (38.1 C), resp. rate 17, height 5\' 4"  (1.626 m), weight 59.9 kg, SpO2 94%.    Vent Mode: PRVC FiO2 (%):  [30 %-100 %] 90 % Set Rate:  [14 bmp] 14 bmp Vt Set:  [330 mL] 330 mL PEEP:  [5 cmH20] 5 cmH20 Plateau Pressure:  [10 cmH20-18 cmH20] 18 cmH20   Intake/Output Summary (Last 24 hours) at 03/13/2023 0758 Last data filed at 03/13/2023 0650 Gross per 24 hour  Intake 2400.21 ml  Output 1050 ml  Net 1350.21 ml   Filed Weights   03/11/23 2256 03/12/23 0312 03/13/23 0500  Weight: (S) 58.8 kg 58.8 kg 59.9 kg   Physical Exam: General: Chronically ill-appearing, no acute distress, encephalopathic HENT: [place], AT, ETT in place Eyes: EOMI, no scleral icterus Respiratory: Diminished but clear to auscultation bilaterally.  No crackles, wheezing or rales Cardiovascular: RRR, -M/R/G, no JVD GI: BS+, soft, nontender Extremities:-Edema,-tenderness Neuro: Eyes closed, PERRL, intermittently follows commands, CNII-XII grossly intact, moves extremities x 4, cough/gag present GU: Foley in place  Imaging, labs and test in EMR in the last 24 hours  reviewed independently by  me. Pertinent findings below: BMET and CBC acceptable, chronic anemia B12 965   Resolved Hospital Problem list   Hypernatremia  Hypomagnesemia  Hypophosphatemia   Assessment & Plan:  Acute hypoxemic/hypercarbic respiratory failure  Subsegmental PEs  11/24 CXR with possible developing pneumonia and vascular congestion. Afebrile and no leukocytosis -No clear etiology to determine cause of hypercarbia, PFTs (2019) within normal limits. Suspect hypoventilation in setting of PE likely due to immobility. Concerned for occult malignancy which patient is currently undergoing work-up as an outpatient.  P:  Full vent support LTVV, 4-8cc/kg IBW with goal Pplat<30 and DP<15 Head of bed elevated 30 degrees. Plateau pressures less than 30 cm H20.  Follow intermittent chest x-ray and ABG.   SBT/WUA daily, extubation precluded by mentation Ensure adequate pulmonary hygiene  Continue Ceftriaxone D4 Follow cultures. NTD 11/25 Diurese today VAP bundle in place  PAD protocol, wean as able  Continue Heparin drip    Acute Encephalopathy - hypercarbic resolved but remains altered Progressive weakness with lower extremity tremors  -In the setting of hypercarbia but recent history of cognitive impairment with memory loss and lower extremity weakness with some tremors per family    -TSH 1.145, T4 1.13 -Spot EEG negative  -LP overall unremarkable with minimally elevated protein, normal glucose, neg meningitis/encephalitis panel -MRI brain 11/23 neg for stroke P:  Appreciate Neuro consult. Labs pending. On high dose thiamine. Paraneoplastic panel on CSF Maintain neuro protective measures; goal for eurothermia, euglycemia, eunatermia, normoxia, and PCO2 goal of 35-40 Nutrition and bowel regiment  Seizure precautions  Aspirations precautions   Sedation related hypotension - low suspicion for sepsis Wean levophed for MAP goal >65 On Ceftriaxone but no clear source of infection. Dc'd acyclovir and  ampicillin F/u cultures  Failure to Thrive Weight loss/Malnutrition  -BMI 19.19, 30-40lb wt loss since 2/24  P: RD following appreciate assistance   HTN  P:  Continuous telemetry  Continue as needed hydralazine   GERD  P:  Continue PPI   Moderate protein calorie malnutrition -As evidence by mild fat depletion, moderate muscle depletion and recent weight loss P: Continue Tube feeds  Protein supplementation  RD following   Intermittent hypoglycemia  P: Tube feeds as above  CBG checks q4  Goals of Care 11/24. Overall patient has had a decline in recent months and mental status prohibits extubation. She is showing some intermittent following of commands however remains encephalopathic and at risk for re-intubation. We discussed the difficulties in recovering after prolonged mechanical vent support and whether tracheostomy would be considered. Daughters would like to continue discussions and remain hopeful that her mental status will improve.  Best Practice (right click and "Reselect all SmartList Selections" daily)   Diet/type: tubefeeds DVT prophylaxis: systemic heparin Pressure ulcer(s). None present GI prophylaxis: PPI Lines: N/A Foley:  Yes, and it is still needed Code Status:  full code Last date of multidisciplinary goals of care discussion: 11/21. Full code Critical care time:  40 min   The patient is critically ill with multiple organ systems failure and requires high complexity decision making for assessment and support, frequent evaluation and titration of therapies, application of advanced monitoring technologies and extensive interpretation of multiple databases.  Independent Critical Care Time: 40 Minutes.   Mechele Collin, M.D. [facility] Pulmonary/Critical Care Medicine 03/13/2023 7:59 AM   Please see Amion for pager number to reach on-call Pulmonary and Critical Care Team.

## 2023-03-13 NOTE — Progress Notes (Signed)
PHARMACY - ANTICOAGULATION CONSULT NOTE  Pharmacy Consult for heparin Indication: acute pulmonary embolus  Allergies  Allergen Reactions   Zoloft [Sertraline Hcl] Anxiety and Other (See Comments)    "crazy feeling"    Patient Measurements: Height: 5\' 4"  (162.6 cm) Weight: 58.8 kg (129 lb 10.1 oz) IBW/kg (Calculated) : 54.7 Heparin Dosing Weight: TBW  Vital Signs: Temp: 100.2 F (37.9 C) (11/24 2245) Temp Source: Bladder (11/24 2100) BP: 112/59 (11/24 2200) Pulse Rate: 91 (11/24 2245)  Labs: Recent Labs    03/10/23 0708 03/10/23 1730 03/11/23 0513 03/11/23 1450 03/12/23 0522 03/12/23 0812 03/12/23 1604 03/13/23 0024  HGB 10.9*  --  8.9*  --  8.7*  --   --   --   HCT 38.7  --  30.0*  --  30.3*  --   --   --   PLT 174  --  160  --  167  --   --   --   HEPARINUNFRC 0.47   < > 0.13*   < >  --  0.37 0.24* 0.38  CREATININE  --   --  0.50  --  0.39*  --   --   --    < > = values in this interval not displayed.    Estimated Creatinine Clearance: 56.5 mL/min (A) (by C-G formula based on SCr of 0.39 mg/dL (L)).   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 Three variants of uncertain significance were detected - one in the AXIN2 gene called c.1829G>A, one in the PMS2 gene called c.964G>A, and one in the RNF43 gene called c.1114C>T. The report date is 06/24/2019.     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 She was transferred to the [facility] ED where CTA chest revealed scattered subsegmental pulmonary emboli throughout the right lung, no RHS. She was intubated in the ED due to lack of improvement on BiPAP. No anticoagulation noted PTA. Pharmacy consulted for heparin dosing.   Significant events: -11/22 at 5pm: lumbar puncture by IR   Today, 03/13/2023: -Heparin level 0.38 - therapeutic on heparin @ 1050 units/hr -No complications of therapy noted  Goal of Therapy:  Heparin level 0.3-0.7 units/ml Monitor platelets by anticoagulation protocol: Yes   Plan:  -Continue heparin infusion @ 1050 units/hr -Recheck 8 hr heparin level to confirm therapeutic dose -Daily CBC & heparin level -Monitor for s/sx of bleeding   Rease Swinson, Joselyn Glassman, PharmD  03/13/2023 2:09 AM

## 2023-03-13 NOTE — Progress Notes (Signed)
PHARMACY - ANTICOAGULATION CONSULT NOTE  Pharmacy Consult for heparin Indication: acute pulmonary embolus  Allergies  Allergen Reactions   Zoloft [Sertraline Hcl] Anxiety and Other (See Comments)    "crazy feeling"    Patient Measurements: Height: 5\' 4"  (162.6 cm) Weight: 59.9 kg (132 lb 0.9 oz) IBW/kg (Calculated) : 54.7 Heparin Dosing Weight: TBW  Vital Signs: Temp: 99.5 F (37.5 C) (11/25 0800) Temp Source: Bladder (11/25 0348) BP: 126/61 (11/25 0440) Pulse Rate: 97 (11/25 0800)  Labs: Recent Labs    03/11/23 0513 03/11/23 1450 03/12/23 0522 03/12/23 0812 03/12/23 1604 03/13/23 0024 03/13/23 0445 03/13/23 0849  HGB 8.9*  --  8.7*  --   --   --  8.3*  --   HCT 30.0*  --  30.3*  --   --   --  28.5*  --   PLT 160  --  167  --   --   --  183  --   HEPARINUNFRC 0.13*   < >  --    < > 0.24* 0.38  --  0.59  CREATININE 0.50  --  0.39*  --   --   --  0.33*  --    < > = values in this interval not displayed.    Estimated Creatinine Clearance: 56.5 mL/min (A) (by C-G formula based on SCr of 0.33 mg/dL (L)).   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 Three variants of uncertain significance were detected - one in the AXIN2 gene called c.1829G>A, one in the PMS2 gene called c.964G>A, and one in the RNF43 gene called c.1114C>T. The report date is 06/24/2019.     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 She was transferred to the [facility] ED where CTA chest revealed scattered subsegmental pulmonary emboli throughout the right lung, no RHS. She was intubated in the ED due to lack of improvement on BiPAP. No anticoagulation noted PTA. Pharmacy consulted for heparin dosing.   Significant events: -11/22 at 5pm: lumbar puncture by IR   Today, 03/13/2023: -Heparin level 0.59 - therapeutic on heparin @ 1050 units/hr - Hgb 8.3 low but stable  - Plt 183 stable  -No complications of therapy noted  Goal of Therapy:  Heparin level 0.3-0.7 units/ml Monitor platelets by anticoagulation protocol: Yes   Plan:  -Continue heparin infusion @ 1050 units/hr -Daily CBC & heparin level -Monitor for s/sx of bleeding   Shana Chute  Cydni Reddoch, PharmD, BCPS 03/13/2023 11:01 AM

## 2023-03-13 NOTE — Telephone Encounter (Signed)
-----   Message from [facility] sent at 03/13/2023  5:50 AM EST ----- CD, Thank you for letting me know. Could call to not proceed with procedure. Will put her back on the list for recall in a few months time pending how she is done, but will need to likely be seen by your APP prior to rescheduling EUS with anticoagulation needs as well.  Brunella Wileman, Remove this patient from the 12/2 list. Place a recall in the system for clinic visit with Dr. Leonides Schanz or one of the APP's in 3 months. EUS recall pushed out to 4 months. Hold the 12/2 date as I may have someone else for that date. Thanks. GM ----- Message ----- From: Imogene Burn, MD Sent: 03/10/2023   9:22 AM EST To: Lemar Lofty., MD  Morene Rankins, FYI this patient presented for colonoscopy earlier this week and was found to be hypoxic, confused, and somnolent. I opted to send her to the ED directly and did not proceed with colonoscopy procedure. Patient was found to have subsegmental PE and is currently in the ICU intubated. I think she will likely not be able to undergo EUS with you on 12/2.

## 2023-03-13 NOTE — Progress Notes (Signed)
MRI brain-no acute changes B12 normal B6 levels pending RPR nonreactive Serum paraneoplastic panel-pending-takes multiple days to come back but may need outpatient follow-up. Will need outpatient neuropsych testing. No further inpatient workup recommended at this time Plan discussed with Dr. Everardo All  -- Milon Dikes, MD Neurologist [facility]

## 2023-03-14 DIAGNOSIS — I2694 Multiple subsegmental pulmonary emboli without acute cor pulmonale: Secondary | ICD-10-CM

## 2023-03-14 DIAGNOSIS — G9340 Encephalopathy, unspecified: Secondary | ICD-10-CM | POA: Diagnosis not present

## 2023-03-14 DIAGNOSIS — J9601 Acute respiratory failure with hypoxia: Secondary | ICD-10-CM | POA: Diagnosis not present

## 2023-03-14 DIAGNOSIS — J9602 Acute respiratory failure with hypercapnia: Secondary | ICD-10-CM | POA: Diagnosis not present

## 2023-03-14 LAB — GLUCOSE, CAPILLARY
Glucose-Capillary: 127 mg/dL — ABNORMAL HIGH (ref 70–99)
Glucose-Capillary: 111 mg/dL — ABNORMAL HIGH (ref 70–99)
Glucose-Capillary: 100 mg/dL — ABNORMAL HIGH (ref 70–99)
Glucose-Capillary: 124 mg/dL — ABNORMAL HIGH (ref 70–99)
Glucose-Capillary: 106 mg/dL — ABNORMAL HIGH (ref 70–99)
Glucose-Capillary: 105 mg/dL — ABNORMAL HIGH (ref 70–99)

## 2023-03-14 LAB — CULTURE, BLOOD (ROUTINE X 2)
Culture: NO GROWTH
Special Requests: ADEQUATE

## 2023-03-14 LAB — VDRL, CSF: VDRL Quant, CSF: NONREACTIVE

## 2023-03-14 LAB — CSF CULTURE W GRAM STAIN
Culture: NO GROWTH
Gram Stain: NONE SEEN

## 2023-03-14 LAB — CBC
HCT: 25.9 % — ABNORMAL LOW (ref 36.0–46.0)
Hemoglobin: 8 g/dL — ABNORMAL LOW (ref 12.0–15.0)
MCH: 29.7 pg (ref 26.0–34.0)
MCHC: 30.9 g/dL (ref 30.0–36.0)
MCV: 96.3 fL (ref 80.0–100.0)
Platelets: 220 10*3/uL (ref 150–400)
RBC: 2.69 MIL/uL — ABNORMAL LOW (ref 3.87–5.11)
RDW: 13.5 % (ref 11.5–15.5)
WBC: 6 10*3/uL (ref 4.0–10.5)
nRBC: 0 % (ref 0.0–0.2)

## 2023-03-14 LAB — BASIC METABOLIC PANEL
Anion gap: 5 (ref 5–15)
BUN: 14 mg/dL (ref 8–23)
CO2: 32 mmol/L (ref 22–32)
Calcium: 8.9 mg/dL (ref 8.9–10.3)
Chloride: 102 mmol/L (ref 98–111)
Creatinine, Ser: 0.51 mg/dL (ref 0.44–1.00)
GFR, Estimated: >60 mL/min (ref >60)
Glucose, Bld: 147 mg/dL — ABNORMAL HIGH (ref 70–99)
Potassium: 3.4 mmol/L — ABNORMAL LOW (ref 3.5–5.1)
Sodium: 139 mmol/L (ref 135–145)

## 2023-03-14 LAB — HEPARIN LEVEL (UNFRACTIONATED): Heparin Unfractionated: 0.51 [IU]/mL (ref 0.30–0.70)

## 2023-03-14 MED ORDER — POTASSIUM CHLORIDE 20 MEQ PO PACK
20.0000 meq | PACK | ORAL | Status: AC
Start: 2023-03-14 — End: 2023-03-14
  Administered 2023-03-14 (×2): 20 meq
  Filled 2023-03-14 (×2): qty 1

## 2023-03-14 MED ORDER — POTASSIUM CHLORIDE 10 MEQ/100ML IV SOLN
10.0000 meq | INTRAVENOUS | Status: AC
  Administered 2023-03-14 (×4): 10 meq via INTRAVENOUS
  Filled 2023-03-14 (×4): qty 100

## 2023-03-14 MED ORDER — FENTANYL CITRATE PF 50 MCG/ML IJ SOSY
50.0000 ug | PREFILLED_SYRINGE | Freq: Once | INTRAMUSCULAR | Status: AC
  Administered 2023-03-14: 50 ug via INTRAVENOUS
  Filled 2023-03-14: qty 1

## 2023-03-14 MED ORDER — DONEPEZIL HCL 10 MG PO TABS
10.0000 mg | ORAL_TABLET | Freq: Every day | ORAL | Status: DC
Start: 2023-03-14 — End: 2023-03-18
  Administered 2023-03-14 – 2023-03-17 (×4): 10 mg
  Filled 2023-03-14 (×3): qty 1

## 2023-03-14 MED ORDER — FENTANYL BOLUS VIA INFUSION
25.0000 ug | INTRAVENOUS | Status: DC | PRN
  Administered 2023-03-14: 50 ug via INTRAVENOUS
  Administered 2023-03-14 (×2): 100 ug via INTRAVENOUS
  Administered 2023-03-14 (×3): 50 ug via INTRAVENOUS
  Administered 2023-03-14 (×2): 100 ug via INTRAVENOUS
  Administered 2023-03-14: 50 ug via INTRAVENOUS
  Administered 2023-03-15: 100 ug via INTRAVENOUS
  Administered 2023-03-15: 50 ug via INTRAVENOUS
  Administered 2023-03-15 (×2): 100 ug via INTRAVENOUS

## 2023-03-14 MED ORDER — ROSUVASTATIN CALCIUM 10 MG PO TABS
10.0000 mg | ORAL_TABLET | Freq: Every day | ORAL | Status: DC
Start: 2023-03-14 — End: 2023-03-18
  Administered 2023-03-14 – 2023-03-17 (×4): 10 mg
  Filled 2023-03-14 (×4): qty 1

## 2023-03-14 MED ORDER — HYDRALAZINE HCL 20 MG/ML IJ SOLN
10.0000 mg | INTRAMUSCULAR | Status: DC | PRN
Start: 2023-03-14 — End: 2023-03-26
  Administered 2023-03-14: 20 mg via INTRAVENOUS
  Administered 2023-03-17: 10 mg via INTRAVENOUS
  Administered 2023-03-17 – 2023-03-18 (×2): 20 mg via INTRAVENOUS
  Administered 2023-03-18 – 2023-03-19 (×3): 30 mg via INTRAVENOUS
  Administered 2023-03-20 – 2023-03-22 (×2): 20 mg via INTRAVENOUS
  Administered 2023-03-26: 10 mg via INTRAVENOUS
  Filled 2023-03-14: qty 2
  Filled 2023-03-14 (×3): qty 1
  Filled 2023-03-14: qty 2
  Filled 2023-03-14 (×4): qty 1
  Filled 2023-03-14: qty 2

## 2023-03-14 MED ORDER — LEVOTHYROXINE SODIUM 100 MCG PO TABS
100.0000 ug | ORAL_TABLET | Freq: Every day | ORAL | Status: DC
Start: 2023-03-15 — End: 2023-03-18
  Administered 2023-03-15 – 2023-03-18 (×4): 100 ug
  Filled 2023-03-14 (×4): qty 1

## 2023-03-14 MED ORDER — FENTANYL 2500MCG IN NS 250ML (10MCG/ML) PREMIX INFUSION
25.0000 ug/h | INTRAVENOUS | Status: DC
  Administered 2023-03-14: 25 ug/h via INTRAVENOUS
  Administered 2023-03-15: 75 ug/h via INTRAVENOUS
  Filled 2023-03-14 (×2): qty 250

## 2023-03-14 NOTE — Plan of Care (Signed)
  Problem: Clinical Measurements: Goal: Ability to maintain clinical measurements within normal limits will improve Outcome: Progressing Goal: Will remain free from infection Outcome: Progressing Goal: Diagnostic test results will improve Outcome: Progressing Goal: Respiratory complications will improve Outcome: Progressing Goal: Cardiovascular complication will be avoided Outcome: Progressing   Problem: Nutrition: Goal: Adequate nutrition will be maintained Outcome: Progressing   Problem: Elimination: Goal: Will not experience complications related to urinary retention Outcome: Progressing   Problem: Pain Management: Goal: General experience of comfort will improve Outcome: Progressing   Problem: Safety: Goal: Ability to remain free from injury will improve Outcome: Progressing   Problem: Skin Integrity: Goal: Risk for impaired skin integrity will decrease Outcome: Progressing

## 2023-03-14 NOTE — Progress Notes (Signed)
PHARMACY - ANTICOAGULATION CONSULT NOTE  Pharmacy Consult for heparin Indication: acute pulmonary embolus  Allergies  Allergen Reactions   Zoloft [Sertraline Hcl] Anxiety and Other (See Comments)    "crazy feeling"    Patient Measurements: Height: 5\' 4"  (162.6 cm) Weight: 60.7 kg (133 lb 13.1 oz) IBW/kg (Calculated) : 54.7 Heparin Dosing Weight: TBW  Vital Signs: Temp: 99.5 F (37.5 C) (11/26 0445) Temp Source: Axillary (11/26 0445) Pulse Rate: 98 (11/26 0645)  Labs: Recent Labs    03/12/23 0522 03/12/23 0865 03/13/23 0024 03/13/23 0445 03/13/23 0849 03/14/23 0214 03/14/23 0403  HGB 8.7*  --   --  8.3*  --  8.0*  --   HCT 30.3*  --   --  28.5*  --  25.9*  --   PLT 167  --   --  183  --  220  --   HEPARINUNFRC  --    < > 0.38  --  0.59  --  0.51  CREATININE 0.39*  --   --  0.33*  --  0.51  --    < > = values in this interval not displayed.    Estimated Creatinine Clearance: 56.5 mL/min (by C-G formula based on SCr of 0.51 mg/dL).   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 Three variants of uncertain significance were detected - one in the AXIN2 gene called c.1829G>A, one in the PMS2 gene called c.964G>A, and one in the RNF43 gene called c.1114C>T. The report date is 06/24/2019.     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 She was transferred to the [facility] ED where CTA chest revealed scattered subsegmental pulmonary emboli throughout the right lung, no RHS. She was intubated in the ED due to lack of improvement on BiPAP. No anticoagulation noted PTA. Pharmacy consulted for heparin dosing.   Significant events: -11/22 at 5pm: lumbar puncture by IR   Today, 03/14/2023: -Heparin level 0.51 - remains therapeutic on heparin infusion at 1050 units/hr -Hgb low but stable; plt stable  -No complications of therapy noted  Goal of Therapy:  Heparin level 0.3-0.7 units/ml Monitor platelets by anticoagulation protocol: Yes   Plan:  -Continue heparin infusion at 1050 units/hr -Daily CBC & heparin level -Monitor for s/sx of bleeding   Pricilla Riffle, PharmD, BCPS Clinical Pharmacist 03/14/2023 7:29 AM

## 2023-03-14 NOTE — Progress Notes (Signed)
NAME:  Karen [name], MRN:  [id number], DOB:  06/23/1950, LOS: 6 ADMISSION DATE:  03/08/2023, CONSULTATION DATE:  03/08/23  REFERRING MD: MD Halivand CHIEF COMPLAINT:  AMS, hypoxia   History of Present Illness:  Pt is a 72 yr old female with a significant pmh of HTN, Hypothyroidism, Major Depressive Disorder, cognitive impairment, arthritis, hearing loss, 30-40lb unintentional weight loss since 05/2022. Per patient's husband and daughter endorse that patient has had a functional and cognitive decline since the beginning of the 2024. Patient has had an extensive workup in regards to cognitive impairment and unintentional weight loss with MRI Brain, MRCPs, CT of chest and abdomen with no definitive etiology.   Patient presented to [facility] ED with AMS and hypoxia (O2 Sats in 70s-80s) by EMS from [facility], who was scheduled for a colonoscopy on 11/20. Patient did not receive colonoscopy due to hypoxia and AMS. Initial VBG revealed pH 7.17 and pCO2 > 123. Patient was given narcan during the initial ED workup with no response and subsequently placed on BIPAP due to increasing hypoxia and respiratory distress. Repeat VBG showed no improvement and patient was intubated due to hypoxia/hypercarbia. Also, CT of chest obtained by EDP showed evidence of scattered subsegmental PE and PCCM was consulted for further vent management as well as management for PE.   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   Significant Hospital Events: Including procedures, antibiotic start and stop dates in addition to other pertinent events   11/20 Admit with AMS, acute hypoxic/hypercarbic respiratory failure, subsegmental PE intubated PCCM consult 11/21 Intubated, sedated, tremors  11/22 Febrile, tachycardic, shivering/tremors seen with physical touch. IR LP obtained 11/23 Neuro consulted. MRI brain ordered  Interim History / Subjective:   This am weaned off sedation and opens eyes and following some commands; weaned off levo On vent 60% fio2; failed SBT due to low vt and high RR  Objective   Blood pressure 126/61, pulse 98, temperature 100.2 F (37.9 C), temperature source Axillary, resp. rate 16, height 5\' 4"  (1.626 m), weight 60.7 kg, SpO2 100%.    Vent Mode: PRVC FiO2 (%):  [40 %-80 %] 40 % Set Rate:  [14 bmp] 14 bmp Vt Set:  [330 mL] 330 mL PEEP:  [5 cmH20] 5 cmH20 Pressure Support:  [10 cmH20] 10 cmH20 Plateau Pressure:  [14 cmH20-16 cmH20] 15 cmH20   Intake/Output Summary (Last 24 hours) at 03/14/2023 0908 Last data filed at 03/14/2023 0800 Gross per 24 hour  Intake 2744.5 ml  Output 2935 ml  Net -190.5 ml   Filed Weights   03/12/23 0312 03/13/23 0500 03/14/23 0358  Weight: 58.8 kg 59.9 kg 60.7 kg   Physical Exam: General: critically ill appearing on mech vent HEENT: MM pink/moist; ETT in place Neuro: sedation turned off this am; opens eyes to voice; wiggle toes and gives thumbs up b/l; weak cough/gag; perrl CV: s1s2, RRR, no m/r/g PULM:  dim clear BS bilaterally; on mech vent PRVC GI: soft, bsx4 active  Extremities: warm/dry,  no edema  Skin: no rashes or lesions    Resolved Hospital Problem list   Hypernatremia  Hypomagnesemia  Hypophosphatemia   Assessment & Plan:  Acute  hypoxemic/hypercarbic respiratory failure  Subsegmental PEs  11/24 CXR with possible developing pneumonia and vascular congestion. Afebrile and no leukocytosis -No clear etiology to determine cause of hypercarbia, PFTs (2019) within normal limits. Suspect hypoventilation in setting of PE likely due to immobility. Concerned for occult malignancy which patient is currently undergoing work-up as an outpatient.  P:  -LTVV strategy with tidal volumes of 6-8 cc/kg ideal body weight -Wean PEEP/FiO2 for SpO2 >92% -VAP bundle in place -Daily SAT and SBT -PAD protocol in place -wean sedation for RASS goal 0 to -1 -Follow intermittent CXR and ABG PRN -cont rocephin; follow trach culture 11/25 -cont heparin drip  Acute Encephalopathy - hypercarbic resolved but remains altered Progressive weakness with lower extremity tremors  -In the setting of hypercarbia but recent history of cognitive impairment with memory loss and lower extremity weakness with some tremors per family    -TSH 1.145, T4 1.13 -Spot EEG negative  -LP overall unremarkable with minimally elevated protein, normal glucose, neg meningitis/encephalitis panel -MRI brain 11/23 neg for stroke P:  -neuro following; appreciate recs -limit sedating meds -cont high dose thiamine -paraneoplastic panel and b6 levels pending -wean sedation for RASS 0 to -1 -resume home aricept  Sedation related hypotension - low suspicion for sepsis P: -intermittently on levo; wean for map goal >65 -wean sedation for RASS 0 to -1 -cont rocephin as above -follow cultures  Failure to Thrive Weight loss Moderate protein calorie malnutrition -As evidence by mild fat depletion, moderate muscle depletion and recent weight los -BMI 19.19, 30-40lb wt loss since 2/24  P: -TF per RD  HTN  HLD P:  -hold home anti-hypertensive's w/ hypotension  GERD  P:  -PPI   Intermittent hypoglycemia  P: -cont TF -cbg monitoring  Hypothyroidism Plan: -resume  home synthroid  Depression/anxiety Plan: -hold home ativan, hydroxyzine, effexor, remeron  Goals of Care 11/24. Overall patient has had a decline in recent months and mental status prohibits extubation. She is showing some intermittent following of commands however remains encephalopathic and at risk for re-intubation. We discussed the difficulties in recovering after prolonged mechanical vent support and whether tracheostomy would be considered. Daughters would like to continue discussions and remain hopeful that her mental status will improve.  Best Practice (right click and "Reselect all SmartList Selections" daily)   Diet/type: tubefeeds DVT prophylaxis: systemic heparin Pressure ulcer(s). None present GI prophylaxis: PPI Lines: N/A Foley:  Yes, and it is still needed Code Status:  full code Last date of multidisciplinary goals of care discussion: 11/26 updated son at bedside and discussed if unable to get patient off vent would family want to pursue tracheostomy. He said he would have to talk w/ sisters. Will continue to have ongoing GOC conversations  Critical care time:  35 min   JD Anselm Lis [facility] 03/14/2023, 10:23 AM  Please see [email or web address] for pager details.  From 7A-7P if no response, please call [phone]. After hours, please call ELink [phone].

## 2023-03-14 NOTE — Progress Notes (Signed)
[facility] ADULT ICU REPLACEMENT PROTOCOL   The patient does apply for the [name] Adult ICU Electrolyte Replacment Protocol based on the criteria listed below:   1.Exclusion criteria: TCTS, ECMO, Dialysis, and Myasthenia Gravis patients 2. Is GFR >/= 30 ml/min? Yes.    Patient's GFR today is >60 3. Is SCr </= 2? Yes.   Patient's SCr is 0.51 mg/dL 4. Did SCr increase >/= 0.5 in 24 hours? No. 5.Pt's weight >40kg  Yes.   6. Abnormal electrolyte(s): K 3.4  7. Electrolytes replaced per protocol 8.  Call MD STAT for K+ </= 2.5, Phos </= 1, or Mag </= 1 Physician:    Markus Daft A 03/14/2023 2:50 AM

## 2023-03-14 NOTE — Plan of Care (Signed)
  Problem: Activity: Goal: Risk for activity intolerance will decrease Outcome: Progressing   Problem: Coping: Goal: Level of anxiety will decrease Outcome: Progressing   Problem: Safety: Goal: Non-violent Restraint(s) Outcome: Progressing

## 2023-03-15 DIAGNOSIS — G9340 Encephalopathy, unspecified: Secondary | ICD-10-CM | POA: Diagnosis not present

## 2023-03-15 DIAGNOSIS — J9602 Acute respiratory failure with hypercapnia: Secondary | ICD-10-CM | POA: Diagnosis not present

## 2023-03-15 DIAGNOSIS — J9601 Acute respiratory failure with hypoxia: Secondary | ICD-10-CM | POA: Diagnosis not present

## 2023-03-15 LAB — VITAMIN B6: Vitamin B6: 1.9 ug/L — ABNORMAL LOW (ref 3.4–65.2)

## 2023-03-15 LAB — BASIC METABOLIC PANEL
Anion gap: 6 (ref 5–15)
BUN: 11 mg/dL (ref 8–23)
CO2: 31 mmol/L (ref 22–32)
Calcium: 9 mg/dL (ref 8.9–10.3)
Chloride: 103 mmol/L (ref 98–111)
Creatinine, Ser: 0.33 mg/dL — ABNORMAL LOW (ref 0.44–1.00)
GFR, Estimated: >60 mL/min (ref >60)
Glucose, Bld: 131 mg/dL — ABNORMAL HIGH (ref 70–99)
Potassium: 4 mmol/L (ref 3.5–5.1)
Sodium: 140 mmol/L (ref 135–145)

## 2023-03-15 LAB — HEPARIN LEVEL (UNFRACTIONATED): Heparin Unfractionated: 0.42 [IU]/mL (ref 0.30–0.70)

## 2023-03-15 LAB — GLUCOSE, CAPILLARY
Glucose-Capillary: 113 mg/dL — ABNORMAL HIGH (ref 70–99)
Glucose-Capillary: 114 mg/dL — ABNORMAL HIGH (ref 70–99)
Glucose-Capillary: 149 mg/dL — ABNORMAL HIGH (ref 70–99)
Glucose-Capillary: 113 mg/dL — ABNORMAL HIGH (ref 70–99)
Glucose-Capillary: 97 mg/dL (ref 70–99)

## 2023-03-15 LAB — CBC
HCT: 27.4 % — ABNORMAL LOW (ref 36.0–46.0)
Hemoglobin: 8 g/dL — ABNORMAL LOW (ref 12.0–15.0)
MCH: 29 pg (ref 26.0–34.0)
MCHC: 29.2 g/dL — ABNORMAL LOW (ref 30.0–36.0)
MCV: 99.3 fL (ref 80.0–100.0)
Platelets: 257 10*3/uL (ref 150–400)
RBC: 2.76 MIL/uL — ABNORMAL LOW (ref 3.87–5.11)
RDW: 13.5 % (ref 11.5–15.5)
WBC: 8.7 10*3/uL (ref 4.0–10.5)
nRBC: 0 % (ref 0.0–0.2)

## 2023-03-15 MED ORDER — FENTANYL CITRATE PF 50 MCG/ML IJ SOSY
25.0000 ug | PREFILLED_SYRINGE | INTRAMUSCULAR | Status: DC | PRN
Start: 2023-03-15 — End: 2023-03-19
  Administered 2023-03-16: 25 ug via INTRAVENOUS

## 2023-03-15 MED ORDER — MIRTAZAPINE 15 MG PO TABS
30.0000 mg | ORAL_TABLET | Freq: Every day | ORAL | Status: DC
  Administered 2023-03-15 – 2023-03-17 (×3): 30 mg
  Filled 2023-03-15 (×3): qty 2

## 2023-03-15 MED ORDER — VITAMIN B-6 100 MG PO TABS
100.0000 mg | ORAL_TABLET | Freq: Every day | ORAL | Status: DC
  Administered 2023-03-15 – 2023-03-17 (×3): 100 mg
  Filled 2023-03-15 (×4): qty 1

## 2023-03-15 MED ORDER — HYDROXYZINE HCL 25 MG PO TABS
12.5000 mg | ORAL_TABLET | Freq: Every day | ORAL | Status: DC
  Administered 2023-03-15 – 2023-03-17 (×3): 12.5 mg
  Filled 2023-03-15 (×3): qty 1

## 2023-03-15 MED ORDER — FENTANYL CITRATE PF 50 MCG/ML IJ SOSY
25.0000 ug | PREFILLED_SYRINGE | INTRAMUSCULAR | Status: DC | PRN
  Administered 2023-03-15: 100 ug via INTRAVENOUS
  Administered 2023-03-15: 50 ug via INTRAVENOUS
  Administered 2023-03-15: 100 ug via INTRAVENOUS
  Administered 2023-03-16: 50 ug via INTRAVENOUS
  Administered 2023-03-16 (×3): 100 ug via INTRAVENOUS
  Administered 2023-03-16: 50 ug via INTRAVENOUS
  Administered 2023-03-16 – 2023-03-17 (×10): 100 ug via INTRAVENOUS
  Administered 2023-03-18 (×2): 50 ug via INTRAVENOUS
  Administered 2023-03-18 – 2023-03-19 (×2): 100 ug via INTRAVENOUS
  Administered 2023-03-19: 50 ug via INTRAVENOUS
  Administered 2023-03-19: 100 ug via INTRAVENOUS
  Administered 2023-03-19: 50 ug via INTRAVENOUS
  Filled 2023-03-15 (×2): qty 2
  Filled 2023-03-15 (×3): qty 1
  Filled 2023-03-15 (×3): qty 2
  Filled 2023-03-15: qty 1
  Filled 2023-03-15 (×3): qty 2
  Filled 2023-03-15: qty 1
  Filled 2023-03-15 (×7): qty 2
  Filled 2023-03-15: qty 1
  Filled 2023-03-15 (×5): qty 2
  Filled 2023-03-15: qty 1
  Filled 2023-03-15 (×2): qty 2

## 2023-03-15 NOTE — Progress Notes (Signed)
Nutrition Follow-up  DOCUMENTATION CODES:   Non-severe (moderate) malnutrition in context of chronic illness  INTERVENTION:  - Continue goal TF regimen: Vital 1.2 at 55 ml/h  Provides 1584 kcal, 99 gm protein, 1070 ml free water daily    - FWF per CCM/MD  - Monitor weight trends.   NUTRITION DIAGNOSIS:   Moderate Malnutrition related to chronic illness as evidenced by mild fat depletion, moderate muscle depletion, percent weight loss (23% in 9 months). *ongoing  GOAL:   Patient will meet greater than or equal to 90% of their needs *met with TF  MONITOR:   Vent status, Labs, Weight trends, TF tolerance  REASON FOR ASSESSMENT:   Consult Enteral/tube feeding initiation and management  ASSESSMENT:   72 yr old female with PMH significant of HTN, Hypothyroidism, MDD, cognitive impairment, arthritis who presented with AMS and hypoxia from [facility] after being scheduled for a colonoscopy.  11/20 Admit; Intubated  11/21 Vit 1.2 started at 104mL/hr 11/23 Reached goal of 95mL/hr  Patient remains intubated and sedated. Tube feed infusing at goal of 37mL/hr at time of visit.   Daughter at bedside. No questions or concerns.    Admit weight: 122# Current weight: 140# I&O's: +8L + mild pitting RUE/LUE and RLE/LLE edema  Medications reviewed and include: Colace, Miralax, Thiamine (high dose - 500mg  Q8H x2 days, then 250mg  daily x5 days, then 100mg  daily) Precedex Fentanyl Levophed @ 63mcg/min  Labs reviewed:  -   Diet Order:   Diet Order             Diet NPO time specified  Diet effective now                   EDUCATION NEEDS:  Education needs have been addressed  Skin:  Skin Assessment: Reviewed RN Assessment  Last BM:  11/26 - type 7 - rectal tube  Height:  Ht Readings from Last 1 Encounters:  03/09/23 5\' 4"  (1.626 m)   Weight:  Wt Readings from Last 1 Encounters:  03/15/23 63.9 kg    BMI:  Body mass index is 24.18  kg/m.  Estimated Nutritional Needs:  Kcal:  1550-1750 Protein:  85-100g Fluid:  1.7L/day    Shelle Iron RD, LDN For contact information, refer to [facility].

## 2023-03-15 NOTE — Plan of Care (Signed)
Problem: Nutrition: Goal: Adequate nutrition will be maintained Outcome: Progressing   Problem: Safety: Goal: Ability to remain free from injury will improve Outcome: Progressing   Problem: Safety: Goal: Non-violent Restraint(s) Outcome: Progressing

## 2023-03-15 NOTE — TOC Progression Note (Signed)
Transition of Care [facility]) - Progression Note    Patient Details  Name: Karen [name] MRN: [id number] Date of Birth: 12-23-1950  Transition of Care [name]) CM/SW Contact  Lavenia Atlas, RN Phone Number: 03/15/2023, 6:54 PM  Clinical Narrative:   Per chart review patient remains sedated/intubated on vent.  TOC following for discharge needs     Expected Discharge Plan: Home/Self Care Barriers to Discharge: Continued Medical Work up  Expected Discharge Plan and Services   Discharge Planning Services: CM Consult   Living arrangements for the past 2 months: Single Family Home                                       Social Determinants of Health (SDOH) Interventions SDOH Screenings   Food Insecurity: No Food Insecurity (03/09/2023)  Housing: Patient Unable To Answer (03/09/2023)  Transportation Needs: No Transportation Needs (03/09/2023)  Utilities: Not At Risk (03/09/2023)  Depression (PHQ2-9): Low Risk  (03/23/2022)  Tobacco Use: Medium Risk (03/08/2023)    Readmission Risk Interventions    03/09/2023    1:14 PM  Readmission Risk Prevention Plan  Transportation Screening Complete  PCP or Specialist Appt within 5-7 Days Complete  Home Care Screening Complete  Medication Review (RN CM) Complete

## 2023-03-15 NOTE — Progress Notes (Signed)
PHARMACY - ANTICOAGULATION CONSULT NOTE  Pharmacy Consult for heparin Indication: acute pulmonary embolus  Allergies  Allergen Reactions   Zoloft [Sertraline Hcl] Anxiety and Other (See Comments)    "crazy feeling"    Patient Measurements: Height: 5\' 4"  (162.6 cm) Weight: 63.9 kg (140 lb 14 oz) IBW/kg (Calculated) : 54.7 Heparin Dosing Weight: TBW  Vital Signs: Temp: 100.1 F (37.8 C) (11/27 0404) Temp Source: Bladder (11/27 0404) BP: 124/66 (11/26 2300) Pulse Rate: 115 (11/27 0000)  Labs: Recent Labs    03/13/23 0445 03/13/23 0849 03/14/23 0214 03/14/23 0403 03/15/23 0454  HGB 8.3*  --  8.0*  --  8.0*  HCT 28.5*  --  25.9*  --  27.4*  PLT 183  --  220  --  257  HEPARINUNFRC  --  0.59  --  0.51 0.42  CREATININE 0.33*  --  0.51  --  0.33*    Estimated Creatinine Clearance: 56.5 mL/min (A) (by C-G formula based on SCr of 0.33 mg/dL (L)).   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 Three variants of uncertain significance were detected - one in the AXIN2 gene called c.1829G>A, one in the PMS2 gene called c.964G>A, and one in the RNF43 gene called c.1114C>T. The report date is 06/24/2019.     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 She was transferred to the [facility] ED where CTA chest revealed scattered subsegmental pulmonary emboli throughout the right lung, no RHS. She was intubated in the ED due to lack of improvement on BiPAP. No anticoagulation noted PTA. Pharmacy consulted for heparin dosing.   Significant events: -11/22 at 5pm: lumbar puncture by IR   Today, 03/15/2023: -Heparin level 0.42 - remains therapeutic on heparin infusion at 1050 units/hr -Hgb low but stable; plt stable  -No complications of therapy noted  Goal of Therapy:  Heparin level 0.3-0.7 units/ml Monitor platelets by anticoagulation protocol: Yes   Plan:  -Continue heparin infusion at 1050 units/hr -Daily CBC & heparin level -Monitor for s/sx of bleeding   Pricilla Riffle, PharmD, BCPS Clinical Pharmacist 03/15/2023 7:19 AM

## 2023-03-15 NOTE — Progress Notes (Signed)
NAME:  Karen [name], MRN:  [id number], DOB:  08/06/1950, LOS: 7 ADMISSION DATE:  03/08/2023, CONSULTATION DATE:  03/08/23  REFERRING MD: MD Halivand CHIEF COMPLAINT:  AMS, hypoxia   History of Present Illness:  Pt is a 72 yr old female with a significant pmh of HTN, Hypothyroidism, Major Depressive Disorder, cognitive impairment, arthritis, hearing loss, 30-40lb unintentional weight loss since 05/2022. Per patient's husband and daughter endorse that patient has had a functional and cognitive decline since the beginning of the 2024. Patient has had an extensive workup in regards to cognitive impairment and unintentional weight loss with MRI Brain, MRCPs, CT of chest and abdomen with no definitive etiology.   Patient presented to [facility] ED with AMS and hypoxia (O2 Sats in 70s-80s) by EMS from [facility], who was scheduled for a colonoscopy on 11/20. Patient did not receive colonoscopy due to hypoxia and AMS. Initial VBG revealed pH 7.17 and pCO2 > 123. Patient was given narcan during the initial ED workup with no response and subsequently placed on BIPAP due to increasing hypoxia and respiratory distress. Repeat VBG showed no improvement and patient was intubated due to hypoxia/hypercarbia. Also, CT of chest obtained by EDP showed evidence of scattered subsegmental PE and PCCM was consulted for further vent management as well as management for PE.   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   Significant Hospital Events: Including procedures, antibiotic start and stop dates in addition to other pertinent events   11/20 Admit with AMS, acute hypoxic/hypercarbic respiratory failure, subsegmental PE intubated PCCM consult 11/21 Intubated, sedated, tremors  11/22 Febrile, tachycardic, shivering/tremors seen with physical touch. IR LP obtained 11/23 Neuro consulted. MRI brain ordered  Interim History / Subjective:   Fio2 50% on vent; placed on psv 12/5 w/ volumes 300 Requires intermittent sedation due to agitation; currently off and patient is sedate this am  Objective   Blood pressure 124/66, pulse 91, temperature (!) 96.5 F (35.8 C), temperature source Other (Comment), resp. rate 18, height 5\' 4"  (1.626 m), weight 63.9 kg, SpO2 100%.    Vent Mode: PRVC FiO2 (%):  [50 %-70 %] 50 % Set Rate:  [14 bmp] 14 bmp Vt Set:  [330 mL] 330 mL PEEP:  [5 cmH20] 5 cmH20 Plateau Pressure:  [16 cmH20-20 cmH20] 18 cmH20   Intake/Output Summary (Last 24 hours) at 03/15/2023 1034 Last data filed at 03/15/2023 1008 Gross per 24 hour  Intake 2409.85 ml  Output 1975 ml  Net 434.85 ml   Filed Weights   03/13/23 0500 03/14/23 0358 03/15/23 0500  Weight: 59.9 kg 60.7 kg 63.9 kg   Physical Exam: General: critically ill appearing on mech vent HEENT: MM pink/moist; ETT in place Neuro: sedation just turned off; still sedate not following commands; weak cough/gag; perrl CV: s1s2, RRR, no m/r/g PULM:  dim clear BS bilaterally; on mech vent PSV GI: soft, bsx4 active  Extremities: warm/dry, no edema  Skin: no rashes or lesions    Resolved Hospital Problem list  Hypernatremia  Hypomagnesemia  Hypophosphatemia   Assessment & Plan:  Acute hypoxemic/hypercarbic respiratory failure  Subsegmental PEs   11/24 CXR with possible developing pneumonia and vascular congestion. Afebrile and no leukocytosis -No clear etiology to determine cause of hypercarbia, PFTs (2019) within normal limits. Suspect hypoventilation in setting of PE likely due to immobility. Concerned for occult malignancy which patient is currently undergoing work-up as an outpatient.  P:  -will attempt SAT/SBT this am; still altered and taking shallow respirations so likely will hold on extubation today -rest on PRVC overnight and as needed; LTVV strategy with tidal volumes of 6-8 cc/kg ideal body weight -Wean PEEP/FiO2 for SpO2 >92% -VAP bundle in place -Daily SAT and SBT -PAD protocol in place -wean sedation for RASS goal 0 to -1 -Follow intermittent CXR and ABG PRN -rocephin stopped yesterday; follow trach culture 11/25; trend wbc/fever curve -cont heparin drip  Acute Encephalopathy - hypercarbic resolved but remains altered Progressive weakness with lower extremity tremors  -In the setting of hypercarbia but recent history of cognitive impairment with memory loss and lower extremity weakness with some tremors per family    -TSH 1.145, T4 1.13 -Spot EEG negative  -LP overall unremarkable with minimally elevated protein, normal glucose, neg meningitis/encephalitis panel -MRI brain 11/23 neg for stroke P:  -neuro following; appreciate recs -limit sedating meds -cont high dose thiamine -B6 level low at 1.9; will replete -paraneoplastic panel pending -wean sedation for RASS 0 to -1 -resume home aricept  Sedation related hypotension - low suspicion for sepsis P: -intermittently on levo; wean for map goal >65 -wean sedation for RASS 0 to -1 -rocephin stopped yesterday; monitor wbc/fever curve off abx -follow cultures  Failure to Thrive Weight loss Moderate protein calorie malnutrition -As evidence by mild fat depletion, moderate muscle depletion and recent weight los -BMI 19.19, 30-40lb wt loss since 2/24   P: -TF per RD  HTN  HLD P:  -hold home anti-hypertensive's w/ hypotension -occasionally becomes hypertensive off sedation; prn hydralazine as needed  GERD  P:  -PPI   Intermittent hypoglycemia  P: -cont TF -cbg monitoring  Hypothyroidism Plan: -resume home synthroid  Depression/anxiety Plan: -given patient requiring multiple prn doses of sedation for agitation will resume home remeron and hydroxyzine -hold home ativan  Goals of Care 11/24. Overall patient has had a decline in recent months and mental status prohibits extubation. She is showing some intermittent following of commands however remains encephalopathic and at risk for re-intubation. We discussed the difficulties in recovering after prolonged mechanical vent support and whether tracheostomy would be considered. Daughters would like to continue discussions and remain hopeful that her mental status will improve.  Best Practice (right click and "Reselect all SmartList Selections" daily)   Diet/type: tubefeeds DVT prophylaxis: systemic heparin Pressure ulcer(s). None present GI prophylaxis: PPI Lines: N/A Foley:  Yes, and it is still needed Code Status:  full code Last date of multidisciplinary goals of care discussion: 11/27 updated daughter at bedside  Critical care time:  35 min   JD Anselm Lis [facility] 03/15/2023, 10:34 AM  Please see [email or web address] for pager details.  From 7A-7P if no response, please call [phone]. After hours, please call ELink [phone].

## 2023-03-15 NOTE — Plan of Care (Signed)
Problem: Education: Goal: Knowledge of General Education information will improve Description: Including pain rating scale, medication(s)/side effects and non-pharmacologic comfort measures Outcome: Not Progressing   Problem: Health Behavior/Discharge Planning: Goal: Ability to manage health-related needs will improve Outcome: Not Progressing

## 2023-03-16 DIAGNOSIS — J9602 Acute respiratory failure with hypercapnia: Secondary | ICD-10-CM | POA: Diagnosis not present

## 2023-03-16 DIAGNOSIS — G9340 Encephalopathy, unspecified: Secondary | ICD-10-CM | POA: Diagnosis not present

## 2023-03-16 DIAGNOSIS — J9601 Acute respiratory failure with hypoxia: Secondary | ICD-10-CM | POA: Diagnosis not present

## 2023-03-16 LAB — BASIC METABOLIC PANEL
Anion gap: 7 (ref 5–15)
BUN: 11 mg/dL (ref 8–23)
CO2: 34 mmol/L — ABNORMAL HIGH (ref 22–32)
Calcium: 9.7 mg/dL (ref 8.9–10.3)
Chloride: 102 mmol/L (ref 98–111)
Creatinine, Ser: <0.3 mg/dL — ABNORMAL LOW (ref 0.44–1.00)
Glucose, Bld: 120 mg/dL — ABNORMAL HIGH (ref 70–99)
Potassium: 4.2 mmol/L (ref 3.5–5.1)
Sodium: 143 mmol/L (ref 135–145)

## 2023-03-16 LAB — CBC
HCT: 27.4 % — ABNORMAL LOW (ref 36.0–46.0)
Hemoglobin: 8.3 g/dL — ABNORMAL LOW (ref 12.0–15.0)
MCH: 29.5 pg (ref 26.0–34.0)
MCHC: 30.3 g/dL (ref 30.0–36.0)
MCV: 97.5 fL (ref 80.0–100.0)
Platelets: 292 10*3/uL (ref 150–400)
RBC: 2.81 MIL/uL — ABNORMAL LOW (ref 3.87–5.11)
RDW: 13.6 % (ref 11.5–15.5)
WBC: 8.2 10*3/uL (ref 4.0–10.5)
nRBC: 0 % (ref 0.0–0.2)

## 2023-03-16 LAB — GLUCOSE, CAPILLARY
Glucose-Capillary: 114 mg/dL — ABNORMAL HIGH (ref 70–99)
Glucose-Capillary: 114 mg/dL — ABNORMAL HIGH (ref 70–99)
Glucose-Capillary: 118 mg/dL — ABNORMAL HIGH (ref 70–99)
Glucose-Capillary: 121 mg/dL — ABNORMAL HIGH (ref 70–99)
Glucose-Capillary: 125 mg/dL — ABNORMAL HIGH (ref 70–99)
Glucose-Capillary: 131 mg/dL — ABNORMAL HIGH (ref 70–99)
Glucose-Capillary: 142 mg/dL — ABNORMAL HIGH (ref 70–99)

## 2023-03-16 LAB — HEPARIN LEVEL (UNFRACTIONATED): Heparin Unfractionated: 0.47 [IU]/mL (ref 0.30–0.70)

## 2023-03-16 MED ORDER — VANCOMYCIN HCL IN DEXTROSE 1-5 GM/200ML-% IV SOLN
1000.0000 mg | INTRAVENOUS | Status: DC
  Administered 2023-03-17 – 2023-03-19 (×3): 1000 mg via INTRAVENOUS
  Filled 2023-03-16 (×3): qty 200

## 2023-03-16 MED ORDER — VANCOMYCIN HCL 1500 MG/300ML IV SOLN
1500.0000 mg | Freq: Once | INTRAVENOUS | Status: AC
  Administered 2023-03-16: 1500 mg via INTRAVENOUS
  Filled 2023-03-16: qty 300

## 2023-03-16 NOTE — Progress Notes (Signed)
PHARMACY - ANTICOAGULATION CONSULT NOTE  Pharmacy Consult for heparin Indication: acute pulmonary embolus  Allergies  Allergen Reactions   Zoloft [Sertraline Hcl] Anxiety and Other (See Comments)    "crazy feeling"    Patient Measurements: Height: 5\' 4"  (162.6 cm) Weight: 63.9 kg (140 lb 14 oz) IBW/kg (Calculated) : 54.7 Heparin Dosing Weight: TBW  Vital Signs: Temp: 98.9 F (37.2 C) (11/28 0354) Temp Source: Bladder (11/28 0354) Pulse Rate: 108 (11/28 0417)  Labs: Recent Labs    03/14/23 0214 03/14/23 0403 03/15/23 0454 03/16/23 0504  HGB 8.0*  --  8.0* 8.3*  HCT 25.9*  --  27.4* 27.4*  PLT 220  --  257 292  HEPARINUNFRC  --  0.51 0.42 0.47  CREATININE 0.51  --  0.33*  --     Estimated Creatinine Clearance: 56.5 mL/min (A) (by C-G formula based on SCr of 0.33 mg/dL (L)).   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 Three variants of uncertain significance were detected - one in the AXIN2 gene called c.1829G>A, one in the PMS2 gene called c.964G>A, and one in the RNF43 gene called c.1114C>T. The report date is 06/24/2019.     The Multi-Cancer Panel offered by [facility] includes sequencing and/or deletion duplication test   GERD (gastroesophageal reflux disease)    History of colonic polyps    Hyperlipidemia    Hypertension    Hypothyroidism    Major depressive disorder    Osteoarthritis of knee 07/20/2021   Subjective tinnitus of  both ears 03/26/2019   Tick bite 08/2015   UTI (urinary tract infection) 10/2015   Assessment: 72 YO female presenting for a colonoscopy procedure 11/20 with SpO2 70-80% and slight shortness of breath. She was transferred to the [facility] ED where CTA chest revealed scattered subsegmental pulmonary emboli throughout the right lung, no RHS. She was intubated in the ED due to lack of improvement on BiPAP. No anticoagulation noted PTA. Pharmacy consulted for heparin dosing.   Significant events: -11/22 at 5pm: lumbar puncture by IR   Today, 03/16/2023: -Heparin level 0.47 - remains therapeutic on heparin infusion at 1050 units/hr -Hgb low but stable; plt stable  -No complications of therapy noted  Goal of Therapy:  Heparin level 0.3-0.7 units/ml Monitor platelets by anticoagulation protocol: Yes   Plan:  -Continue heparin infusion at 1050 units/hr -Daily CBC & heparin level -Monitor for s/sx of bleeding   Arley Phenix RPh 03/16/2023, 5:23 AM

## 2023-03-16 NOTE — Progress Notes (Signed)
NAME:  Karen [name], MRN:  [id number], DOB:  03/13/1951, LOS: 8 ADMISSION DATE:  03/08/2023, CONSULTATION DATE:  03/08/23  REFERRING MD: MD Halivand CHIEF COMPLAINT:  AMS, hypoxia   History of Present Illness:  Pt is a 72 yr old female with a significant pmh of HTN, Hypothyroidism, Major Depressive Disorder, cognitive impairment, arthritis, hearing loss, 30-40lb unintentional weight loss since 05/2022. Per patient's husband and daughter endorse that patient has had a functional and cognitive decline since the beginning of the 2024. Patient has had an extensive workup in regards to cognitive impairment and unintentional weight loss with MRI Brain, MRCPs, CT of chest and abdomen with no definitive etiology.   Patient presented to [facility] ED with AMS and hypoxia (O2 Sats in 70s-80s) by EMS from [facility], who was scheduled for a colonoscopy on 11/20. Patient did not receive colonoscopy due to hypoxia and AMS. Initial VBG revealed pH 7.17 and pCO2 > 123. Patient was given narcan during the initial ED workup with no response and subsequently placed on BIPAP due to increasing hypoxia and respiratory distress. Repeat VBG showed no improvement and patient was intubated due to hypoxia/hypercarbia. Also, CT of chest obtained by EDP showed evidence of scattered subsegmental PE and PCCM was consulted for further vent management as well as management for PE.   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   Significant Hospital Events: Including procedures, antibiotic start and stop dates in addition to other pertinent events   11/20 Admit with AMS, acute hypoxic/hypercarbic respiratory failure, subsegmental PE intubated PCCM consult 11/21 Intubated, sedated, tremors  11/22 Febrile, tachycardic, shivering/tremors seen with physical touch. IR LP obtained 11/23 Neuro consulted. MRI brain ordered 11/28 weaning but high respiratory rate, pressure support of 10  Interim History / Subjective:   Appears to be weaning, mental status is better Becoming tachypneic in the 30s  Objective   Blood pressure 135/77, pulse (!) 114, temperature 99.8 F (37.7 C), temperature source Axillary, resp. rate (!) 36, height 5\' 4"  (1.626 m), weight 60.3 kg, SpO2 100%.    Vent Mode: PRVC FiO2 (%):  [40 %-60 %] 40 % Set Rate:  [14 bmp] 14 bmp Vt Set:  [330 mL] 330 mL PEEP:  [5 cmH20] 5 cmH20 Pressure Support:  [12 cmH20] 12 cmH20 Plateau Pressure:  [17 cmH20-20 cmH20] 17 cmH20   Intake/Output Summary (Last 24 hours) at 03/16/2023 1047 Last data filed at 03/16/2023 6644 Gross per 24 hour  Intake 1810.39 ml  Output 1950 ml  Net -139.61 ml   Filed Weights   03/14/23 0358 03/15/23 0500 03/16/23 0500  Weight: 60.7 kg 63.9 kg 60.3 kg   Physical Exam: General: Elderly, chronically ill-appearing HEENT: Sore mucosa endotracheal tube in place Neuro: Arousable, follows simple commands  CV: Gated PULM: Decreased breath sounds bilaterally GI: soft, bsx4 active  Extremities: No significant edema Skin: no rashes or lesions    Resolved Hospital Problem list   Hypernatremia  Hypomagnesemia  Hypophosphatemia   Assessment & Plan:  Acute hypoxemic/hypercapnic respiratory failure Subsegmental Pes -Continue  weaning -Continue mechanical ventilation -Target TVol 6-8cc/kgIBW -Target Plateau Pressure < 30cm H20 -Target driving pressure less than 15 cm of water -Target PaO2 55-65: titrate PEEP/FiO2 per protocol -Ventilator associated pneumonia prevention protocol -RASS goal of 0 to -1 On heparin  Completed antibiotics  Encephalopathy secondary to hypercarbia -Appears to be improving -Negative MRI 11/23 -Continue home Aricept  Failure to thrive Recent weight loss Protein calorie malnutrition -Continue tube feeds  History of hypertension Hyperlipidemia -Home antihypertensives on hold  GERD -Continue PPI  Hypothyroidism -Continue Synthroid  History of depression/anxiety -Monitor present  She does appear weak May not be able to protect airway -Updated spouse at bedside -Will give her some more time on the vent as she does require high pressure support with associated tachypnea which likely means she may fail extubation  Goals of Care 11/24. Overall patient has had a decline in recent months and mental status prohibits extubation. She is showing some intermittent following of commands however remains encephalopathic and at risk for re-intubation. We discussed the difficulties in recovering after prolonged mechanical vent support and whether tracheostomy would be considered. Daughters would like to continue discussions and remain hopeful that her mental status will improve.  Best Practice (right click and "Reselect all SmartList Selections" daily)   Diet/type: tubefeeds DVT prophylaxis: systemic heparin Pressure ulcer(s). None present GI prophylaxis: PPI Lines: N/A Foley:  Yes, and it is still needed Code Status:  full code Last date of multidisciplinary goals of care discussion: 11/27 updated daughter at bedside  The patient is critically ill with multiple organ systems failure and requires high complexity decision making for assessment and support, frequent evaluation and  titration of therapies, application of advanced monitoring technologies and extensive interpretation of multiple databases. Critical Care Time devoted to patient care services described in this note independent of APP/resident time (if applicable)  is 30 minutes.   Virl Diamond MD [facility] Personal pager: See Amion If unanswered, please page CCM On-call: #[phone]

## 2023-03-16 NOTE — Progress Notes (Signed)
Pharmacy Antibiotic Note  Karen [name] is a 72 y.o. female admitted on 03/08/2023 with altered mental status and hypoxia. She was placed on BiPAP and later intubated. Imaging revealed scattered subsegmental PE. Initial infectious work broad and unrevealing, antibiotics initially narrowed and then stopped after 5 day course. Tracheal aspirate previously collected and now revealing staph aureus. Pharmacy has been consulted for vancomycin dosing.  Plan: -Vancomycin 1500 mg IV x 1 followed by 1000 mg IV q24h -Follow renal function, cultures/sensitivities and clinical progress for dose adjustments and de-escalation as indicated  Height: 5\' 4"  (162.6 cm) Weight: 60.3 kg (132 lb 15 oz) IBW/kg (Calculated) : 54.7  Temp (24hrs), Avg:100.1 F (37.8 C), Min:98.9 F (37.2 C), Max:101 F (38.3 C)  Recent Labs  Lab 03/12/23 0522 03/13/23 0445 03/14/23 0214 03/15/23 0454 03/16/23 0504  WBC 9.0 7.0 6.0 8.7 8.2  CREATININE 0.39* 0.33* 0.51 0.33* <0.30*    CrCl cannot be calculated (This lab value cannot be used to calculate CrCl because it is not a number: <0.30).    Allergies  Allergen Reactions   Zoloft [Sertraline Hcl] Anxiety and Other (See Comments)    "crazy feeling"    Antimicrobials this admission: Vancomycin 11/22 >> 11/25, 11/28 >> Ceftriaxone 11/22 >> 11/26 Ampicillin 11/22 >> 11/23 Acyclovir 11/22 >> 11/23  Dose adjustments this admission: NA  Microbiology results: 11/25 Trach aspirate: staph aureus, pending sensi; candida albicans 11/20 BCx: ngF 11/21 MRSA PCR: negative  Thank you for allowing pharmacy to be a part of this patient's care.  Pricilla Riffle, PharmD, BCPS Clinical Pharmacist 03/16/2023 1:46 PM

## 2023-03-16 NOTE — Progress Notes (Signed)
Arterial line has good waveform, draws and flushes blood at this time, insertion site WNL at this time.

## 2023-03-16 NOTE — Plan of Care (Signed)
  Problem: Nutrition: Goal: Adequate nutrition will be maintained Outcome: Progressing   Problem: Safety: Goal: Ability to remain free from injury will improve Outcome: Progressing   Problem: Skin Integrity: Goal: Risk for impaired skin integrity will decrease Outcome: Progressing   

## 2023-03-17 DIAGNOSIS — E039 Hypothyroidism, unspecified: Secondary | ICD-10-CM | POA: Diagnosis not present

## 2023-03-17 DIAGNOSIS — G9341 Metabolic encephalopathy: Secondary | ICD-10-CM

## 2023-03-17 DIAGNOSIS — J9602 Acute respiratory failure with hypercapnia: Secondary | ICD-10-CM | POA: Diagnosis not present

## 2023-03-17 DIAGNOSIS — J9601 Acute respiratory failure with hypoxia: Secondary | ICD-10-CM | POA: Diagnosis not present

## 2023-03-17 DIAGNOSIS — E440 Moderate protein-calorie malnutrition: Secondary | ICD-10-CM | POA: Diagnosis not present

## 2023-03-17 LAB — PATHOLOGIST SMEAR REVIEW

## 2023-03-17 LAB — CBC
HCT: 27.7 % — ABNORMAL LOW (ref 36.0–46.0)
Hemoglobin: 8.3 g/dL — ABNORMAL LOW (ref 12.0–15.0)
MCH: 29.5 pg (ref 26.0–34.0)
MCHC: 30 g/dL (ref 30.0–36.0)
MCV: 98.6 fL (ref 80.0–100.0)
Platelets: 433 10*3/uL — ABNORMAL HIGH (ref 150–400)
RBC: 2.81 MIL/uL — ABNORMAL LOW (ref 3.87–5.11)
RDW: 13.8 % (ref 11.5–15.5)
WBC: 10.6 10*3/uL — ABNORMAL HIGH (ref 4.0–10.5)
nRBC: 0 % (ref 0.0–0.2)

## 2023-03-17 LAB — GLUCOSE, CAPILLARY
Glucose-Capillary: 109 mg/dL — ABNORMAL HIGH (ref 70–99)
Glucose-Capillary: 120 mg/dL — ABNORMAL HIGH (ref 70–99)
Glucose-Capillary: 94 mg/dL (ref 70–99)
Glucose-Capillary: 132 mg/dL — ABNORMAL HIGH (ref 70–99)
Glucose-Capillary: 102 mg/dL — ABNORMAL HIGH (ref 70–99)

## 2023-03-17 LAB — HEPARIN LEVEL (UNFRACTIONATED): Heparin Unfractionated: 0.41 [IU]/mL (ref 0.30–0.70)

## 2023-03-17 LAB — MAGNESIUM: Magnesium: 1.6 mg/dL — ABNORMAL LOW (ref 1.7–2.4)

## 2023-03-17 MED ORDER — OXYCODONE HCL 5 MG PO TABS
5.0000 mg | ORAL_TABLET | Freq: Four times a day (QID) | ORAL | Status: DC
  Administered 2023-03-17 – 2023-03-18 (×4): 5 mg
  Filled 2023-03-17 (×4): qty 1

## 2023-03-17 MED ORDER — MAGNESIUM SULFATE 4 GM/100ML IV SOLN
4.0000 g | Freq: Once | INTRAVENOUS | Status: AC
  Administered 2023-03-17: 4 g via INTRAVENOUS
  Filled 2023-03-17: qty 100

## 2023-03-17 MED ORDER — OXYCODONE HCL 5 MG PO TABS: 5.0000 mg | ORAL_TABLET | Freq: Four times a day (QID) | ORAL | Status: DC

## 2023-03-17 MED ORDER — MELATONIN 3 MG PO TABS
3.0000 mg | ORAL_TABLET | Freq: Every day | ORAL | Status: DC
  Administered 2023-03-17 – 2023-03-20 (×2): 3 mg
  Filled 2023-03-17 (×2): qty 1

## 2023-03-17 MED ORDER — MELATONIN 3 MG PO TABS
3.0000 mg | ORAL_TABLET | Freq: Every day | ORAL | Status: DC
  Filled 2023-03-17: qty 1

## 2023-03-17 NOTE — Progress Notes (Signed)
PHARMACY - ANTICOAGULATION CONSULT NOTE  Pharmacy Consult for heparin Indication: acute pulmonary embolus  Allergies  Allergen Reactions   Zoloft [Sertraline Hcl] Anxiety and Other (See Comments)    "crazy feeling"    Patient Measurements: Height: 5\' 4"  (162.6 cm) Weight: 60.3 kg (132 lb 15 oz) IBW/kg (Calculated) : 54.7 Heparin Dosing Weight: TBW  Vital Signs: Temp: 100.2 F (37.9 C) (11/29 0411) Temp Source: Core (11/29 0411) BP: 100/54 (11/29 0000) Pulse Rate: 139 (11/29 0426)  Labs: Recent Labs    03/15/23 0454 03/16/23 0504 03/17/23 0358  HGB 8.0* 8.3* 8.3*  HCT 27.4* 27.4* 27.7*  PLT 257 292 433*  HEPARINUNFRC 0.42 0.47 0.41  CREATININE 0.33* <0.30*  --     CrCl cannot be calculated (This lab value cannot be used to calculate CrCl because it is not a number: <0.30).   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 Three variants of uncertain significance were detected - one in the AXIN2 gene called c.1829G>A, one in the PMS2 gene called c.964G>A, and one in the RNF43 gene called c.1114C>T. The report date is 06/24/2019.     The Multi-Cancer Panel offered by [facility] includes sequencing and/or deletion duplication test   GERD (gastroesophageal reflux disease)    History of colonic polyps    Hyperlipidemia    Hypertension    Hypothyroidism    Major depressive disorder    Osteoarthritis of knee 07/20/2021   Subjective tinnitus of  both ears 03/26/2019   Tick bite 08/2015   UTI (urinary tract infection) 10/2015   Assessment: 72 YO female presenting for a colonoscopy procedure 11/20 with SpO2 70-80% and slight shortness of breath. She was transferred to the [facility] ED where CTA chest revealed scattered subsegmental pulmonary emboli throughout the right lung, no RHS. She was intubated in the ED due to lack of improvement on BiPAP. No anticoagulation noted PTA. Pharmacy consulted for heparin dosing.   Significant events: -11/22 at 5pm: lumbar puncture by IR   Today, 03/17/2023: -Heparin level 0.41 - remains therapeutic on heparin infusion at 1050 units/hr -Hgb low but stable; plt stable  -No complications of therapy noted  Goal of Therapy:  Heparin level 0.3-0.7 units/ml Monitor platelets by anticoagulation protocol: Yes   Plan:  -Continue heparin infusion at 1050 units/hr -Daily CBC & heparin level -Monitor for s/sx of bleeding   Arley Phenix RPh 03/17/2023, 4:45 AM

## 2023-03-17 NOTE — Progress Notes (Signed)
[facility] ADULT ICU REPLACEMENT PROTOCOL   The patient does apply for the [facility] Adult ICU Electrolyte Replacment Protocol based on the criteria listed below:   1.Exclusion criteria: TCTS, ECMO, Dialysis, and Myasthenia Gravis patients 2. Is GFR >/= 30 ml/min? Yes.    Patient's GFR today is >60  3. Is SCr </= 2? Yes.   Patient's SCr is <03.30 mg/dL 4. Did SCr increase >/= 0.5 in 24 hours? No. 5.Pt's weight >40kg  Yes.   6. Abnormal electrolyte(s): Mg = 1.6  7. Electrolytes replaced per protocol 8.  Call MD STAT for K+ </= 2.5, Phos </= 1, or Mag </= 1 Physician:  Warrick Parisian, eMD  Suzan Slick Pooja Camuso 03/17/2023 6:06 AM

## 2023-03-17 NOTE — Progress Notes (Addendum)
NAME:  Karen [name], MRN:  [id number], DOB:  14-May-1950, LOS: 9 ADMISSION DATE:  03/08/2023, CONSULTATION DATE:  03/08/23  REFERRING MD: MD Halivand CHIEF COMPLAINT:  AMS, hypoxia   History of Present Illness:  Pt is a 71 yr old female with a significant pmh of HTN, Hypothyroidism, Major Depressive Disorder, cognitive impairment, arthritis, hearing loss, 30-40lb unintentional weight loss since 05/2022. Per patient's husband and daughter endorse that patient has had a functional and cognitive decline since the beginning of the 2024. Patient has had an extensive workup in regards to cognitive impairment and unintentional weight loss with MRI Brain, MRCPs, CT of chest and abdomen with no definitive etiology.   Patient presented to [facility] ED with AMS and hypoxia (O2 Sats in 70s-80s) by EMS from [facility], who was scheduled for a colonoscopy on 11/20. Patient did not receive colonoscopy due to hypoxia and AMS. Initial VBG revealed pH 7.17 and pCO2 > 123. Patient was given narcan during the initial ED workup with no response and subsequently placed on BIPAP due to increasing hypoxia and respiratory distress. Repeat VBG showed no improvement and patient was intubated due to hypoxia/hypercarbia. Also, CT of chest obtained by EDP showed evidence of scattered subsegmental PE and PCCM was consulted for further vent management as well as management for PE.   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   Significant Hospital Events: Including procedures, antibiotic start and stop dates in addition to other pertinent events   11/20 Admit with AMS, acute hypoxic/hypercarbic respiratory failure, subsegmental PE intubated PCCM consult 11/21 Intubated, sedated, tremors  11/22 Febrile, tachycardic, shivering/tremors seen with physical touch. IR LP obtained 11/23 Neuro consulted. MRI brain ordered 11/28 weaning but high respiratory rate, pressure support of 10  Interim History / Subjective:   Vent weaning Lethargic today compared to previous days per family/staff Was reportedly awake most of the night, now tired this morning Weaning 10/5 with low volumes and RR 26  Objective   Blood pressure (!) 103/50, pulse 94, temperature 100.2 F (37.9 C), temperature source Core, resp. rate (!) 27, height 5\' 4"  (1.626 m), weight 62.7 kg, SpO2 99%.    Vent Mode: CPAP;PSV FiO2 (%):  [30 %-50 %] 45 % Set Rate:  [14 bmp] 14 bmp Vt Set:  [330 mL] 330 mL PEEP:  [5 cmH20] 5 cmH20 Pressure Support:  [10 cmH20] 10 cmH20 Plateau Pressure:  [15 cmH20-18 cmH20] 17 cmH20   Intake/Output Summary (Last 24 hours) at 03/17/2023 0836 Last data filed at 03/17/2023 0748 Gross per 24 hour  Intake 2208.66 ml  Output 1684 ml  Net 524.66 ml   Filed Weights   03/15/23 0500 03/16/23 0500 03/17/23 0500  Weight: 63.9 kg 60.3 kg 62.7 kg   Physical Exam: General: Elderly female in NAD on vent HEENT: [place]/AT, PERRL, no JVD Neuro: Lethargic, but will arouse to verbal and follow commands intermittently. RASS -2.  CV: RRR, no MRG PULM: Clear bilateral breath sounds. Diminished bases.  GI: Soft, NT, ND  Extremities: No significant edema, no deformity Skin: no rashes or lesions    Resolved Hospital Problem list   Hypernatremia   Hypophosphatemia   Assessment & Plan:   Acute hypoxemic/hypercapnic respiratory failure Subsegmental Pes - SBT as tolerated - Minimize sedation, she is getting multiple boluses overnight. ( fentanyl and 6mg  versed given over night shift) - Ventilator associated pneumonia prevention protocol - RASS goal of 0 - Heparin infusion continue  Encephalopathy secondary to hypercarbia: Negative MRI 11/23 -Improving overall -Will schedule oxycodone to limit fentanyl bolus use -Continue home Aricept -Need to re-establish sleep wake cycles -Melatonin QHS  Failure to thrive Recent weight loss Protein calorie malnutrition - Tube feed per RD recs  History of hypertension Hyperlipidemia -Home antihypertensives on hold  GERD -Continue PPI  Hypothyroidism -Continue Synthroid  History of depression/anxiety -Monitor present  She does appear weak May not be able to protect airway - Updated son at bedside - SBT not quite robust enough for extubation trial. Hopefully with more time and improved sedation regimen/sleep wake cycle we can offer a trial in the next day or two.  - Passive ROM ordered   Goals of Care 11/24. Overall patient has had a decline in recent months and mental status prohibits extubation. She is showing some intermittent following of commands however remains encephalopathic and at risk for re-intubation. We discussed the difficulties in recovering after prolonged mechanical vent support and whether tracheostomy would be considered. Daughters would like to continue discussions and remain hopeful that her mental status will improve.  Best Practice (right click and "Reselect all SmartList Selections" daily)   Diet/type: tubefeeds DVT prophylaxis: systemic heparin Pressure ulcer(s). None present GI prophylaxis: PPI Lines: N/A Foley:  Yes, and it is still needed Code Status:  full code Last date of multidisciplinary goals of care discussion: 11/29 updated son at  bedside  Critical care time 33 minutes   Joneen Roach, AGACNP-BC [facility]  See Amion for personal pager PCCM on call pager [phone] until 7pm. Please call Elink 7p-7a. [phone]  03/17/2023 8:45 AM

## 2023-03-17 NOTE — Plan of Care (Signed)
  Problem: Clinical Measurements: Goal: Ability to maintain clinical measurements within normal limits will improve Outcome: Progressing Goal: Will remain free from infection Outcome: Progressing Goal: Diagnostic test results will improve Outcome: Progressing Goal: Respiratory complications will improve Outcome: Progressing Goal: Cardiovascular complication will be avoided Outcome: Progressing   Problem: Pain Management: Goal: General experience of comfort will improve Outcome: Progressing   Problem: Safety: Goal: Ability to remain free from injury will improve Outcome: Progressing

## 2023-03-17 NOTE — Plan of Care (Signed)
  Problem: Safety: Goal: Ability to remain free from injury will improve Outcome: Progressing   Problem: Skin Integrity: Goal: Risk for impaired skin integrity will decrease Outcome: Progressing   

## 2023-03-17 NOTE — Progress Notes (Signed)
RT note: Pt. seen on midnight rounds, notified of Oxygen saturations dropping, pulse oximeter probe/site changed, FI02 >'d to 100%, weaned to 60%, currently has Arterial Line placed, E-LINK notified for ABG order for current Oxygen/Ventilation Status, has been given >'d sedation prior with only marginal effects with saturations.

## 2023-03-18 ENCOUNTER — Inpatient Hospital Stay (HOSPITAL_COMMUNITY): Payer: Medicare HMO

## 2023-03-18 ENCOUNTER — Encounter (HOSPITAL_COMMUNITY): Payer: Self-pay | Admitting: Pulmonary Disease

## 2023-03-18 ENCOUNTER — Other Ambulatory Visit (HOSPITAL_COMMUNITY): Payer: Medicare HMO

## 2023-03-18 DIAGNOSIS — E440 Moderate protein-calorie malnutrition: Secondary | ICD-10-CM | POA: Diagnosis not present

## 2023-03-18 DIAGNOSIS — E039 Hypothyroidism, unspecified: Secondary | ICD-10-CM | POA: Diagnosis not present

## 2023-03-18 DIAGNOSIS — J9602 Acute respiratory failure with hypercapnia: Secondary | ICD-10-CM | POA: Diagnosis not present

## 2023-03-18 DIAGNOSIS — J9601 Acute respiratory failure with hypoxia: Secondary | ICD-10-CM | POA: Diagnosis not present

## 2023-03-18 LAB — GLUCOSE, CAPILLARY
Glucose-Capillary: 107 mg/dL — ABNORMAL HIGH (ref 70–99)
Glucose-Capillary: 118 mg/dL — ABNORMAL HIGH (ref 70–99)
Glucose-Capillary: 129 mg/dL — ABNORMAL HIGH (ref 70–99)
Glucose-Capillary: 138 mg/dL — ABNORMAL HIGH (ref 70–99)
Glucose-Capillary: 142 mg/dL — ABNORMAL HIGH (ref 70–99)
Glucose-Capillary: 147 mg/dL — ABNORMAL HIGH (ref 70–99)

## 2023-03-18 LAB — CBC
HCT: 23.5 % — ABNORMAL LOW (ref 36.0–46.0)
Hemoglobin: 7 g/dL — ABNORMAL LOW (ref 12.0–15.0)
MCH: 29.2 pg (ref 26.0–34.0)
MCHC: 29.8 g/dL — ABNORMAL LOW (ref 30.0–36.0)
MCV: 97.9 fL (ref 80.0–100.0)
Platelets: 440 10*3/uL — ABNORMAL HIGH (ref 150–400)
RBC: 2.4 MIL/uL — ABNORMAL LOW (ref 3.87–5.11)
RDW: 13.6 % (ref 11.5–15.5)
WBC: 9.7 10*3/uL (ref 4.0–10.5)
nRBC: 0 % (ref 0.0–0.2)

## 2023-03-18 LAB — MAGNESIUM: Magnesium: 1.9 mg/dL (ref 1.7–2.4)

## 2023-03-18 LAB — BLOOD GAS, ARTERIAL
Acid-Base Excess: 10.1 mmol/L — ABNORMAL HIGH (ref 0.0–2.0)
Bicarbonate: 37.5 mmol/L — ABNORMAL HIGH (ref 20.0–28.0)
Drawn by: 225631
FIO2: 60 %
MECHVT: 330 mL
O2 Saturation: 99.6 %
PEEP: 5 cmH2O
Patient temperature: 37.2
RATE: 32 {breaths}/min
pCO2 arterial: 62 mm[Hg] — ABNORMAL HIGH (ref 32–48)
pH, Arterial: 7.39 (ref 7.35–7.45)
pO2, Arterial: 101 mm[Hg] (ref 83–108)

## 2023-03-18 LAB — PHOSPHORUS: Phosphorus: 3.2 mg/dL (ref 2.5–4.6)

## 2023-03-18 LAB — HEPARIN LEVEL (UNFRACTIONATED)
Heparin Unfractionated: 0.27 [IU]/mL — ABNORMAL LOW (ref 0.30–0.70)
Heparin Unfractionated: 0.49 [IU]/mL (ref 0.30–0.70)

## 2023-03-18 LAB — BASIC METABOLIC PANEL
Anion gap: 6 (ref 5–15)
BUN: 15 mg/dL (ref 8–23)
CO2: 34 mmol/L — ABNORMAL HIGH (ref 22–32)
Calcium: 9.4 mg/dL (ref 8.9–10.3)
Chloride: 99 mmol/L (ref 98–111)
Creatinine, Ser: 0.34 mg/dL — ABNORMAL LOW (ref 0.44–1.00)
GFR, Estimated: >60 mL/min (ref >60)
Glucose, Bld: 140 mg/dL — ABNORMAL HIGH (ref 70–99)
Potassium: 4 mmol/L (ref 3.5–5.1)
Sodium: 139 mmol/L (ref 135–145)

## 2023-03-18 LAB — VITAMIN B1: Vitamin B1 (Thiamine): 252.2 nmol/L — ABNORMAL HIGH (ref 66.5–200.0)

## 2023-03-18 LAB — PREPARE RBC (CROSSMATCH)

## 2023-03-18 MED ORDER — OXYCODONE HCL 5 MG PO TABS: 5.0000 mg | ORAL_TABLET | Freq: Four times a day (QID) | ORAL | Status: DC

## 2023-03-18 MED ORDER — ACETAMINOPHEN 325 MG PO TABS: 650.0000 mg | ORAL_TABLET | Freq: Four times a day (QID) | ORAL | Status: DC | PRN

## 2023-03-18 MED ORDER — LORAZEPAM 2 MG/ML IJ SOLN
0.5000 mg | Freq: Once | INTRAMUSCULAR | Status: AC
  Administered 2023-03-18: 0.5 mg via INTRAVENOUS

## 2023-03-18 MED ORDER — SODIUM CHLORIDE 0.9% IV SOLUTION: Freq: Once | INTRAVENOUS | Status: AC

## 2023-03-18 MED ORDER — ROSUVASTATIN CALCIUM 10 MG PO TABS: 10.0000 mg | ORAL_TABLET | Freq: Every day | ORAL | Status: DC

## 2023-03-18 MED ORDER — SCOPOLAMINE 1 MG/3DAYS TD PT72
1.0000 | MEDICATED_PATCH | TRANSDERMAL | Status: DC
  Administered 2023-03-18: 1.5 mg via TRANSDERMAL
  Filled 2023-03-18: qty 1

## 2023-03-18 MED ORDER — PANTOPRAZOLE SODIUM 40 MG IV SOLR
40.0000 mg | Freq: Every day | INTRAVENOUS | Status: DC
  Administered 2023-03-19 – 2023-04-04 (×18): 40 mg via INTRAVENOUS
  Filled 2023-03-18 (×18): qty 10

## 2023-03-18 MED ORDER — PANTOPRAZOLE SODIUM 40 MG PO TBEC: 40.0000 mg | DELAYED_RELEASE_TABLET | Freq: Every day | ORAL | Status: DC

## 2023-03-18 MED ORDER — LORAZEPAM BOLUS VIA INFUSION: 0.5000 mg | Freq: Once | INTRAVENOUS | Status: DC

## 2023-03-18 MED ORDER — POLYETHYLENE GLYCOL 3350 17 G PO PACK: 17.0000 g | PACK | Freq: Every day | ORAL | Status: DC | PRN

## 2023-03-18 MED ORDER — LORAZEPAM 2 MG/ML IJ SOLN
0.5000 mg | Freq: Four times a day (QID) | INTRAMUSCULAR | Status: DC | PRN
  Administered 2023-03-18 – 2023-03-19 (×3): 0.5 mg via INTRAVENOUS
  Filled 2023-03-18 (×3): qty 1

## 2023-03-18 MED ORDER — MAGNESIUM SULFATE 2 GM/50ML IV SOLN
2.0000 g | Freq: Once | INTRAVENOUS | Status: AC
  Administered 2023-03-18: 2 g via INTRAVENOUS
  Filled 2023-03-18: qty 50

## 2023-03-18 MED ORDER — POLYETHYLENE GLYCOL 3350 17 G PO PACK: 17.0000 g | PACK | Freq: Every day | ORAL | Status: DC

## 2023-03-18 MED ORDER — LEVOTHYROXINE SODIUM 100 MCG/5ML IV SOLN
75.0000 ug | Freq: Every day | INTRAVENOUS | Status: AC
  Administered 2023-03-19: 75 ug via INTRAVENOUS
  Filled 2023-03-18: qty 5

## 2023-03-18 MED ORDER — HYDROXYZINE HCL 25 MG PO TABS: 12.5000 mg | ORAL_TABLET | Freq: Every day | ORAL | Status: DC

## 2023-03-18 MED ORDER — LEVOTHYROXINE SODIUM 100 MCG PO TABS: 100.0000 ug | ORAL_TABLET | Freq: Every day | ORAL | Status: DC

## 2023-03-18 MED ORDER — LORAZEPAM 2 MG/ML IJ SOLN
INTRAMUSCULAR | Status: AC
  Administered 2023-03-18: 0.5 mg via INTRAVENOUS
  Filled 2023-03-18: qty 1

## 2023-03-18 MED ORDER — LORAZEPAM 2 MG/ML IJ SOLN: 0.5000 mg | Freq: Once | INTRAMUSCULAR | Status: AC

## 2023-03-18 MED ORDER — MIRTAZAPINE 15 MG PO TABS: 30.0000 mg | ORAL_TABLET | Freq: Every day | ORAL | Status: DC

## 2023-03-18 MED ORDER — ORAL CARE MOUTH RINSE
15.0000 mL | OROMUCOSAL | Status: DC
  Administered 2023-03-18 – 2023-03-20 (×7): 15 mL via OROMUCOSAL

## 2023-03-18 MED ORDER — SODIUM CHLORIDE 0.9% FLUSH
3.0000 mL | INTRAVENOUS | Status: DC | PRN
  Administered 2023-03-18 – 2023-03-30 (×8): 3 mL via INTRAVENOUS

## 2023-03-18 MED ORDER — DONEPEZIL HCL 10 MG PO TABS: 10.0000 mg | ORAL_TABLET | Freq: Every day | ORAL | Status: DC

## 2023-03-18 MED ORDER — LORAZEPAM 2 MG/ML IJ SOLN
INTRAMUSCULAR | Status: AC
  Filled 2023-03-18: qty 1

## 2023-03-18 MED ORDER — VITAMIN B-6 100 MG PO TABS
100.0000 mg | ORAL_TABLET | Freq: Every day | ORAL | Status: DC
  Filled 2023-03-18 (×2): qty 1

## 2023-03-18 NOTE — Progress Notes (Signed)
   03/18/23 0945  Vent Select  Vent end date 03/18/23 (Extubated to 2 L [place], 97%.)  Vent end time 0945  Adult Ventilator Measurements  SpO2 96 %  Suction Method  Respiratory Interventions Airway suction;Oral suction  Oral Suctioning/Secretions  Suction Type Oral  Suction Device Yankauer  Secretion Amount Moderate  Secretion Color Clear  Secretion Consistency Thin  Suction Tolerance Tolerated well  Suctioning Adverse Effects None  Airway Suctioning/Secretions  Suction Type ETT  Secretion Amount Small  Secretion Color Yellow  Secretion Consistency Thin  Suction Tolerance Tolerated well  Suctioning Adverse Effects None   Extubated per MD order.

## 2023-03-18 NOTE — Progress Notes (Signed)
[facility] ADULT ICU REPLACEMENT PROTOCOL   The patient does apply for the [facility] Adult ICU Electrolyte Replacment Protocol based on the criteria listed below:   1.Exclusion criteria: TCTS, ECMO, Dialysis, and Myasthenia Gravis patients 2. Is GFR >/= 30 ml/min? Yes.    Patient's GFR today is >60 3. Is SCr </= 2? Yes.   Patient's SCr is 0.34 mg/dL 4. Did SCr increase >/= 0.5 in 24 hours? No. 5.Pt's weight >40kg  Yes.   6. Abnormal electrolyte(s): Mg = 1.9  7. Electrolytes replaced per protocol 8.  Call MD STAT for K+ </= 2.5, Phos </= 1, or Mag </= 1 Physician:  Warrick Parisian, eMD  Suzan Slick Shalona Harbour 03/18/2023 6:05 AM

## 2023-03-18 NOTE — Progress Notes (Signed)
PHARMACY - ANTICOAGULATION CONSULT NOTE  Pharmacy Consult for heparin Indication: acute pulmonary embolus  Allergies  Allergen Reactions   Zoloft [Sertraline Hcl] Anxiety and Other (See Comments)    "crazy feeling"    Patient Measurements: Height: 5\' 4"  (162.6 cm) Weight: 64.1 kg (141 lb 5 oz) IBW/kg (Calculated) : 54.7 Heparin Dosing Weight: TBW  Vital Signs: Temp: 98.1 F (36.7 C) (11/30 0040) Temp Source: Oral (11/30 0040) BP: 103/40 (11/30 0530) Pulse Rate: 85 (11/30 0535)  Labs: Recent Labs    03/16/23 0504 03/17/23 0358 03/18/23 0455  HGB 8.3* 8.3* 7.0*  HCT 27.4* 27.7* 23.5*  PLT 292 433* 440*  HEPARINUNFRC 0.47 0.41 0.27*  CREATININE <0.30*  --  0.34*    Estimated Creatinine Clearance: 56.5 mL/min (A) (by C-G formula based on SCr of 0.34 mg/dL (L)).   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 Three variants of uncertain significance were detected - one in the AXIN2 gene called c.1829G>A, one in the PMS2 gene called c.964G>A, and one in the RNF43 gene called c.1114C>T. The report date is 06/24/2019.     The Multi-Cancer Panel offered by [facility] includes sequencing and/or deletion duplication test   GERD (gastroesophageal reflux disease)    History of colonic polyps    Hyperlipidemia    Hypertension    Hypothyroidism    Major depressive disorder    Osteoarthritis of knee 07/20/2021   Subjective tinnitus of both ears  03/26/2019   Tick bite 08/2015   UTI (urinary tract infection) 10/2015   Assessment: 72 YO female presenting for a colonoscopy procedure 11/20 with SpO2 70-80% and slight shortness of breath. She was transferred to the [facility] ED where CTA chest revealed scattered subsegmental pulmonary emboli throughout the right lung, no RHS. She was intubated in the ED due to lack of improvement on BiPAP. No anticoagulation noted PTA. Pharmacy consulted for heparin dosing.   Significant events: -11/22 at 5pm: lumbar puncture by IR   Today, 03/18/2023: -Heparin level 0.27 - sub therapeutic on heparin infusion at 1050 units/hr -Hgb low but stable; plt stable  -No complications of therapy noted  Goal of Therapy:  Heparin level 0.3-0.7 units/ml Monitor platelets by anticoagulation protocol: Yes   Plan:  -increase heparin to 1200 units/hr - heparin level in 8 hours -Daily CBC & heparin level -Monitor for s/sx of bleeding   Arley Phenix RPh 03/18/2023, 6:01 AM

## 2023-03-18 NOTE — Progress Notes (Signed)
   03/18/23 1125  BiPAP/CPAP/SIPAP  FiO2 (%) (S)  60 % (Pt's saturation decreased low 80%, repositioned pt, suctioned mouth, increased to 60%, saturation iimproved to 95%. Will wean FI02 as tolerated.)  BiPAP/CPAP /SiPAP Vitals  Pulse Rate (!) 117  Resp (!) 28  SpO2 96 %  MEWS Score/Color  MEWS Score 4  MEWS Score Color Red

## 2023-03-18 NOTE — Progress Notes (Signed)
   03/18/23 0820  Vent Select  Invasive or Noninvasive Invasive  Adult Vent Y  Airway 7.5 mm  Placement Date/Time: 03/08/23 1923   Placed By: ED Physician  Airway Device: Endotracheal Tube  ETT Types: Oral  Size (mm): 7.5 mm  Cuffed: Cuffed  Insertion attempts: 1  Airway Equipment: Stylet;Video Laryngoscope  Placement Confirmation: Direct Visu...  Secured at (cm) 21 cm  Measured From [employer]  Secured By Commercial Tube Holder  Tube Holder Repositioned Yes  Prone position No  Head position Right  Cuff Pressure (cm H2O) Green OR 18-26 CmH2O  Site Condition Drainage (Comment) (suctioned, clear to white, thin amts of secretions.)  Adult Ventilator Settings  Vent Type Servo i  Humidity HME  Vent Mode (S)  PSV;CPAP  FiO2 (%) (S)  40 %  Pressure Support (S)  10 cmH20  PEEP (S)  5 cmH20  Adult Ventilator Measurements  Peak Airway Pressure 16 L/min  Mean Airway Pressure 9 cmH20  Resp Rate Spontaneous 29 br/min  Resp Rate Total 29 br/min  Spont TV 389 mL  Measured Ve 11 L  Total PEEP 5 cmH20  SpO2 97 %  Adult Ventilator Alarms  Alarms On Y  Ve High Alarm 18 L/min  Ve Low Alarm 3 L/min  Resp Rate High Alarm 35 br/min  Resp Rate Low Alarm 8  PEEP Low Alarm 3 cmH2O  Press High Alarm 40 cmH2O  T Apnea 20 sec(s)  VAP Prevention  HME changed Yes  HOB> 30 Degrees Y  Breath Sounds  Bilateral Breath Sounds Diminished  R Upper  Breath Sounds Diminished  L Upper Breath Sounds Diminished  R Lower Breath Sounds Diminished  L Lower Breath Sounds Diminished  Vent Respiratory Assessment  Level of Consciousness Alert  Suction Method  Respiratory Interventions Airway suction;Oral suction  Oral Suctioning/Secretions  Suction Type Oral  Suction Device Yankauer  Secretion Amount Moderate  Secretion Color White;Clear  Secretion Consistency Thin  Suction Tolerance Tolerated well  Suctioning Adverse Effects None  Airway Suctioning/Secretions  Suction Type ETT   Suction Device  Catheter  Secretion Amount Small  Secretion Color Yellow  Secretion Consistency Thick;Thin  Suction Tolerance Tolerated well  Suctioning Adverse Effects None   Initiated PSV 10/5, 40%.

## 2023-03-18 NOTE — Progress Notes (Signed)
On BiPAP Tachypneic  H&H noted  Will transfuse 1 unit for hemoglobin of 7,   Discussed with spouse at bedside  Attempted to reach daughter but went to voicemail

## 2023-03-18 NOTE — Plan of Care (Signed)
  Problem: Education: Goal: Knowledge of General Education information will improve Description: Including pain rating scale, medication(s)/side effects and non-pharmacologic comfort measures Outcome: Progressing   Problem: Health Behavior/Discharge Planning: Goal: Ability to manage health-related needs will improve Outcome: Progressing   Problem: Clinical Measurements: Goal: Ability to maintain clinical measurements within normal limits will improve Outcome: Progressing Goal: Will remain free from infection Outcome: Progressing Goal: Diagnostic test results will improve Outcome: Progressing Goal: Respiratory complications will improve Outcome: Progressing Goal: Cardiovascular complication will be avoided Outcome: Progressing   Problem: Activity: Goal: Risk for activity intolerance will decrease Outcome: Progressing   Problem: Nutrition: Goal: Adequate nutrition will be maintained Outcome: Progressing   Problem: Coping: Goal: Level of anxiety will decrease Outcome: Progressing   Problem: Elimination: Goal: Will not experience complications related to urinary retention Outcome: Progressing   Problem: Pain Management: Goal: General experience of comfort will improve Outcome: Progressing   Problem: Safety: Goal: Ability to remain free from injury will improve Outcome: Progressing   Problem: Skin Integrity: Goal: Risk for impaired skin integrity will decrease Outcome: Progressing   Problem: Safety: Goal: Non-violent Restraint(s) Outcome: Progressing

## 2023-03-18 NOTE — Progress Notes (Addendum)
PHARMACY - ANTICOAGULATION CONSULT NOTE  Pharmacy Consult for heparin Indication: acute pulmonary embolus  Allergies  Allergen Reactions   Zoloft [Sertraline Hcl] Anxiety and Other (See Comments)    "crazy feeling"    Patient Measurements: Height: 5\' 4"  (162.6 cm) Weight: 64.1 kg (141 lb 5 oz) IBW/kg (Calculated) : 54.7 Heparin Dosing Weight: TBW  Vital Signs: Temp: 97.8 F (36.6 C) (11/30 1200) Temp Source: Oral (11/30 1200) BP: 112/68 (11/30 1700) Pulse Rate: 129 (11/30 1700)  Labs: Recent Labs    03/16/23 0504 03/17/23 0358 03/18/23 0455 03/18/23 1614  HGB 8.3* 8.3* 7.0*  --   HCT 27.4* 27.7* 23.5*  --   PLT 292 433* 440*  --   HEPARINUNFRC 0.47 0.41 0.27* 0.49  CREATININE <0.30*  --  0.34*  --     Estimated Creatinine Clearance: 56.5 mL/min (A) (by C-G formula based on SCr of 0.34 mg/dL (L)).   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 Three variants of uncertain significance were detected - one in the AXIN2 gene called c.1829G>A, one in the PMS2 gene called c.964G>A, and one in the RNF43 gene called c.1114C>T. The report date is 06/24/2019.     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 She was transferred to the [facility] ED where CTA chest revealed scattered subsegmental pulmonary emboli throughout the right lung, no RHS. She was intubated in the ED due to lack of improvement on BiPAP. No anticoagulation noted PTA. Pharmacy consulted for heparin dosing.   Significant events: -11/21: LE Doppler: Findings consistent with age indeterminate DVT involving the left peroneal veins.  -11/22: lumbar puncture by IR  -11/30: extubated  Today, 03/18/2023: -1614 heparin level = 0.49 units/mL, now therapeutic on heparin infusion at 1200 units/hr -CBC: Hgb 7, low/decreased; Plt elevated  -Plan to transfuse 1 unit PRBCs -No bleeding or infusion issues noted per nursing   Goal of Therapy:  Heparin level 0.3-0.7 units/ml Monitor platelets by anticoagulation protocol: Yes   Plan:  -Continue heparin infusion at 1200 units/hr -Heparin level in 8 hours to confirm remains in goal range -Daily CBC, heparin level -Monitor closely for s/sx of bleeding   Greer Pickerel, PharmD, BCPS Clinical Pharmacist 03/18/2023, 5:21 PM

## 2023-03-18 NOTE — Progress Notes (Signed)
   03/18/23 1056  BiPAP/CPAP/SIPAP  $ Non-Invasive Ventilator  Non-Invasive Vent Set Up;Non-Invasive Vent Initial  $ Face Mask Medium Yes  BiPAP/CPAP/SIPAP Pt Type Adult  BiPAP/CPAP/SIPAP SERVO  Mask Type Full face mask  Mask Size Medium  Set Rate (S)  15 breaths/min  Respiratory Rate 33 breaths/min  Pressure Support (S)  10 cmH20  PEEP (S)  5 cmH20  FiO2 (%) (S)  40 %  Minute Ventilation 9  Leak 36  Peak Inspiratory Pressure (PIP) 15  Tidal Volume (Vt) 340  Patient Home Equipment No  Auto Titrate No  Press High Alarm 23 cmH2O  Press Low Alarm 5 cmH2O  Nasal massage performed No (comment)  CPAP/SIPAP surface wiped down Yes  Oxygen Percent 40 %  BiPAP/CPAP /SiPAP Vitals  Pulse Rate 97  Resp (!) 30  SpO2 95 %  Bilateral Breath Sounds Diminished  MEWS Score/Color  MEWS Score 2  MEWS Score Color Yellow

## 2023-03-18 NOTE — Plan of Care (Signed)
Patient was extubated this morning. Originally tolerated well but then became agitated and had to be placed on BiPAP. Patient tachycardic and tachypnic while on BiPAP.   Problem: Education: Goal: Knowledge of General Education information will improve Description: Including pain rating scale, medication(s)/side effects and non-pharmacologic comfort measures Outcome: Not Progressing   Problem: Health Behavior/Discharge Planning: Goal: Ability to manage health-related needs will improve Outcome: Not Progressing   Problem: Clinical Measurements: Goal: Ability to maintain clinical measurements within normal limits will improve Outcome: Not Progressing Goal: Diagnostic test results will improve Outcome: Not Progressing Goal: Respiratory complications will improve Outcome: Not Progressing

## 2023-03-18 NOTE — Progress Notes (Signed)
Extubated this morning and was doing well but now agitated  Does suffer from anxiety and takes hydroxyzine daily according to family members  Ativan was given  Placed on BiPAP  Will continue to monitor closely

## 2023-03-18 NOTE — Progress Notes (Signed)
NAME:  Karen [name], MRN:  [id number], DOB:  06/19/1950, LOS: 10 ADMISSION DATE:  03/08/2023, CONSULTATION DATE:  03/08/23  REFERRING MD: MD Halivand CHIEF COMPLAINT:  AMS, hypoxia   History of Present Illness:  Pt is a 72 yr old female with a significant pmh of HTN, Hypothyroidism, Major Depressive Disorder, cognitive impairment, arthritis, hearing loss, 30-40lb unintentional weight loss since 05/2022. Per patient's husband and daughter endorse that patient has had a functional and cognitive decline since the beginning of the 2024. Patient has had an extensive workup in regards to cognitive impairment and unintentional weight loss with MRI Brain, MRCPs, CT of chest and abdomen with no definitive etiology.   Patient presented to [facility] ED with AMS and hypoxia (O2 Sats in 70s-80s) by EMS from [facility], who was scheduled for a colonoscopy on 11/20. Patient did not receive colonoscopy due to hypoxia and AMS. Initial VBG revealed pH 7.17 and pCO2 > 123. Patient was given narcan during the initial ED workup with no response and subsequently placed on BIPAP due to increasing hypoxia and respiratory distress. Repeat VBG showed no improvement and patient was intubated due to hypoxia/hypercarbia. Also, CT of chest obtained by EDP showed evidence of scattered subsegmental PE and PCCM was consulted for further vent management as well as management for PE.   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   Significant Hospital Events: Including procedures, antibiotic start and stop dates in addition to other pertinent events   11/20 Admit with AMS, acute hypoxic/hypercarbic respiratory failure, subsegmental PE intubated PCCM consult 11/21 Intubated, sedated, tremors  11/22 Febrile, tachycardic, shivering/tremors seen with physical touch. IR LP obtained 11/23 Neuro consulted. MRI brain ordered 11/28 weaning but high respiratory rate, pressure support of 10  Interim History / Subjective:   More alert and interactive Tolerating weaning  Objective   Blood pressure 138/71, pulse 95, temperature 99.4 F (37.4 C), temperature source Bladder, resp. rate (!) 28, height 5\' 4"  (1.626 m), weight 64.1 kg, SpO2 97%.    Vent Mode: PSV;CPAP FiO2 (%):  [30 %-60 %] 40 % Set Rate:  [14 bmp] 14 bmp Vt Set:  [330 mL] 330 mL PEEP:  [5 cmH20] 5 cmH20 Pressure Support:  [10 cmH20-14 cmH20] 10 cmH20 Plateau Pressure:  [8 cmH20-18 cmH20] 18 cmH20   Intake/Output Summary (Last 24 hours) at 03/18/2023 0908 Last data filed at 03/18/2023 7846 Gross per 24 hour  Intake 2129.6 ml  Output 2225 ml  Net -95.4 ml   Filed Weights   03/16/23 0500 03/17/23 0500 03/18/23 0500  Weight: 60.3 kg 62.7 kg 64.1 kg   Physical Exam: General: Elderly lady, does not appear to be in distress HEENT: Moist oral mucosa, endotracheal tube in place  Neuro: Easily arousable, follows command CV: RRR, no MRG PULM: Clear breath sounds, diminished at the bases GI: Soft, NT, ND Extremities: No significant edema, no deformity Skin: no rashes or lesions    Resolved Hospital Problem list   Hypernatremia  Hypophosphatemia  Assessment & Plan:   Acute hypoxemic/hypercapnic respiratory failure Subsegmental PEs -Tolerating weaning  For  PEs -Continue heparin  Encephalopathy appears to be clearing Related to hypercarbia Negative MRI -Home Aricept was continued  Failure to thrive Protein calorie malnutrition -Was on tube feeds  History of hypertension Hyperlipidemia -Will reinitiate home antihypertensives when extubated  GERD -Continue PPI  Hypothyroidism Continue Synthroid  History of depression/anxiety -Continue to monitor  Updated family members at bedside  She is weaning better today and we will give her a chance of extubation   Goals of Care 11/24. Overall patient has had a decline in recent months and mental status prohibits extubation. She is showing some intermittent following of commands however remains encephalopathic and at risk for re-intubation. We discussed the difficulties in recovering after prolonged mechanical vent support and whether tracheostomy would be considered. Daughters would like to continue discussions and remain hopeful that her mental status will improve.  Best Practice (right click and "Reselect all SmartList Selections" daily)   Diet/type: tubefeeds DVT prophylaxis: systemic heparin Pressure ulcer(s). None present GI prophylaxis: PPI Lines: N/A Foley:  Yes, and it is still needed Code Status:  full code Last date of multidisciplinary goals of care discussion: 11/29 updated son at bedside  The patient is critically ill with multiple organ systems failure and requires high complexity decision making for assessment and support, frequent evaluation and titration of therapies, application of advanced monitoring technologies and extensive interpretation of multiple databases. Critical Care Time devoted to patient care services described in this note independent of APP/resident time (if applicable)  is 31 minutes.   Virl Diamond MD [facility] Personal pager: See Amion If unanswered, please page CCM On-call: #[phone]

## 2023-03-18 NOTE — Progress Notes (Signed)
eLink Physician-Brief Progress Note Patient Name: Karen [name] DOB: 08-28-1950 MRN: [id number]   Date of Service  03/18/2023  HPI/Events of Note    eICU Interventions     Pt extubated to BIPA earlier and NGT D/C'd as well. Change Synthroid to IV      [facility] 03/18/2023, 8:50 PM

## 2023-03-18 NOTE — Procedures (Signed)
Extubation Procedure Note  Patient Details:   Name: Karen [name] DOB: 1951-04-12 MRN: [id number]   Airway Documentation:    Vent end date: 03/18/23 (Extubated to 2 L [place], 97%.) Vent end time: 0945   Evaluation  O2 sats: stable throughout Complications: No apparent complications Patient did tolerate procedure well. Bilateral Breath Sounds: Diminished   Yes  Nunzio Cobbs 03/18/2023, 10:18 AM

## 2023-03-18 NOTE — Progress Notes (Signed)
RT Note: Arterial Blood drawn from Arterial line per [facility] order obtained for current Oxygen/Ventilation Status.

## 2023-03-19 ENCOUNTER — Inpatient Hospital Stay (HOSPITAL_COMMUNITY): Payer: Medicare HMO

## 2023-03-19 DIAGNOSIS — J9601 Acute respiratory failure with hypoxia: Secondary | ICD-10-CM | POA: Diagnosis not present

## 2023-03-19 DIAGNOSIS — E039 Hypothyroidism, unspecified: Secondary | ICD-10-CM | POA: Diagnosis not present

## 2023-03-19 DIAGNOSIS — E440 Moderate protein-calorie malnutrition: Secondary | ICD-10-CM | POA: Diagnosis not present

## 2023-03-19 DIAGNOSIS — J9602 Acute respiratory failure with hypercapnia: Secondary | ICD-10-CM | POA: Diagnosis not present

## 2023-03-19 LAB — GLUCOSE, CAPILLARY
Glucose-Capillary: 103 mg/dL — ABNORMAL HIGH (ref 70–99)
Glucose-Capillary: 106 mg/dL — ABNORMAL HIGH (ref 70–99)
Glucose-Capillary: 107 mg/dL — ABNORMAL HIGH (ref 70–99)
Glucose-Capillary: 112 mg/dL — ABNORMAL HIGH (ref 70–99)
Glucose-Capillary: 116 mg/dL — ABNORMAL HIGH (ref 70–99)
Glucose-Capillary: 127 mg/dL — ABNORMAL HIGH (ref 70–99)

## 2023-03-19 LAB — BLOOD GAS, ARTERIAL
Acid-Base Excess: 9.1 mmol/L — ABNORMAL HIGH (ref 0.0–2.0)
Bicarbonate: 36.3 mmol/L — ABNORMAL HIGH (ref 20.0–28.0)
Delivery systems: POSITIVE
Drawn by: 23532
Expiratory PAP: 5 cm[H2O]
FIO2: 0.5 %
Inspiratory PAP: 15 cm[H2O]
Mode: POSITIVE
O2 Saturation: 91.2 %
Patient temperature: 37
pCO2 arterial: 60 mm[Hg] — ABNORMAL HIGH (ref 32–48)
pH, Arterial: 7.39 (ref 7.35–7.45)
pO2, Arterial: 51 mm[Hg] — ABNORMAL LOW (ref 83–108)

## 2023-03-19 LAB — CBC
HCT: 31.8 % — ABNORMAL LOW (ref 36.0–46.0)
Hemoglobin: 9.5 g/dL — ABNORMAL LOW (ref 12.0–15.0)
MCH: 29.6 pg (ref 26.0–34.0)
MCHC: 29.9 g/dL — ABNORMAL LOW (ref 30.0–36.0)
MCV: 99.1 fL (ref 80.0–100.0)
Platelets: 495 10*3/uL — ABNORMAL HIGH (ref 150–400)
RBC: 3.21 MIL/uL — ABNORMAL LOW (ref 3.87–5.11)
RDW: 15.4 % (ref 11.5–15.5)
WBC: 10.6 10*3/uL — ABNORMAL HIGH (ref 4.0–10.5)
nRBC: 0 % (ref 0.0–0.2)

## 2023-03-19 LAB — HEPARIN LEVEL (UNFRACTIONATED)
Heparin Unfractionated: 0.35 [IU]/mL (ref 0.30–0.70)
Heparin Unfractionated: 0.4 [IU]/mL (ref 0.30–0.70)

## 2023-03-19 MED ORDER — LACTATED RINGERS IV SOLN: INTRAVENOUS | Status: AC

## 2023-03-19 MED ORDER — LORAZEPAM 2 MG/ML IJ SOLN
0.5000 mg | INTRAMUSCULAR | Status: DC | PRN
  Administered 2023-03-19 – 2023-03-20 (×2): 0.5 mg via INTRAVENOUS
  Filled 2023-03-19 (×3): qty 1

## 2023-03-19 MED ORDER — IPRATROPIUM-ALBUTEROL 0.5-2.5 (3) MG/3ML IN SOLN
3.0000 mL | RESPIRATORY_TRACT | Status: DC
  Administered 2023-03-19 – 2023-03-31 (×76): 3 mL via RESPIRATORY_TRACT
  Filled 2023-03-19 (×75): qty 3

## 2023-03-19 MED ORDER — MIDAZOLAM-SODIUM CHLORIDE 100-0.9 MG/100ML-% IV SOLN
2.0000 mg/h | INTRAVENOUS | Status: AC
  Administered 2023-03-19: 0.5 mg/h via INTRAVENOUS
  Filled 2023-03-19: qty 100

## 2023-03-19 NOTE — Plan of Care (Signed)
  Problem: Education: Goal: Knowledge of General Education information will improve Description: Including pain rating scale, medication(s)/side effects and non-pharmacologic comfort measures Outcome: Not Progressing   Problem: Health Behavior/Discharge Planning: Goal: Ability to manage health-related needs will improve Outcome: Not Progressing   Problem: Clinical Measurements: Goal: Will remain free from infection Outcome: Not Progressing Goal: Diagnostic test results will improve Outcome: Not Progressing Goal: Respiratory complications will improve Outcome: Not Progressing

## 2023-03-19 NOTE — Progress Notes (Signed)
Checked upon patient  Tachypneic in the 30s  Received Ativan 0.5 which seems to be calming patient  Spoke with daughter at bedside  Agreeable to BiPAP  Risk of needing to intubate remains significant

## 2023-03-19 NOTE — Progress Notes (Signed)
Dr. Thomasena Edis  called regarding chest x-ray, wanted to Lt lower lobe  chest pt by bed percussion to open up Lt chest colapsed. Notified resp.tx.

## 2023-03-19 NOTE — Procedures (Signed)
Procedures D/C Right Radial Aline per MD order. No complications noted, site well dressed, pulses present.

## 2023-03-19 NOTE — Progress Notes (Signed)
   03/19/23 0825  BiPAP/CPAP/SIPAP  BiPAP/CPAP/SIPAP (S)  SERVO (Placed on standby. HHFNC initiated @ 50L and 55%, medium nasal pillows. Sp02 95%.)  BiPAP/CPAP /SiPAP Vitals  Pulse Rate (!) 111  Resp (!) 26  SpO2 94 %  MEWS Score/Color  MEWS Score 5  MEWS Score Color Red

## 2023-03-19 NOTE — Progress Notes (Signed)
Pharmacy Antibiotic Note  Karen [name] is a 71 y.o. female admitted on 03/08/2023 with altered mental status and hypoxia. She was placed on BiPAP and later intubated. Imaging revealed scattered subsegmental PE. Initial infectious work broad and unrevealing, antibiotics initially narrowed and then stopped after 5 day course. Tracheal aspirate previously collected and now revealing staph aureus. Pharmacy has been consulted for vancomycin dosing.  Plan: -Continue vancomycin 1000 mg IV q24h -Follow renal function, cultures/sensitivities and clinical progress for dose adjustments and de-escalation as indicated  Height: 5\' 4"  (162.6 cm) Weight: 62.9 kg (138 lb 10.7 oz) IBW/kg (Calculated) : 54.7  Temp (24hrs), Avg:99.9 F (37.7 C), Min:96.8 F (36 C), Max:100.7 F (38.2 C)  Recent Labs  Lab 03/13/23 0445 03/14/23 0214 03/15/23 0454 03/16/23 0504 03/17/23 0358 03/18/23 0455 03/19/23 0037  WBC 7.0 6.0 8.7 8.2 10.6* 9.7 10.6*  CREATININE 0.33* 0.51 0.33* <0.30*  --  0.34*  --     Estimated Creatinine Clearance: 56.5 mL/min (A) (by C-G formula based on SCr of 0.34 mg/dL (L)).    Allergies  Allergen Reactions   Zoloft [Sertraline Hcl] Anxiety and Other (See Comments)    "crazy feeling"    Antimicrobials this admission: Vancomycin 11/22 >> 11/25, 11/28 >> Ceftriaxone 11/22 >> 11/26 Ampicillin 11/22 >> 11/23 Acyclovir 11/22 >> 11/23  Dose adjustments this admission: NA  Microbiology results: 11/25 Trach aspirate: staph aureus, pending sensi; candida albicans 11/20 BCx: ngF 11/21 MRSA PCR: negative  Adalberto Cole, PharmD, BCPS 03/19/2023 2:37 PM

## 2023-03-19 NOTE — Plan of Care (Signed)

## 2023-03-19 NOTE — Progress Notes (Signed)
   03/19/23 0848  Oxygen Therapy/Pulse Ox  O2 Device HFNC  O2 Flow Rate (L/min) (S)  60 L/min  FiO2 (%) (S)  60 %  SpO2 95 %

## 2023-03-19 NOTE — Progress Notes (Signed)
NAME:  Karen [name], MRN:  [id number], DOB:  11-01-1950, LOS: 11 ADMISSION DATE:  03/08/2023, CONSULTATION DATE:  03/08/23  REFERRING MD: MD Halivand CHIEF COMPLAINT:  AMS, hypoxia   History of Present Illness:  Pt is a 72 yr old female with a significant pmh of HTN, Hypothyroidism, Major Depressive Disorder, cognitive impairment, arthritis, hearing loss, 30-40lb unintentional weight loss since 05/2022. Per patient's husband and daughter endorse that patient has had a functional and cognitive decline since the beginning of the 2024. Patient has had an extensive workup in regards to cognitive impairment and unintentional weight loss with MRI Brain, MRCPs, CT of chest and abdomen with no definitive etiology.   Patient presented to [facility] ED with AMS and hypoxia (O2 Sats in 70s-80s) by EMS from [facility], who was scheduled for a colonoscopy on 11/20. Patient did not receive colonoscopy due to hypoxia and AMS. Initial VBG revealed pH 7.17 and pCO2 > 123. Patient was given narcan during the initial ED workup with no response and subsequently placed on BIPAP due to increasing hypoxia and respiratory distress. Repeat VBG showed no improvement and patient was intubated due to hypoxia/hypercarbia. Also, CT of chest obtained by EDP showed evidence of scattered subsegmental PE and PCCM was consulted for further vent management as well as management for PE.   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   Significant Hospital Events: Including procedures, antibiotic start and stop dates in addition to other pertinent events   11/20 Admit with AMS, acute hypoxic/hypercarbic respiratory failure, subsegmental PE intubated PCCM consult 11/21 Intubated, sedated, tremors  11/22 Febrile, tachycardic, shivering/tremors seen with physical touch. IR LP obtained 11/23 Neuro consulted. MRI brain ordered 11/28 weaning but high respiratory rate, pressure support of 10 11/30 extubated.  Requiring BiPAP.  Transfused 1 unit packed red cells  Interim History / Subjective:   Awake alert interactive, less tachypneic, less tachycardic Remains very frail  Objective   Blood pressure 130/62, pulse (!) 105, temperature (!) 96.8 F (36 C), temperature source Bladder, resp. rate (!) 37, height 5\' 4"  (1.626 m), weight 62.9 kg, SpO2 95%.    Vent Mode: BIPAP FiO2 (%):  [40 %-100 %] 65 % Set Rate:  [15 bmp] 15 bmp PEEP:  [5 cmH20] 5 cmH20   Intake/Output Summary (Last 24 hours) at 03/19/2023 1354 Last data filed at 03/19/2023 0804 Gross per 24 hour  Intake 1526.36 ml  Output 940 ml  Net 586.36 ml   Filed Weights   03/17/23 0500 03/18/23 0500 03/19/23 0327  Weight: 62.7 kg 64.1 kg 62.9 kg   Physical Exam: General: Elderly lady, does not appear to be an extremis HEENT: Moist oral mucosa, on high flow nasal cannula Neuro: Easily arousable, follows commands CV: S1-S2 appreciated PULM: Diminished breath sounds at the bases GI: Soft, NT, ND Extremities: No significant edema, no deformity Skin: no rashes or lesions    Resolved Hospital Problem list   Hypernatremia  Hypophosphatemia   Assessment & Plan:   Acute hypoxemic  respiratory failure Subsegmental PEs -Extubated 12/30 2024 -Requiring BiPAP - Chest x-ray  reviewed showing probable atelectasis left base -Has been requiring BiPAP -Follow-up chest x-ray in a.m.  Continue heparin for PEs  Encephalopathy is improving Negative MRI -Home Aricept renewed  Failure to thrive Protein calorie malnutrition -Was on tube feeds -Will resume oral intake as she gets more stable  History of hypertension Hyperlipidemia Plan to reinitiate antihypertensives when more stable  Hypothyroidism -Continue Synthroid  History of depression/anxiety -Continue to monitor -On Ativan as needed  Updated family members at bedside  Patient is very frail Risk of reintubation  Best Practice (right click and "Reselect all SmartList Selections" daily)   Diet/type: N.p.o. may have meds, speech eval in a.m. DVT prophylaxis: systemic heparin Pressure ulcer(s). None present GI prophylaxis: PPI Lines: N/A Foley:  Yes, and it is still needed Code Status:  full code Last date of multidisciplinary goals of care discussion: Updated daughter at bedside  The patient is critically ill with multiple organ systems failure and requires high complexity decision making for assessment and support, frequent evaluation and titration of therapies, application of advanced monitoring technologies and extensive interpretation of multiple databases. Critical Care Time devoted to patient care services described in this note independent of APP/resident time (if applicable)  is 31 minutes.   Virl Diamond MD [facility] Personal pager: See Amion If unanswered, please page CCM On-call: [phone]

## 2023-03-19 NOTE — Progress Notes (Signed)
eLink Physician-Brief Progress Note Patient Name: Karen [name] DOB: 18-May-1950 MRN: [id number]   Date of Service  03/19/2023  HPI/Events of Note    eICU Interventions     Subsegmental PE Hypoxemia Improved that she is now on FIO2 100% with O2 sat:100% Bipap 15/5 Needs bed percussion (CXR with LL complete atelectasis And Rll atelectasis/infiltrate If collapse persists/hypoxemia recurs then pt will need re intubation Discussed with RN     Massie Maroon 03/19/2023, 5:42 AM

## 2023-03-19 NOTE — Progress Notes (Signed)
eLink Physician-Brief Progress Note Patient Name: Karen [name] DOB: 12-08-1950 MRN: [id number]   Date of Service  03/19/2023  HPI/Events of Note    eICU Interventions      Recent cognitive decline Admitted with subsegmental PE  Now on BiPAP and heparin drip Oliguric Hypozemic Plan: CXR ABG LR at 75 ml per hour      Massie Maroon 03/19/2023, 4:11 AM

## 2023-03-19 NOTE — Progress Notes (Signed)
eLink Physician-Brief Progress Note Patient Name: Lisi Carvelli DOB: 1950/12/18 MRN: [id number]   Date of Service  03/19/2023  HPI/Events of Note    eICU Interventions     Bilat LL atelectasis LT>Rt Discussed with RT  To start Medineb with BS (alb/iprtaropium) every 4 hrs Continue BiPAP     Kareemah Grounds 03/19/2023, 6:03 AM

## 2023-03-19 NOTE — Progress Notes (Signed)
PHARMACY - ANTICOAGULATION CONSULT NOTE  Pharmacy Consult for heparin Indication: acute pulmonary embolus  Allergies  Allergen Reactions   Zoloft [Sertraline Hcl] Anxiety and Other (See Comments)    "crazy feeling"    Patient Measurements: Height: 5\' 4"  (162.6 cm) Weight: 64.1 kg (141 lb 5 oz) IBW/kg (Calculated) : 54.7 Heparin Dosing Weight: TBW  Vital Signs: Temp: 99.3 F (37.4 C) (12/01 0022) Temp Source: Bladder (12/01 0022) BP: 129/65 (12/01 0200) Pulse Rate: 118 (12/01 0200)  Labs: Recent Labs    03/16/23 0504 03/17/23 0358 03/18/23 0455 03/18/23 1614 03/19/23 0037  HGB 8.3* 8.3* 7.0*  --  9.5*  HCT 27.4* 27.7* 23.5*  --  31.8*  PLT 292 433* 440*  --  495*  HEPARINUNFRC 0.47 0.41 0.27* 0.49 0.35  CREATININE <0.30*  --  0.34*  --   --     Estimated Creatinine Clearance: 56.5 mL/min (A) (by C-G formula based on SCr of 0.34 mg/dL (L)).   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 Three variants of uncertain significance were detected - one in the AXIN2 gene called c.1829G>A, one in the PMS2 gene called c.964G>A, and one in the RNF43 gene called c.1114C>T. The report date is 06/24/2019.     The Multi-Cancer Panel offered by [facility] includes sequencing and/or deletion duplication test   GERD (gastroesophageal reflux disease)    History of colonic polyps    Hyperlipidemia    Hypertension    Hypothyroidism    Major depressive  disorder    Osteoarthritis of knee 07/20/2021   Subjective tinnitus of both ears 03/26/2019   Tick bite 08/2015   UTI (urinary tract infection) 10/2015   Assessment: 72 YO female presenting for a colonoscopy procedure 11/20 with SpO2 70-80% and slight shortness of breath. She was transferred to the [facility] ED where CTA chest revealed scattered subsegmental pulmonary emboli throughout the right lung, no RHS. She was intubated in the ED due to lack of improvement on BiPAP. No anticoagulation noted PTA. Pharmacy consulted for heparin dosing.   Significant events: -11/21: LE Doppler: Findings consistent with age indeterminate DVT involving the left peroneal veins.  -11/22: lumbar puncture by IR  -11/30: extubated  Today, 03/19/2023: -confirmatory level 0.35, therapeutic on 1200 units/hr Hgb 9, plts 495 No complications of therapy noted  Goal of Therapy:  Heparin level 0.3-0.7 units/ml Monitor platelets by anticoagulation protocol: Yes   Plan:  -Continue heparin infusion at 1200 units/hr -Daily CBC, heparin level -Monitor closely for s/sx of bleeding  Arley Phenix RPh 03/19/2023, 2:56 AM

## 2023-03-20 ENCOUNTER — Ambulatory Visit (HOSPITAL_COMMUNITY): Admission: RE | Admit: 2023-03-20 | Payer: Medicare HMO | Source: Home / Self Care | Admitting: Gastroenterology

## 2023-03-20 ENCOUNTER — Inpatient Hospital Stay (HOSPITAL_COMMUNITY): Payer: Medicare HMO

## 2023-03-20 ENCOUNTER — Other Ambulatory Visit: Payer: Self-pay

## 2023-03-20 ENCOUNTER — Encounter (HOSPITAL_COMMUNITY): Admission: RE | Payer: Self-pay | Source: Home / Self Care

## 2023-03-20 DIAGNOSIS — J9601 Acute respiratory failure with hypoxia: Secondary | ICD-10-CM | POA: Diagnosis not present

## 2023-03-20 DIAGNOSIS — E785 Hyperlipidemia, unspecified: Secondary | ICD-10-CM

## 2023-03-20 DIAGNOSIS — R634 Abnormal weight loss: Secondary | ICD-10-CM | POA: Diagnosis not present

## 2023-03-20 DIAGNOSIS — I10 Essential (primary) hypertension: Secondary | ICD-10-CM | POA: Diagnosis not present

## 2023-03-20 DIAGNOSIS — J9602 Acute respiratory failure with hypercapnia: Secondary | ICD-10-CM | POA: Diagnosis not present

## 2023-03-20 LAB — BLOOD GAS, ARTERIAL
Acid-Base Excess: 13.3 mmol/L — ABNORMAL HIGH (ref 0.0–2.0)
Acid-Base Excess: 16.6 mmol/L — ABNORMAL HIGH (ref 0.0–2.0)
Bicarbonate: 40.1 mmol/L — ABNORMAL HIGH (ref 20.0–28.0)
Bicarbonate: 40.3 mmol/L — ABNORMAL HIGH (ref 20.0–28.0)
Drawn by: 51133
Drawn by: 29503
Expiratory PAP: 5 cm[H2O]
FIO2: 100 %
Inspiratory PAP: 20 cm[H2O]
MECHVT: 440 mL
Mode: POSITIVE
O2 Content: 70 L/min
O2 Saturation: 98.3 %
O2 Saturation: 99.6 %
PEEP: 10 cmH2O
Patient temperature: 37
Patient temperature: 37
RATE: 15 {breaths}/min
RATE: 24 {breaths}/min
pCO2 arterial: 59 mm[Hg] — ABNORMAL HIGH (ref 32–48)
pCO2 arterial: 43 mm[Hg] (ref 32–48)
pH, Arterial: 7.44 (ref 7.35–7.45)
pH, Arterial: 7.58 — ABNORMAL HIGH (ref 7.35–7.45)
pO2, Arterial: 82 mm[Hg] — ABNORMAL LOW (ref 83–108)
pO2, Arterial: 368 mm[Hg] — ABNORMAL HIGH (ref 83–108)

## 2023-03-20 LAB — BODY FLUID CELL COUNT WITH DIFFERENTIAL
Eos, Fluid: 0 %
Lymphs, Fluid: 0 %
Monocyte-Macrophage-Serous Fluid: 0 % — ABNORMAL LOW (ref 50–90)
Neutrophil Count, Fluid: 100 % — ABNORMAL HIGH (ref 0–25)
Total Nucleated Cell Count, Fluid: 2475 uL — ABNORMAL HIGH (ref 0–1000)

## 2023-03-20 LAB — TYPE AND SCREEN
ABO/RH(D): AB POS
Antibody Screen: NEGATIVE
Unit division: 0

## 2023-03-20 LAB — BPAM RBC
Blood Product Expiration Date: 202412212359
ISSUE DATE / TIME: 202411301738
Unit Type and Rh: 6200

## 2023-03-20 LAB — CBC
HCT: 29.1 % — ABNORMAL LOW (ref 36.0–46.0)
Hemoglobin: 8.6 g/dL — ABNORMAL LOW (ref 12.0–15.0)
MCH: 29.6 pg (ref 26.0–34.0)
MCHC: 29.6 g/dL — ABNORMAL LOW (ref 30.0–36.0)
MCV: 100 fL (ref 80.0–100.0)
Platelets: 598 10*3/uL — ABNORMAL HIGH (ref 150–400)
RBC: 2.91 MIL/uL — ABNORMAL LOW (ref 3.87–5.11)
RDW: 14.6 % (ref 11.5–15.5)
WBC: 12.8 10*3/uL — ABNORMAL HIGH (ref 4.0–10.5)
nRBC: 0 % (ref 0.0–0.2)

## 2023-03-20 LAB — GLUCOSE, CAPILLARY
Glucose-Capillary: 105 mg/dL — ABNORMAL HIGH (ref 70–99)
Glucose-Capillary: 106 mg/dL — ABNORMAL HIGH (ref 70–99)
Glucose-Capillary: 109 mg/dL — ABNORMAL HIGH (ref 70–99)
Glucose-Capillary: 110 mg/dL — ABNORMAL HIGH (ref 70–99)
Glucose-Capillary: 120 mg/dL — ABNORMAL HIGH (ref 70–99)
Glucose-Capillary: 126 mg/dL — ABNORMAL HIGH (ref 70–99)
Glucose-Capillary: 97 mg/dL (ref 70–99)

## 2023-03-20 LAB — HEPARIN LEVEL (UNFRACTIONATED): Heparin Unfractionated: 0.46 [IU]/mL (ref 0.30–0.70)

## 2023-03-20 LAB — PATHOLOGIST SMEAR REVIEW

## 2023-03-20 LAB — PROCALCITONIN: Procalcitonin: 0.42 ng/mL

## 2023-03-20 SURGERY — UPPER ENDOSCOPIC ULTRASOUND (EUS) RADIAL
Anesthesia: Monitor Anesthesia Care

## 2023-03-20 MED ORDER — FENTANYL 2500MCG IN NS 250ML (10MCG/ML) PREMIX INFUSION
50.0000 ug/h | INTRAVENOUS | Status: DC
  Administered 2023-03-20: 50 ug/h via INTRAVENOUS
  Administered 2023-03-20 – 2023-03-21 (×2): 200 ug/h via INTRAVENOUS
  Filled 2023-03-20 (×3): qty 250

## 2023-03-20 MED ORDER — FENTANYL CITRATE PF 50 MCG/ML IJ SOSY
50.0000 ug | PREFILLED_SYRINGE | Freq: Once | INTRAMUSCULAR | Status: AC
  Administered 2023-03-20: 50 ug via INTRAVENOUS

## 2023-03-20 MED ORDER — ORAL CARE MOUTH RINSE
15.0000 mL | OROMUCOSAL | Status: DC
  Administered 2023-03-20 – 2023-03-21 (×10): 15 mL via OROMUCOSAL

## 2023-03-20 MED ORDER — VANCOMYCIN HCL 1250 MG/250ML IV SOLN
1250.0000 mg | INTRAVENOUS | Status: DC
  Administered 2023-03-20 – 2023-03-23 (×4): 1250 mg via INTRAVENOUS
  Filled 2023-03-20 (×4): qty 250

## 2023-03-20 MED ORDER — MIDAZOLAM HCL 2 MG/2ML IJ SOLN: 2.0000 mg | Freq: Once | INTRAMUSCULAR | Status: AC

## 2023-03-20 MED ORDER — FENTANYL CITRATE PF 50 MCG/ML IJ SOSY: 50.0000 ug | PREFILLED_SYRINGE | Freq: Once | INTRAMUSCULAR | Status: AC

## 2023-03-20 MED ORDER — FENTANYL CITRATE (PF) 100 MCG/2ML IJ SOLN
INTRAMUSCULAR | Status: AC
  Filled 2023-03-20: qty 2

## 2023-03-20 MED ORDER — PHENYLEPHRINE 80 MCG/ML (10ML) SYRINGE FOR IV PUSH (FOR BLOOD PRESSURE SUPPORT)
PREFILLED_SYRINGE | INTRAVENOUS | Status: AC
  Administered 2023-03-20: 200 ug via INTRAVENOUS
  Filled 2023-03-20: qty 10

## 2023-03-20 MED ORDER — POLYETHYLENE GLYCOL 3350 17 G PO PACK: 17.0000 g | PACK | Freq: Every day | ORAL | Status: DC | PRN

## 2023-03-20 MED ORDER — ROCURONIUM BROMIDE 10 MG/ML (PF) SYRINGE
PREFILLED_SYRINGE | INTRAVENOUS | Status: AC
  Administered 2023-03-20: 50 mg via INTRAVENOUS
  Filled 2023-03-20: qty 10

## 2023-03-20 MED ORDER — MIDAZOLAM HCL 2 MG/2ML IJ SOLN
INTRAMUSCULAR | Status: AC
  Administered 2023-03-20: 2 mg via INTRAVENOUS
  Filled 2023-03-20: qty 2

## 2023-03-20 MED ORDER — LEVOTHYROXINE SODIUM 100 MCG PO TABS
100.0000 ug | ORAL_TABLET | Freq: Every day | ORAL | Status: DC
  Administered 2023-03-21 – 2023-04-05 (×15): 100 ug
  Filled 2023-03-20 (×15): qty 1

## 2023-03-20 MED ORDER — VITAMIN B-6 100 MG PO TABS
100.0000 mg | ORAL_TABLET | Freq: Every day | ORAL | Status: DC
  Administered 2023-03-23 – 2023-04-05 (×13): 100 mg
  Filled 2023-03-20 (×17): qty 1

## 2023-03-20 MED ORDER — FENTANYL CITRATE (PF) 100 MCG/2ML IJ SOLN: 100.0000 ug | Freq: Once | INTRAMUSCULAR | Status: DC

## 2023-03-20 MED ORDER — HYDROXYZINE HCL 25 MG PO TABS: 12.5000 mg | ORAL_TABLET | Freq: Every day | ORAL | Status: DC

## 2023-03-20 MED ORDER — SUCCINYLCHOLINE CHLORIDE 200 MG/10ML IV SOSY
PREFILLED_SYRINGE | INTRAVENOUS | Status: AC
  Filled 2023-03-20: qty 10

## 2023-03-20 MED ORDER — FENTANYL BOLUS VIA INFUSION
50.0000 ug | INTRAVENOUS | Status: DC | PRN
  Administered 2023-03-20: 50 ug via INTRAVENOUS
  Administered 2023-03-20 (×2): 100 ug via INTRAVENOUS
  Administered 2023-03-20: 50 ug via INTRAVENOUS
  Administered 2023-03-20: 100 ug via INTRAVENOUS

## 2023-03-20 MED ORDER — DOCUSATE SODIUM 50 MG/5ML PO LIQD
100.0000 mg | Freq: Two times a day (BID) | ORAL | Status: DC
  Administered 2023-03-20 – 2023-04-05 (×16): 100 mg
  Filled 2023-03-20 (×20): qty 10

## 2023-03-20 MED ORDER — PROPOFOL 1000 MG/100ML IV EMUL
5.0000 ug/kg/min | INTRAVENOUS | Status: DC
  Administered 2023-03-20 (×3): 60 ug/kg/min via INTRAVENOUS
  Administered 2023-03-20: 30 ug/kg/min via INTRAVENOUS
  Administered 2023-03-21: 70 ug/kg/min via INTRAVENOUS
  Administered 2023-03-21: 60 ug/kg/min via INTRAVENOUS
  Filled 2023-03-20 (×4): qty 100
  Filled 2023-03-20: qty 200
  Filled 2023-03-20: qty 100

## 2023-03-20 MED ORDER — OXYCODONE HCL 5 MG PO TABS
5.0000 mg | ORAL_TABLET | Freq: Four times a day (QID) | ORAL | Status: DC
  Administered 2023-03-20 – 2023-03-21 (×3): 5 mg
  Filled 2023-03-20 (×3): qty 1

## 2023-03-20 MED ORDER — PIPERACILLIN-TAZOBACTAM 3.375 G IVPB
3.3750 g | Freq: Three times a day (TID) | INTRAVENOUS | Status: DC
  Administered 2023-03-20 – 2023-03-24 (×12): 3.375 g via INTRAVENOUS
  Filled 2023-03-20 (×12): qty 50

## 2023-03-20 MED ORDER — PHENYLEPHRINE 80 MCG/ML (10ML) SYRINGE FOR IV PUSH (FOR BLOOD PRESSURE SUPPORT): 80.0000 ug | PREFILLED_SYRINGE | Freq: Once | INTRAVENOUS | Status: AC | PRN

## 2023-03-20 MED ORDER — ATROPINE SULFATE 1 MG/10ML IJ SOSY
PREFILLED_SYRINGE | INTRAMUSCULAR | Status: AC
  Filled 2023-03-20: qty 10

## 2023-03-20 MED ORDER — SODIUM BICARBONATE 8.4 % IV SOLN: 50.0000 meq | Freq: Once | INTRAVENOUS | Status: AC

## 2023-03-20 MED ORDER — FENTANYL CITRATE PF 50 MCG/ML IJ SOSY
PREFILLED_SYRINGE | INTRAMUSCULAR | Status: AC
  Administered 2023-03-20: 50 ug via INTRAVENOUS
  Filled 2023-03-20: qty 2

## 2023-03-20 MED ORDER — SODIUM CHLORIDE 0.9% FLUSH
10.0000 mL | INTRAVENOUS | Status: DC | PRN
  Administered 2023-03-20 – 2023-03-26 (×5): 10 mL

## 2023-03-20 MED ORDER — ACETAMINOPHEN 325 MG PO TABS
650.0000 mg | ORAL_TABLET | Freq: Four times a day (QID) | ORAL | Status: DC | PRN
  Administered 2023-03-20 – 2023-04-05 (×8): 650 mg
  Filled 2023-03-20 (×8): qty 2

## 2023-03-20 MED ORDER — ROSUVASTATIN CALCIUM 10 MG PO TABS
10.0000 mg | ORAL_TABLET | Freq: Every day | ORAL | Status: DC
  Administered 2023-03-22 – 2023-04-05 (×14): 10 mg
  Filled 2023-03-20 (×16): qty 1

## 2023-03-20 MED ORDER — POLYETHYLENE GLYCOL 3350 17 G PO PACK: 17.0000 g | PACK | Freq: Every day | ORAL | Status: DC

## 2023-03-20 MED ORDER — NOREPINEPHRINE 4 MG/250ML-% IV SOLN
0.0000 ug/min | INTRAVENOUS | Status: DC
  Administered 2023-03-20: 7 ug/min via INTRAVENOUS
  Administered 2023-03-20: 2 ug/min via INTRAVENOUS
  Administered 2023-03-20: 6 ug/min via INTRAVENOUS
  Filled 2023-03-20 (×4): qty 250

## 2023-03-20 MED ORDER — MIRTAZAPINE 15 MG PO TABS
30.0000 mg | ORAL_TABLET | Freq: Every day | ORAL | Status: DC
  Administered 2023-03-20: 30 mg
  Filled 2023-03-20 (×2): qty 2

## 2023-03-20 MED ORDER — ORAL CARE MOUTH RINSE: 15.0000 mL | OROMUCOSAL | Status: DC | PRN

## 2023-03-20 MED ORDER — ETOMIDATE 2 MG/ML IV SOLN
INTRAVENOUS | Status: AC
  Administered 2023-03-20: 20 mg via INTRAVENOUS
  Filled 2023-03-20: qty 20

## 2023-03-20 MED ORDER — ROCURONIUM BROMIDE 10 MG/ML (PF) SYRINGE: 50.0000 mg | PREFILLED_SYRINGE | Freq: Once | INTRAVENOUS | Status: AC

## 2023-03-20 MED ORDER — SODIUM BICARBONATE 8.4 % IV SOLN
INTRAVENOUS | Status: AC
  Administered 2023-03-20: 50 meq via INTRAVENOUS
  Filled 2023-03-20: qty 50

## 2023-03-20 MED ORDER — FENTANYL CITRATE PF 50 MCG/ML IJ SOSY
25.0000 ug | PREFILLED_SYRINGE | INTRAMUSCULAR | Status: DC | PRN
  Administered 2023-03-20 (×2): 25 ug via INTRAVENOUS
  Filled 2023-03-20: qty 1

## 2023-03-20 MED ORDER — FENTANYL CITRATE PF 50 MCG/ML IJ SOSY
PREFILLED_SYRINGE | INTRAMUSCULAR | Status: AC
  Filled 2023-03-20: qty 1

## 2023-03-20 MED ORDER — MIDAZOLAM HCL 2 MG/2ML IJ SOLN
4.0000 mg | Freq: Once | INTRAMUSCULAR | Status: AC
  Administered 2023-03-20: 4 mg via INTRAVENOUS
  Filled 2023-03-20: qty 4

## 2023-03-20 MED ORDER — DONEPEZIL HCL 10 MG PO TABS
10.0000 mg | ORAL_TABLET | Freq: Every day | ORAL | Status: DC
  Administered 2023-03-20 – 2023-04-04 (×15): 10 mg
  Filled 2023-03-20 (×17): qty 1

## 2023-03-20 MED ORDER — ETOMIDATE 2 MG/ML IV SOLN: 20.0000 mg | Freq: Once | INTRAVENOUS | Status: AC

## 2023-03-20 MED ORDER — SODIUM CHLORIDE 0.9% FLUSH
10.0000 mL | Freq: Two times a day (BID) | INTRAVENOUS | Status: DC
Start: 2023-03-20 — End: 2023-04-05
  Administered 2023-03-20 – 2023-03-23 (×8): 10 mL
  Administered 2023-03-24: 30 mL
  Administered 2023-03-24 – 2023-03-25 (×3): 10 mL
  Administered 2023-03-26: 20 mL
  Administered 2023-03-26: 10 mL
  Administered 2023-03-27: 20 mL
  Administered 2023-03-27 – 2023-04-02 (×13): 10 mL
  Administered 2023-04-03 – 2023-04-04 (×3): 40 mL
  Administered 2023-04-05: 10 mL

## 2023-03-20 NOTE — Progress Notes (Addendum)
eLink Physician-Brief Progress Note Patient Name: Gertrud Belvin DOB: 22-Mar-1951 MRN: [id number]   Date of Service  03/20/2023  HPI/Events of Note  Received request for IV pain medications.  Pt was scheduled to get oxy IR 5mg  q6hrs but she has been on BIPAP.   Pt appears tachypneic with RR in the 30s, FiO2 70%, saturating at 96%.    eICU Interventions  Give fentanyl IV PRN.  Check ABG.  Pt is at risk for reintubation.      Intervention Category Intermediate Interventions: Other:  Larinda Buttery 03/20/2023, 1:00 AM  2:26 AM ABG is acceptable on BIPAP - 7.44/59/82.  Plan> Continue on current BIPAP settings.

## 2023-03-20 NOTE — Procedures (Signed)
Bronchoscopy Procedure Note  Karen [name]  [id number]  [birth year]  Date:03/20/23  Time:8:47 AM   Provider Performing:Dickie Labarre C Katrinka Blazing   Procedure(s):  Flexible bronchoscopy with bronchial alveolar lavage [phone]) and Initial Therapeutic Aspiration of Tracheobronchial Tree [phone])  Indication(s) Mucus plugging of bronchi  Consent Unable to obtain consent due to emergent nature of procedure.  Anesthesia In place for intubation   Time Out Verified patient identification, verified procedure, site/side was marked, verified correct patient position, special equipment/implants available, medications/allergies/relevant history reviewed, required imaging and test results available.   Sterile Technique Usual hand hygiene, masks, gowns, and gloves were used   Procedure Description Bronchoscope advanced through endotracheal tube and into airway.  Airways were examined down to subsegmental level with findings noted below.   Following diagnostic evaluation, BAL(s) performed in LLL with normal saline and return of plug-filled fluid and Therapeutic aspiration performed in trachea, right/mainstem bronchi, RUL, LUL, Lingula, Bronchus intermedius  Findings:  - Large mucus plugs nearly completely occlusive of both right and left mainstem bronchi - This mucus originated from L mainstem, required copious irrigation and suction to remove down to segmental bronchus - Mild suction trauma - ETT in good position   Complications/Tolerance None; patient tolerated the procedure well. Chest X-ray is needed post procedure.   EBL Minimal   Specimen(s) LLL BAL

## 2023-03-20 NOTE — Progress Notes (Signed)
NAME:  Karen [name], MRN:  [id number], DOB:  April 20, 1950, LOS: 12 ADMISSION DATE:  03/08/2023, CONSULTATION DATE:  03/08/23  REFERRING MD: MD Halivand CHIEF COMPLAINT:  AMS, hypoxia   History of Present Illness:  Pt is a 72 yr old female with a significant pmh of HTN, Hypothyroidism, Major Depressive Disorder, cognitive impairment, arthritis, hearing loss, 30-40lb unintentional weight loss since 05/2022. Per patient's husband and daughter endorse that patient has had a functional and cognitive decline since the beginning of the 2024. Patient has had an extensive workup in regards to cognitive impairment and unintentional weight loss with MRI Brain, MRCPs, CT of chest and abdomen with no definitive etiology.   Patient presented to [facility] ED with AMS and hypoxia (O2 Sats in 70s-80s) by EMS from [facility], who was scheduled for a colonoscopy on 11/20. Patient did not receive colonoscopy due to hypoxia and AMS. Initial VBG revealed pH 7.17 and pCO2 > 123. Patient was given narcan during the initial ED workup with no response and subsequently placed on BIPAP due to increasing hypoxia and respiratory distress. Repeat VBG showed no improvement and patient was intubated due to hypoxia/hypercarbia. Also, CT of chest obtained by EDP showed evidence of scattered subsegmental PE and PCCM was consulted for further vent management as well as management for PE.   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   Significant Hospital Events: Including procedures, antibiotic start and stop dates in addition to other pertinent events   11/20 Admit with AMS, acute hypoxic/hypercarbic respiratory failure, subsegmental PE intubated PCCM consult 11/21 Intubated, sedated, tremors  11/22 Febrile, tachycardic, shivering/tremors seen with physical touch. IR LP obtained 11/23 Neuro consulted. MRI brain ordered 11/28 weaning but high respiratory rate, pressure support of 10 11/30 extubated.  Requiring BiPAP.  Transfused 1 unit packed red cells 12/2 mucus plug reintubated, bronch  Interim History / Subjective:  Rough morning, extensive mucus plugging on left leading to progressive resp failure  Objective   Blood pressure (!) 146/65, pulse (!) 118, temperature 100 F (37.8 C), temperature source Bladder, resp. rate (!) 33, height 5\' 4"  (1.626 m), weight 63.2 kg, SpO2 97%.    Vent Mode: PRVC FiO2 (%):  [60 %-100 %] 100 % Set Rate:  [15 bmp-24 bmp] 24 bmp Vt Set:  [440 mL] 440 mL PEEP:  [5 cmH20-10 cmH20] 10 cmH20 Pressure Support:  [15 cmH20] 15 cmH20 Plateau Pressure:  [25 cmH20] 25 cmH20   Intake/Output Summary (Last 24 hours) at 03/20/2023 0951 Last data filed at 03/20/2023 0606 Gross per 24 hour  Intake 2109.49 ml  Output 685 ml  Net 1424.49 ml   Filed Weights   03/18/23 0500 03/19/23 0327 03/20/23 0439  Weight: 64.1 kg 62.9 kg 63.2 kg   Physical Exam: Pre intubation: moves all ext, anxious, +accessory muscle use, absent breath sounds on left, trace edema, nods head appropriately  No new labs CXR whiteout L lung  Resolved Hospital Problem list   Hypernatremia  Hypophosphatemia   Assessment &  Plan:   Acute hypoxemic respiratory failure- recurrent, initially seems to be driven by Pe's and  encephalopathy.  Now with mucus plugging r/o HCAP. FTT, weight loss, cognitive disorder NOS- unrevealing workup to date, query developing dementia +/- chronic aspiration syndrome HTN, HLD, Hypothyroidism, Depression, anxiety  - Vent bundle - Let rest today, can re-extubate tomorrow if doing well - Start HCAP coverage, check Pct and BAL - Heparin gtt for now, consider NoAC once procedures done - Prognosis guarded in light of the FTT and memory issues PTA - Family updated at bedside  Best Practice (right click and "Reselect all SmartList Selections" daily)   Diet/type: OGT to be placed, fine for TF DVT prophylaxis: systemic heparin Pressure ulcer(s). None present GI prophylaxis: PPI Lines: N/A Foley:  Yes, and it is still needed Code Status:  full code Last date of multidisciplinary goals of care discussion: Updated family at bedside  The patient is critically ill with multiple organ systems failure and requires high complexity decision making for assessment and support, frequent evaluation and titration of therapies, application of advanced monitoring technologies and extensive interpretation of multiple databases. Critical Care Time devoted to patient care services described in this note independent of APP/resident time (if applicable)  is 33  minutes.   Myrla Halsted MD [facility] Personal pager: See Loretha Stapler If unanswered, please page CCM On-call: [phone]

## 2023-03-20 NOTE — Progress Notes (Signed)
PHARMACY - ANTICOAGULATION CONSULT NOTE  Pharmacy Consult for heparin Indication: acute pulmonary embolus  Allergies  Allergen Reactions   Zoloft [Sertraline Hcl] Anxiety and Other (See Comments)    "crazy feeling"    Patient Measurements: Height: 5\' 4"  (162.6 cm) Weight: 63.2 kg (139 lb 5.3 oz) IBW/kg (Calculated) : 54.7 Heparin Dosing Weight: TBW  Vital Signs: Temp: 100.1 F (37.8 C) (12/02 0600) Temp Source: Bladder (12/02 0600) BP: 146/65 (12/02 0600) Pulse Rate: 118 (12/02 0600)  Labs: Recent Labs    03/18/23 0455 03/18/23 1614 03/19/23 0037 03/19/23 0506 03/20/23 0549  HGB 7.0*  --  9.5*  --  8.6*  HCT 23.5*  --  31.8*  --  29.1*  PLT 440*  --  495*  --  598*  HEPARINUNFRC 0.27*   < > 0.35 0.40 0.46  CREATININE 0.34*  --   --   --   --    < > = values in this interval not displayed.    Estimated Creatinine Clearance: 56.5 mL/min (A) (by C-G formula based on SCr of 0.34 mg/dL (L)).   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 Three variants of uncertain significance were detected - one in the AXIN2 gene called c.1829G>A, one in the PMS2 gene called c.964G>A, and one in the RNF43 gene called c.1114C>T. The report date is 06/24/2019.     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 She was transferred to [facility] ED where CTA chest revealed scattered subsegmental pulmonary emboli throughout the right lung, no RHS. She was intubated in the ED due to lack of improvement on BiPAP. No anticoagulation noted PTA. Pharmacy consulted for heparin dosing.   Significant events: -11/21: LE Doppler: Findings consistent with age indeterminate DVT involving the left peroneal veins.  -11/22: lumbar puncture by IR  -11/30: extubated - 12/2 reintubated, bronch  Today, 03/20/2023: - Heparin level 0.46, therapeutic on 1200 units/hr - CBC: Hgb remains low at 8.6 (last transfused on 11/30), Plt remain elevated - No complications of therapy noted, No bleeding reported by RN  Goal of Therapy:  Heparin level 0.3-0.7 units/ml Monitor platelets by anticoagulation protocol: Yes   Plan:  -Continue heparin infusion at 1200 units/hr -Daily CBC, heparin level -Monitor closely for s/sx of bleeding   Lynann Beaver PharmD, BCPS [facility] main pharmacy [phone] 03/20/2023 8:34 AM

## 2023-03-20 NOTE — Plan of Care (Signed)
  Problem: Education: Goal: Knowledge of General Education information will improve Description: Including pain rating scale, medication(s)/side effects and non-pharmacologic comfort measures Outcome: Progressing   Problem: Clinical Measurements: Goal: Will remain free from infection Outcome: Progressing Goal: Diagnostic test results will improve Outcome: Progressing Goal: Cardiovascular complication will be avoided Outcome: Progressing   Problem: Elimination: Goal: Will not experience complications related to urinary retention Outcome: Progressing   Problem: Pain Management: Goal: General experience of comfort will improve Outcome: Progressing   Problem: Safety: Goal: Ability to remain free from injury will improve Outcome: Progressing   Problem: Skin Integrity: Goal: Risk for impaired skin integrity will decrease Outcome: Progressing   Problem: Safety: Goal: Non-violent Restraint(s) Outcome: Progressing

## 2023-03-20 NOTE — TOC Progression Note (Signed)
Transition of Care [facility]) - Progression Note    Patient Details  Name: Karen [name] MRN: [id number] Date of Birth: 1950/06/26  Transition [facility]) CM/SW Contact  Darleene Cleaver, [place] Phone Number: 03/20/2023, 5:10 PM  Clinical Narrative:     TCC continuing to follow patient's progress throughout discharge planning.  Patient still intubated, may need SNF or home health once medically ready.  Expected Discharge Plan: Home/Self Care Barriers to Discharge: Continued Medical Work up  Expected Discharge Plan and Services   Discharge Planning Services: CM Consult   Living arrangements for the past 2 months: Single Family Home                                       Social Determinants of Health (SDOH) Interventions SDOH Screenings   Food Insecurity: No Food Insecurity (03/09/2023)  Housing: Patient Unable To Answer (03/09/2023)  Transportation Needs: No Transportation Needs (03/09/2023)  Utilities: Not At Risk (03/09/2023)  Depression (PHQ2-9): Low Risk  (03/23/2022)  Tobacco Use: Medium Risk (03/18/2023)    Readmission Risk Interventions    03/09/2023    1:14 PM  Readmission Risk Prevention Plan  Transportation Screening Complete  PCP or Specialist Appt within 5-7 Days Complete  Home Care Screening Complete  Medication Review (RN CM) Complete

## 2023-03-20 NOTE — Progress Notes (Signed)
ABG dropped off at lab.

## 2023-03-20 NOTE — Progress Notes (Signed)
Peripherally Inserted Central Catheter Placement  The IV Nurse has discussed with the patient and/or persons authorized to consent for the patient, the purpose of this procedure and the potential benefits and risks involved with this procedure.  The benefits include less needle sticks, lab draws from the catheter, and the patient may be discharged home with the catheter. Risks include, but not limited to, infection, bleeding, blood clot (thrombus formation), and puncture of an artery; nerve damage and irregular heartbeat and possibility to perform a PICC exchange if needed/ordered by physician.  Alternatives to this procedure were also discussed.  Bard Power PICC patient education guide, fact sheet on infection prevention and patient information card has been provided to patient /or left at bedside.    PICC Placement Documentation  PICC Triple Lumen 03/20/23 Right Brachial 35 cm 0 cm (Active)  Indication for Insertion or Continuance of Line Vasoactive infusions 03/20/23 1132  Exposed Catheter (cm) 0 cm 03/20/23 1132  Site Assessment Clean, Dry, Intact 03/20/23 1132  Lumen #1 Status Flushed;Saline locked;Blood return noted 03/20/23 1132  Lumen #2 Status Flushed;Saline locked;Blood return noted 03/20/23 1132  Lumen #3 Status Flushed;Saline locked;Blood return noted 03/20/23 1132  Dressing Type Transparent;Securing device 03/20/23 1132  Dressing Status Antimicrobial disc in place;Clean, Dry, Intact 03/20/23 1132  Line Care Connections checked and tightened 03/20/23 1132  Line Adjustment (NICU/IV Team Only) No 03/20/23 1132  Dressing Intervention New dressing;Adhesive placed at insertion site (IV team only);Other (Comment) 03/20/23 1132  Dressing Change Due 03/27/23 03/20/23 1132    Patient's daughter, Martha Glende, gave  PICC consent via telephone. Verified by [facility] RNs.   Annett Fabian 03/20/2023, 11:33 AM

## 2023-03-20 NOTE — Procedures (Signed)
Intubation Procedure Note  Adisson Luebbering  [id number]  12/04/1950  Date:03/20/23  Time:8:45 AM   Provider Performing:Jayleene Glaeser C Katrinka Blazing    Procedure: Intubation (31500)  Indication(s) Respiratory Failure  Consent Verbal   Anesthesia Etomidate, Versed, Fentanyl, and Rocuronium   Time Out Verified patient identification, verified procedure, site/side was marked, verified correct patient position, special equipment/implants available, medications/allergies/relevant history reviewed, required imaging and test results available.   Sterile Technique Usual hand hygeine, masks, and gloves were used   Procedure Description Patient positioned in bed supine.  Sedation given as noted above.  Patient was intubated with endotracheal tube using Glidescope.  View was Grade 2 only posterior commissure .  Number of attempts was 1.  Colorimetric CO2 detector was consistent with tracheal placement.   Complications/Tolerance Huston Foley and tough to bag due to plugging: given neo + bicarb to temporize, no pulse lost Chest X-ray is ordered to verify placement.   EBL Minimal   Specimen(s) None

## 2023-03-20 NOTE — Progress Notes (Signed)
eLink Physician-Brief Progress Note Patient Name: Karen [name] DOB: January 16, 1951 MRN: [id number]   Date of Service  03/20/2023  HPI/Events of Note  72 yr old female with a significant pmh of HTN, Hypothyroidism, Major Depressive Disorder, cognitive impairment, arthritis, hearing loss, 30-40lb unintentional weight loss since 05/2022.  Minimal UOP, +936 for 24h, +11.3L for stay  eICU Interventions  Fluids expired, continue to monitor.      Intervention Category Minor Interventions: Routine modifications to care plan (e.g. PRN medications for pain, fever)  Lexany Belknap 03/20/2023, 9:34 PM

## 2023-03-20 NOTE — Progress Notes (Signed)
Pharmacy Antibiotic Note  Karen [name] is a 72 y.o. female admitted on 03/08/2023 with AMS and hypoxia.  Initial antibiotics narrowed and then stopped after 5 day course, Vanc resumed 11/28 for tracheal aspirate with staph aureus.  She was re-intubated on 12/2 and Pharmacy has been consulted for Vancomycin, Zosyn dosing.  Plan: Zosyn 3.375g IV Q8H infused over 4hrs.  Change to Vancomycin 1250 mg IV q24h.  (SCr rounded to 0.8, est AUC 535) Measure Vanc levels as needed.  Goal AUC = 400 - 550. Follow up renal function, culture results, and clinical course.   Height: 5\' 4"  (162.6 cm) Weight: 63.2 kg (139 lb 5.3 oz) IBW/kg (Calculated) : 54.7  Temp (24hrs), Avg:99.5 F (37.5 C), Min:98.7 F (37.1 C), Max:100.1 F (37.8 C)  Recent Labs  Lab 03/14/23 0214 03/15/23 0454 03/16/23 0504 03/17/23 0358 03/18/23 0455 03/19/23 0037 03/20/23 0549  WBC 6.0 8.7 8.2 10.6* 9.7 10.6* 12.8*  CREATININE 0.51 0.33* <0.30*  --  0.34*  --   --     Estimated Creatinine Clearance: 56.5 mL/min (A) (by C-G formula based on SCr of 0.34 mg/dL (L)).    Allergies  Allergen Reactions   Zoloft [Sertraline Hcl] Anxiety and Other (See Comments)    "crazy feeling"    Antimicrobials this admission: 11/22 vancomycin >> 11/25, 11/28 >>  11/22 ampicillin >> 11/23 11/22 ceftriaxone >> 11/26 11/22 acyclovir >> 11/23 12/2 Zosyn >>   Dose adjustments this admission: 12/2 Vanc renal dose adjustment  Microbiology results: 11/20 BCx: NGF 11/20 resp panel: negative 11/21 MRSA PCR: negative 11/22 CSF culture: NGF 11/21 meningitis/encephalitis: negative 11/25 Trach aspirate: rare staph aureus, few C. Albicans  12/2 BAL:   Thank you for allowing pharmacy to be a part of this patient's care.  Lynann Beaver PharmD, BCPS [facility] main pharmacy [phone] 03/20/2023 9:36 AM

## 2023-03-21 ENCOUNTER — Inpatient Hospital Stay (HOSPITAL_COMMUNITY): Payer: Medicare HMO

## 2023-03-21 DIAGNOSIS — I952 Hypotension due to drugs: Secondary | ICD-10-CM | POA: Diagnosis not present

## 2023-03-21 DIAGNOSIS — J9601 Acute respiratory failure with hypoxia: Secondary | ICD-10-CM | POA: Diagnosis not present

## 2023-03-21 DIAGNOSIS — E876 Hypokalemia: Secondary | ICD-10-CM

## 2023-03-21 DIAGNOSIS — R404 Transient alteration of awareness: Secondary | ICD-10-CM | POA: Diagnosis not present

## 2023-03-21 LAB — BLOOD GAS, ARTERIAL
Acid-Base Excess: 9.5 mmol/L — ABNORMAL HIGH (ref 0.0–2.0)
Bicarbonate: 37.3 mmol/L — ABNORMAL HIGH (ref 20.0–28.0)
Drawn by: 213381
Expiratory PAP: 5 cm[H2O]
FIO2: 40 %
Inspiratory PAP: 8 cm[H2O]
Mode: POSITIVE
O2 Saturation: 98.7 %
Patient temperature: 37.1
RATE: 18 {breaths}/min
pCO2 arterial: 74 mm[Hg] (ref 32–48)
pH, Arterial: 7.31 — ABNORMAL LOW (ref 7.35–7.45)
pO2, Arterial: 79 mm[Hg] — ABNORMAL LOW (ref 83–108)

## 2023-03-21 LAB — GLUCOSE, CAPILLARY
Glucose-Capillary: 102 mg/dL — ABNORMAL HIGH (ref 70–99)
Glucose-Capillary: 106 mg/dL — ABNORMAL HIGH (ref 70–99)
Glucose-Capillary: 115 mg/dL — ABNORMAL HIGH (ref 70–99)
Glucose-Capillary: 146 mg/dL — ABNORMAL HIGH (ref 70–99)
Glucose-Capillary: 73 mg/dL (ref 70–99)
Glucose-Capillary: 81 mg/dL (ref 70–99)

## 2023-03-21 LAB — BASIC METABOLIC PANEL
Anion gap: 6 (ref 5–15)
Anion gap: 6 (ref 5–15)
BUN: 23 mg/dL (ref 8–23)
BUN: 22 mg/dL (ref 8–23)
CO2: 31 mmol/L (ref 22–32)
CO2: 32 mmol/L (ref 22–32)
Calcium: 9.8 mg/dL (ref 8.9–10.3)
Calcium: 9.1 mg/dL (ref 8.9–10.3)
Chloride: 103 mmol/L (ref 98–111)
Chloride: 98 mmol/L (ref 98–111)
Creatinine, Ser: 0.63 mg/dL (ref 0.44–1.00)
Creatinine, Ser: 0.72 mg/dL (ref 0.44–1.00)
GFR, Estimated: >60 mL/min (ref >60)
GFR, Estimated: >60 mL/min (ref >60)
Glucose, Bld: 107 mg/dL — ABNORMAL HIGH (ref 70–99)
Glucose, Bld: 163 mg/dL — ABNORMAL HIGH (ref 70–99)
Potassium: 3.3 mmol/L — ABNORMAL LOW (ref 3.5–5.1)
Potassium: 6 mmol/L — ABNORMAL HIGH (ref 3.5–5.1)
Sodium: 140 mmol/L (ref 135–145)
Sodium: 136 mmol/L (ref 135–145)

## 2023-03-21 LAB — PHOSPHORUS
Phosphorus: 1.4 mg/dL — ABNORMAL LOW (ref 2.5–4.6)
Phosphorus: 7.5 mg/dL — ABNORMAL HIGH (ref 2.5–4.6)

## 2023-03-21 LAB — CBC
HCT: 26 % — ABNORMAL LOW (ref 36.0–46.0)
Hemoglobin: 8.1 g/dL — ABNORMAL LOW (ref 12.0–15.0)
MCH: 30.1 pg (ref 26.0–34.0)
MCHC: 31.2 g/dL (ref 30.0–36.0)
MCV: 96.7 fL (ref 80.0–100.0)
Platelets: 591 10*3/uL — ABNORMAL HIGH (ref 150–400)
RBC: 2.69 MIL/uL — ABNORMAL LOW (ref 3.87–5.11)
RDW: 14.4 % (ref 11.5–15.5)
WBC: 12.3 10*3/uL — ABNORMAL HIGH (ref 4.0–10.5)
nRBC: 0 % (ref 0.0–0.2)

## 2023-03-21 LAB — MAGNESIUM: Magnesium: 1.6 mg/dL — ABNORMAL LOW (ref 1.7–2.4)

## 2023-03-21 LAB — HEPARIN LEVEL (UNFRACTIONATED): Heparin Unfractionated: 0.43 [IU]/mL (ref 0.30–0.70)

## 2023-03-21 MED ORDER — POTASSIUM PHOSPHATES 15 MMOLE/5ML IV SOLN
45.0000 mmol | Freq: Once | INTRAVENOUS | Status: AC
  Administered 2023-03-21: 45 mmol via INTRAVENOUS
  Filled 2023-03-21: qty 15

## 2023-03-21 MED ORDER — MAGNESIUM SULFATE 4 GM/100ML IV SOLN
4.0000 g | Freq: Once | INTRAVENOUS | Status: AC
  Administered 2023-03-21: 4 g via INTRAVENOUS
  Filled 2023-03-21: qty 100

## 2023-03-21 MED ORDER — ORAL CARE MOUTH RINSE
15.0000 mL | OROMUCOSAL | Status: DC
  Administered 2023-03-21 – 2023-03-22 (×5): 15 mL via OROMUCOSAL

## 2023-03-21 MED ORDER — METOPROLOL TARTRATE 5 MG/5ML IV SOLN
5.0000 mg | Freq: Once | INTRAVENOUS | Status: AC
  Administered 2023-03-21: 5 mg via INTRAVENOUS

## 2023-03-21 MED ORDER — NALOXONE HCL 0.4 MG/ML IJ SOLN
0.4000 mg | INTRAMUSCULAR | Status: DC | PRN
  Administered 2023-03-21: 0.4 mg via INTRAVENOUS

## 2023-03-21 MED ORDER — NALOXONE HCL 0.4 MG/ML IJ SOLN
INTRAMUSCULAR | Status: AC
  Filled 2023-03-21: qty 1

## 2023-03-21 MED ORDER — ORAL CARE MOUTH RINSE
15.0000 mL | OROMUCOSAL | Status: DC | PRN
  Administered 2023-03-22: 15 mL via OROMUCOSAL

## 2023-03-21 MED ORDER — METOPROLOL TARTRATE 5 MG/5ML IV SOLN
INTRAVENOUS | Status: AC
  Filled 2023-03-21: qty 5

## 2023-03-21 MED ORDER — METOPROLOL TARTRATE 5 MG/5ML IV SOLN: 2.5000 mg | INTRAVENOUS | Status: DC | PRN

## 2023-03-21 MED ORDER — GLYCOPYRROLATE 0.2 MG/ML IJ SOLN
0.2000 mg | Freq: Three times a day (TID) | INTRAMUSCULAR | Status: DC
  Administered 2023-03-21: 0.2 mg via INTRAVENOUS
  Filled 2023-03-21: qty 1

## 2023-03-21 NOTE — Progress Notes (Addendum)
eLink Physician-Brief Progress Note Patient Name: Karen [name] DOB: March 13, 1951 MRN: [id number]   Date of Service  03/21/2023  HPI/Events of Note  Extubated to high flow nasal cannula.  Having copious secretions.  Intermittently on BiPAP.  eICU Interventions  NT suction as needed Glycopyrrolate Hold PO/Enteral meds - Colace, Aricept, Melatonin, Remeron, Oxycodone     Intervention Category Minor Interventions: Routine modifications to care plan (e.g. PRN medications for pain, fever)  Vandy Fong 03/21/2023, 10:26 PM

## 2023-03-21 NOTE — Progress Notes (Signed)
Pt placed on bipap per MD order.  PT is tolerating well.  HR 110, RR27 and shallow, sats 97% on 40$ FIO2.

## 2023-03-21 NOTE — Progress Notes (Signed)
NAME:  Karen [name], MRN:  [id number], DOB:  07/30/1950, LOS: 13 ADMISSION DATE:  03/08/2023, CONSULTATION DATE:  03/08/23  REFERRING MD: MD Halivand CHIEF COMPLAINT:  AMS, hypoxia   History of Present Illness:  Pt is a 71 yr old female with a significant pmh of HTN, Hypothyroidism, Major Depressive Disorder, cognitive impairment, arthritis, hearing loss, 30-40lb unintentional weight loss since 05/2022. Per patient's husband and daughter endorse that patient has had a functional and cognitive decline since the beginning of the 2024. Patient has had an extensive workup in regards to cognitive impairment and unintentional weight loss with MRI Brain, MRCPs, CT of chest and abdomen with no definitive etiology.   Patient presented to [facility] ED with AMS and hypoxia (O2 Sats in 70s-80s) by EMS from [facility], who was scheduled for a colonoscopy on 11/20. Patient did not receive colonoscopy due to hypoxia and AMS. Initial VBG revealed pH 7.17 and pCO2 > 123. Patient was given narcan during the initial ED workup with no response and subsequently placed on BIPAP due to increasing hypoxia and respiratory distress. Repeat VBG showed no improvement and patient was intubated due to hypoxia/hypercarbia. Also, CT of chest obtained by EDP showed evidence of scattered subsegmental PE and PCCM was consulted for further vent management as well as management for PE.   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   Significant Hospital Events: Including procedures, antibiotic start and stop dates in addition to other pertinent events   11/20 Admit with AMS, acute hypoxic/hypercarbic respiratory failure, subsegmental PE intubated PCCM consult 11/21 Intubated, sedated, tremors  11/22 Febrile, tachycardic, shivering/tremors seen with physical touch. IR LP obtained 11/23 Neuro consulted. MRI brain ordered 11/28 weaning but high respiratory rate, pressure support of 10 11/30 extubated.  Requiring BiPAP.  Transfused 1 unit packed red cells 12/2 mucus plug reintubated, bronch with therapeutic aspiration of RUL, LUL, lingula, bronchus intermedius 12/3 CXR improved  Interim History / Subjective:  Tolerated intubation and bronch well yesterday. CXR today improved  Objective   Blood pressure (!) 158/109, pulse 97, temperature 99.1 F (37.3 C), temperature source Bladder, resp. rate (!) 24, height 5\' 4"  (1.626 m), weight 61.6 kg, SpO2 96%.    Vent Mode: CPAP;PSV FiO2 (%):  [30 %-100 %] 30 % Set Rate:  [24 bmp] 24 bmp Vt Set:  [440 mL] 440 mL PEEP:  [5 cmH20-10 cmH20] 5 cmH20 Pressure Support:  [10 cmH20] 10 cmH20 Plateau Pressure:  [24 cmH20-25 cmH20] 25 cmH20   Intake/Output Summary (Last 24 hours) at 03/21/2023 0840 Last data filed at 03/21/2023 0802 Gross per 24 hour  Intake 2781.33 ml  Output 1150 ml  Net 1631.33 ml   Filed Weights   03/20/23 0439 03/21/23 0100 03/21/23 0400  Weight: 63.2 kg 61.6 kg 61.6 kg   Physical Exam: General: Adult female, resting in bed, in NAD. Neuro: Sedated, opens eyes to voice. HEENT: [place]/AT. Sclerae anicteric. ETT in place. Cardiovascular: RRR, no M/R/G.  Lungs: Respirations even and unlabored.  CTA  bilaterally, No W/R/R. Abdomen: BS x 4, soft, NT/ND.  Musculoskeletal: No  gross deformities, no edema.  Skin: Intact, warm, no rashes.  Assessment & Plan:   Acute hypoxemic respiratory failure- recurrent, initially seems to be driven by Pe's and encephalopathy.  Now with mucus plugging r/o HCAP s/p bronch with therapeutic aspiration of RUL, LUL, lingula, bronchus intermedius FTT, weight loss, cognitive disorder NOS- unrevealing workup to date, query developing dementia +/- chronic aspiration syndrome Hypotension - sedation related Hypokalemia, Hypophosphatemia, Hypomagnesemia HTN, HLD, Hypothyroidism, Depression, anxiety  - SBT today for probable extubation this AM - Continue Vanc/Zosyn for now though with low PCT, consider early d/c - F/u on BAL - Continue Heparin gtt currently and transition to DOAC once extubated and doing OK - Wean NE and sedation - Kphos and Mag repletion - Repeat BMP this PM - Prognosis guarded in light of the FTT and memory issues PTA - Family updated at bedside  Best Practice (right click and "Reselect all SmartList Selections" daily)   Diet/type: NPO, ADAT once extubated DVT prophylaxis: systemic heparin Pressure ulcer(s). None present GI prophylaxis: PPI Lines: N/A Foley:  Yes, and it is still needed Code Status:  full code Last date of multidisciplinary goals of care discussion: Updated family at bedside 12/2    CC time: 30 min.   Rutherford Guys, PA - [facility] For pager details, please see AMION or use Epic chat  After 1900, please call [facility] for cross coverage needs 03/21/2023, 8:54 AM

## 2023-03-21 NOTE — Progress Notes (Signed)
[facility] ADULT ICU REPLACEMENT PROTOCOL   The patient does apply for the [facility] Adult ICU Electrolyte Replacment Protocol based on the criteria listed below:   1.Exclusion criteria: TCTS, ECMO, Dialysis, and Myasthenia Gravis patients 2. Is GFR >/= 30 ml/min? Yes.    Patient's GFR today is >60 3. Is SCr </= 2? Yes.   Patient's SCr is 0.63 mg/dL 4. Did SCr increase >/= 0.5 in 24 hours? No. 5.Pt's weight >40kg  Yes.   6. Abnormal electrolyte(s): POtassium, PHosphorus, Magnesium  7. Electrolytes replaced per protocol 8.  Call MD STAT for K+ </= 2.5, Phos </= 1, or Mag </= 1 Physician:  Dr. Faye Ramsay A Lyanna Blystone 03/21/2023 6:24 AM

## 2023-03-21 NOTE — Plan of Care (Signed)
?  Problem: Clinical Measurements: ?Goal: Respiratory complications will improve ?Outcome: Not Progressing ?  ?Problem: Activity: ?Goal: Risk for activity intolerance will decrease ?Outcome: Not Progressing ?  ?Problem: Nutrition: ?Goal: Adequate nutrition will be maintained ?Outcome: Not Progressing ?  ?

## 2023-03-21 NOTE — Progress Notes (Signed)
Pt extubated by MD.  Pt placed on HHFNC 60L/100%.  HR 125, RR 10.  RN and MD at bedside.

## 2023-03-21 NOTE — TOC Progression Note (Signed)
Transition of Care [facility]) - Progression Note    Patient Details  Name: Karen [name] MRN: [id number] Date of Birth: 1951-04-15  Transition of Care [facility]) CM/SW Contact  Darleene Cleaver, [place] Phone Number: 03/21/2023, 6:16 PM  Clinical Narrative:     TOC continuing to follow patient's progress throughout discharge planning.  Disposition unknown at this time.   Expected Discharge Plan: Home/Self Care Barriers to Discharge: Continued Medical Work up  Expected Discharge Plan and Services   Discharge Planning Services: CM Consult   Living arrangements for the past 2 months: Single Family Home                                       Social Determinants of Health (SDOH) Interventions SDOH Screenings   Food Insecurity: No Food Insecurity (03/09/2023)  Housing: Patient Unable To Answer (03/09/2023)  Transportation Needs: No Transportation Needs (03/09/2023)  Utilities: Not At Risk (03/09/2023)  Depression (PHQ2-9): Low Risk  (03/23/2022)  Tobacco Use: Medium Risk (03/18/2023)    Readmission Risk Interventions    03/09/2023    1:14 PM  Readmission Risk Prevention Plan  Transportation Screening Complete  PCP or Specialist Appt within 5-7 Days Complete  Home Care Screening Complete  Medication Review (RN CM) Complete

## 2023-03-21 NOTE — Progress Notes (Signed)
PHARMACY - ANTICOAGULATION CONSULT NOTE  Pharmacy Consult for heparin Indication: acute pulmonary embolus  Allergies  Allergen Reactions   Zoloft [Sertraline Hcl] Anxiety and Other (See Comments)    "crazy feeling"    Patient Measurements: Height: 5\' 4"  (162.6 cm) Weight: 61.6 kg (135 lb 12.9 oz) IBW/kg (Calculated) : 54.7 Heparin Dosing Weight: TBW  Vital Signs: Temp: 98.7 F (37.1 C) (12/03 0400) Temp Source: Bladder (12/03 0400) BP: 159/76 (12/03 0645) Pulse Rate: 91 (12/03 0645)  Labs: Recent Labs    03/19/23 0037 03/19/23 0506 03/20/23 0549 03/21/23 0320  HGB 9.5*  --  8.6* 8.1*  HCT 31.8*  --  29.1* 26.0*  PLT 495*  --  598* 591*  HEPARINUNFRC 0.35 0.40 0.46 0.43  CREATININE  --   --   --  0.63    Estimated Creatinine Clearance: 56.5 mL/min (by C-G formula based on SCr of 0.63 mg/dL).   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 Three variants of uncertain significance were detected - one in the AXIN2 gene called c.1829G>A, one in the PMS2 gene called c.964G>A, and one in the RNF43 gene called c.1114C>T. The report date is 06/24/2019.     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 She was transferred to the [facility] ED where CTA chest revealed scattered subsegmental pulmonary emboli throughout the right lung, no RHS. She was intubated in the ED due to lack of improvement on BiPAP. No anticoagulation noted PTA. Pharmacy consulted for heparin dosing.   Significant events: -11/21: LE Doppler: Findings consistent with age indeterminate DVT involving the left peroneal veins.  -11/22: lumbar puncture by IR  -11/30: extubated - 12/2 reintubated, bronch  Today, 03/21/2023: - Heparin level 0.43, therapeutic on 1200 units/hr - CBC: Hgb continues to decrease to 8.1 (last transfused on 11/30, decreasing since then), Plt remain elevated - No complications of therapy noted, No bleeding reported by RN   Goal of Therapy:  Heparin level 0.3-0.7 units/ml Monitor platelets by anticoagulation protocol: Yes   Plan:  -Continue heparin infusion at 1200 units/hr -Daily CBC, heparin level -Monitor closely for s/sx of bleeding   Lynann Beaver PharmD, BCPS [facility] main pharmacy [phone] 03/21/2023 7:28 AM

## 2023-03-21 NOTE — Progress Notes (Signed)
Pt hyper-oxygenated on 100% fio2 prior to NT suction.  Pt NT suctioned x2 down left nare for copious amounts of thick white secretions.  Pt tolerated well without incident.  RN assisted with procedure.

## 2023-03-22 ENCOUNTER — Inpatient Hospital Stay (HOSPITAL_COMMUNITY): Payer: Medicare HMO

## 2023-03-22 DIAGNOSIS — J9601 Acute respiratory failure with hypoxia: Secondary | ICD-10-CM | POA: Diagnosis not present

## 2023-03-22 DIAGNOSIS — I2699 Other pulmonary embolism without acute cor pulmonale: Secondary | ICD-10-CM | POA: Diagnosis not present

## 2023-03-22 DIAGNOSIS — R404 Transient alteration of awareness: Principal | ICD-10-CM

## 2023-03-22 LAB — BLOOD GAS, ARTERIAL
Acid-Base Excess: 12.9 mmol/L — ABNORMAL HIGH (ref 0.0–2.0)
Bicarbonate: 35.3 mmol/L — ABNORMAL HIGH (ref 20.0–28.0)
Drawn by: 66453
FIO2: 40 %
MECHVT: 440 mL
O2 Saturation: 99.6 %
PEEP: 10 cmH2O
Patient temperature: 38.3
RATE: 24 {breaths}/min
pCO2 arterial: 38 mm[Hg] (ref 32–48)
pH, Arterial: 7.58 — ABNORMAL HIGH (ref 7.35–7.45)
pO2, Arterial: 89 mm[Hg] (ref 83–108)

## 2023-03-22 LAB — PHOSPHORUS: Phosphorus: 4.1 mg/dL (ref 2.5–4.6)

## 2023-03-22 LAB — CBC
HCT: 24.6 % — ABNORMAL LOW (ref 36.0–46.0)
Hemoglobin: 7.2 g/dL — ABNORMAL LOW (ref 12.0–15.0)
MCH: 29.5 pg (ref 26.0–34.0)
MCHC: 29.3 g/dL — ABNORMAL LOW (ref 30.0–36.0)
MCV: 100.8 fL — ABNORMAL HIGH (ref 80.0–100.0)
Platelets: 515 10*3/uL — ABNORMAL HIGH (ref 150–400)
RBC: 2.44 MIL/uL — ABNORMAL LOW (ref 3.87–5.11)
RDW: 14.4 % (ref 11.5–15.5)
WBC: 10.6 10*3/uL — ABNORMAL HIGH (ref 4.0–10.5)
nRBC: 0 % (ref 0.0–0.2)

## 2023-03-22 LAB — BASIC METABOLIC PANEL
Anion gap: 7 (ref 5–15)
BUN: 21 mg/dL (ref 8–23)
CO2: 32 mmol/L (ref 22–32)
Calcium: 9.1 mg/dL (ref 8.9–10.3)
Chloride: 104 mmol/L (ref 98–111)
Creatinine, Ser: 0.56 mg/dL (ref 0.44–1.00)
GFR, Estimated: >60 mL/min (ref >60)
Glucose, Bld: 86 mg/dL (ref 70–99)
Potassium: 3.8 mmol/L (ref 3.5–5.1)
Sodium: 143 mmol/L (ref 135–145)

## 2023-03-22 LAB — GLUCOSE, CAPILLARY
Glucose-Capillary: 113 mg/dL — ABNORMAL HIGH (ref 70–99)
Glucose-Capillary: 69 mg/dL — ABNORMAL LOW (ref 70–99)
Glucose-Capillary: 74 mg/dL (ref 70–99)
Glucose-Capillary: 75 mg/dL (ref 70–99)
Glucose-Capillary: 79 mg/dL (ref 70–99)
Glucose-Capillary: 81 mg/dL (ref 70–99)
Glucose-Capillary: 83 mg/dL (ref 70–99)

## 2023-03-22 LAB — HEPARIN LEVEL (UNFRACTIONATED): Heparin Unfractionated: 0.56 [IU]/mL (ref 0.30–0.70)

## 2023-03-22 LAB — TRIGLYCERIDES: Triglycerides: 251 mg/dL — ABNORMAL HIGH (ref <150)

## 2023-03-22 LAB — MAGNESIUM: Magnesium: 2.2 mg/dL (ref 1.7–2.4)

## 2023-03-22 MED ORDER — DEXTROSE 50 % IV SOLN
12.5000 g | Freq: Once | INTRAVENOUS | Status: AC
  Administered 2023-03-22: 12.5 g via INTRAVENOUS
  Filled 2023-03-22: qty 50

## 2023-03-22 MED ORDER — PROPOFOL 1000 MG/100ML IV EMUL
0.0000 ug/kg/min | INTRAVENOUS | Status: DC
  Administered 2023-03-22: 50 ug/kg/min via INTRAVENOUS
  Administered 2023-03-22: 5 ug/kg/min via INTRAVENOUS
  Administered 2023-03-23: 20 ug/kg/min via INTRAVENOUS
  Filled 2023-03-22 (×4): qty 100

## 2023-03-22 MED ORDER — PHENYLEPHRINE 80 MCG/ML (10ML) SYRINGE FOR IV PUSH (FOR BLOOD PRESSURE SUPPORT)
PREFILLED_SYRINGE | INTRAVENOUS | Status: AC
  Filled 2023-03-22: qty 10

## 2023-03-22 MED ORDER — ROCURONIUM BROMIDE 10 MG/ML (PF) SYRINGE: 70.0000 mg | PREFILLED_SYRINGE | Freq: Once | INTRAVENOUS | Status: AC

## 2023-03-22 MED ORDER — MIDAZOLAM HCL 2 MG/2ML IJ SOLN: 2.0000 mg | Freq: Once | INTRAMUSCULAR | Status: AC

## 2023-03-22 MED ORDER — FENTANYL CITRATE (PF) 100 MCG/2ML IJ SOLN: 100.0000 ug | Freq: Once | INTRAMUSCULAR | Status: AC

## 2023-03-22 MED ORDER — FENTANYL CITRATE (PF) 100 MCG/2ML IJ SOLN
INTRAMUSCULAR | Status: AC
  Administered 2023-03-22: 100 ug via INTRAVENOUS
  Filled 2023-03-22: qty 2

## 2023-03-22 MED ORDER — MIRTAZAPINE 15 MG PO TABS: 15.0000 mg | ORAL_TABLET | Freq: Every day | ORAL | Status: DC

## 2023-03-22 MED ORDER — FENTANYL 2500MCG IN NS 250ML (10MCG/ML) PREMIX INFUSION
0.0000 ug/h | INTRAVENOUS | Status: DC
  Administered 2023-03-22: 175 ug/h via INTRAVENOUS
  Administered 2023-03-22: 25 ug/h via INTRAVENOUS
  Administered 2023-03-23: 50 ug/h via INTRAVENOUS
  Administered 2023-03-25: 100 ug/h via INTRAVENOUS
  Administered 2023-03-25 – 2023-03-26 (×2): 200 ug/h via INTRAVENOUS
  Administered 2023-03-26: 150 ug/h via INTRAVENOUS
  Administered 2023-03-27: 125 ug/h via INTRAVENOUS
  Administered 2023-03-28 – 2023-03-30 (×3): 100 ug/h via INTRAVENOUS
  Filled 2023-03-22 (×12): qty 250

## 2023-03-22 MED ORDER — POLYETHYLENE GLYCOL 3350 17 G PO PACK
17.0000 g | PACK | Freq: Every day | ORAL | Status: DC
  Administered 2023-03-30 – 2023-04-02 (×3): 17 g
  Filled 2023-03-22 (×5): qty 1

## 2023-03-22 MED ORDER — ETOMIDATE 2 MG/ML IV SOLN
INTRAVENOUS | Status: AC
  Administered 2023-03-22: 20 mg via INTRAVENOUS
  Filled 2023-03-22: qty 20

## 2023-03-22 MED ORDER — ROCURONIUM BROMIDE 10 MG/ML (PF) SYRINGE
PREFILLED_SYRINGE | INTRAVENOUS | Status: AC
  Administered 2023-03-22: 70 mg via INTRAVENOUS
  Filled 2023-03-22: qty 10

## 2023-03-22 MED ORDER — POTASSIUM CHLORIDE 10 MEQ/100ML IV SOLN
10.0000 meq | Freq: Once | INTRAVENOUS | Status: AC
  Administered 2023-03-22: 10 meq via INTRAVENOUS
  Filled 2023-03-22: qty 100

## 2023-03-22 MED ORDER — NOREPINEPHRINE 4 MG/250ML-% IV SOLN
0.0000 ug/min | INTRAVENOUS | Status: DC
Start: 2023-03-22 — End: 2023-03-26
  Administered 2023-03-24: 2 ug/min via INTRAVENOUS
  Filled 2023-03-22: qty 250

## 2023-03-22 MED ORDER — FUROSEMIDE 10 MG/ML IJ SOLN
40.0000 mg | Freq: Two times a day (BID) | INTRAMUSCULAR | Status: AC
  Administered 2023-03-22 (×2): 40 mg via INTRAVENOUS
  Filled 2023-03-22 (×2): qty 4

## 2023-03-22 MED ORDER — ETOMIDATE 2 MG/ML IV SOLN: 20.0000 mg | Freq: Once | INTRAVENOUS | Status: AC

## 2023-03-22 MED ORDER — POTASSIUM CHLORIDE 10 MEQ/100ML IV SOLN
10.0000 meq | INTRAVENOUS | Status: AC
  Administered 2023-03-22 (×3): 10 meq via INTRAVENOUS
  Filled 2023-03-22 (×3): qty 100

## 2023-03-22 MED ORDER — MIDAZOLAM HCL 2 MG/2ML IJ SOLN
INTRAMUSCULAR | Status: AC
  Administered 2023-03-22: 2 mg via INTRAVENOUS
  Filled 2023-03-22: qty 2

## 2023-03-22 MED ORDER — DOCUSATE SODIUM 50 MG/5ML PO LIQD: 100.0000 mg | Freq: Two times a day (BID) | ORAL | Status: DC

## 2023-03-22 MED ORDER — FENTANYL BOLUS VIA INFUSION
25.0000 ug | INTRAVENOUS | Status: DC | PRN
  Administered 2023-03-22 (×4): 25 ug via INTRAVENOUS
  Administered 2023-03-22 – 2023-03-23 (×3): 50 ug via INTRAVENOUS
  Administered 2023-03-23: 25 ug via INTRAVENOUS
  Administered 2023-03-23 – 2023-03-24 (×3): 50 ug via INTRAVENOUS
  Administered 2023-03-24: 75 ug via INTRAVENOUS
  Administered 2023-03-24: 50 ug via INTRAVENOUS
  Administered 2023-03-24: 75 ug via INTRAVENOUS
  Administered 2023-03-24: 50 ug via INTRAVENOUS
  Administered 2023-03-24 (×2): 75 ug via INTRAVENOUS
  Administered 2023-03-24: 50 ug via INTRAVENOUS
  Administered 2023-03-25 – 2023-03-28 (×12): 100 ug via INTRAVENOUS
  Administered 2023-03-28: 50 ug via INTRAVENOUS
  Administered 2023-03-28 (×2): 100 ug via INTRAVENOUS
  Administered 2023-03-28 (×2): 50 ug via INTRAVENOUS
  Administered 2023-03-28 (×2): 100 ug via INTRAVENOUS
  Administered 2023-03-28: 50 ug via INTRAVENOUS
  Administered 2023-03-29 – 2023-03-30 (×4): 100 ug via INTRAVENOUS

## 2023-03-22 MED ORDER — ALBUMIN HUMAN 25 % IV SOLN
12.5000 g | Freq: Once | INTRAVENOUS | Status: AC
  Administered 2023-03-22: 12.5 g via INTRAVENOUS
  Filled 2023-03-22: qty 50

## 2023-03-22 MED ORDER — GLYCOPYRROLATE 0.2 MG/ML IJ SOLN
0.1000 mg | Freq: Three times a day (TID) | INTRAMUSCULAR | Status: AC
  Administered 2023-03-22 (×2): 0.1 mg via INTRAVENOUS
  Filled 2023-03-22 (×2): qty 1

## 2023-03-22 MED ORDER — FENTANYL CITRATE PF 50 MCG/ML IJ SOSY: 25.0000 ug | PREFILLED_SYRINGE | Freq: Once | INTRAMUSCULAR | Status: DC

## 2023-03-22 NOTE — Progress Notes (Addendum)
NAME:  Karen [name], MRN:  [id number], DOB:  November 27, 1950, LOS: 14 ADMISSION DATE:  03/08/2023, CONSULTATION DATE:  03/08/23  REFERRING MD: MD Halivand CHIEF COMPLAINT:  AMS, hypoxia   History of Present Illness:  Pt is a 72 yr old female with a significant pmh of HTN, Hypothyroidism, Major Depressive Disorder, cognitive impairment, arthritis, hearing loss, 30-40lb unintentional weight loss since 05/2022. Per patient's husband and daughter endorse that patient has had a functional and cognitive decline since the beginning of the 2024. Patient has had an extensive workup in regards to cognitive impairment and unintentional weight loss with MRI Brain, MRCPs, CT of chest and abdomen with no definitive etiology.   Patient presented to [facility] ED with AMS and hypoxia (O2 Sats in 70s-80s) by EMS from [facility], who was scheduled for a colonoscopy on 11/20. Patient did not receive colonoscopy due to hypoxia and AMS. Initial VBG revealed pH 7.17 and pCO2 > 123. Patient was given narcan during the initial ED workup with no response and subsequently placed on BIPAP due to increasing hypoxia and respiratory distress. Repeat VBG showed no improvement and patient was intubated due to hypoxia/hypercarbia. Also, CT of chest obtained by EDP showed evidence of scattered subsegmental PE and PCCM was consulted for further vent management as well as management for PE.   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   Significant Hospital Events: Including procedures, antibiotic start and stop dates in addition to other pertinent events   11/20 Admit with AMS, acute hypoxic/hypercarbic respiratory failure, subsegmental PE intubated PCCM consult 11/21 Intubated, sedated, tremors  11/22 Febrile, tachycardic, shivering/tremors seen with physical touch. IR LP obtained 11/23 Neuro consulted. MRI brain ordered 11/28 weaning but high respiratory rate, pressure support of 10 11/30 extubated.  Requiring BiPAP.  Transfused 1 unit packed red cells 12/2 mucus plug reintubated, bronch with therapeutic aspiration of RUL, LUL, lingula, bronchus intermedius 12/3 CXR improved, extubated 12/4 reintubation after further discussions with family  Interim History / Subjective:  Lots of oral secretions overnight requiring oral and NTS suctioning along with Robinul then much improved. This AM, remains on BiPAP but looks a bit more awake. Has been on BiPAP since yesterday mid morning.  After further discussions with family, we will go ahead with reintubation and bronch then get Cspine MRI to r/o potential structural causes for diaphragmatic weakness  Objective   Blood pressure 132/67, pulse 96, temperature 100.1 F (37.8 C), temperature source Core, resp. rate (!) 25, height 5\' 4"  (1.626 m), weight 61.6 kg, SpO2 96%.    Vent Mode: BIPAP FiO2 (%):  [30 %-100 %] 35 % Set Rate:  [18 bmp-28 bmp] 28 bmp Vt Set:  [440 mL] 440 mL PEEP:  [5 cmH20] 5 cmH20 Pressure Support:  [10 cmH20] 10 cmH20 Plateau Pressure:  [25 cmH20] 25 cmH20   Intake/Output Summary (Last 24 hours) at 03/22/2023 0749 Last data filed at 03/22/2023 0522 Gross per 24 hour  Intake 1603.11 ml  Output 975 ml  Net 628.11  ml   Filed Weights   03/21/23 0100 03/21/23  0400 03/22/23 0500  Weight: 61.6 kg 61.6 kg 61.6 kg   Physical Exam: General: Adult female, resting in bed, in NAD. Neuro: Awake but doesn't follow commands, nods head intermittently. HEENT: [place]/AT. Sclerae anicteric. BiPAP in place. Cardiovascular: RRR, no M/R/G.  Lungs: Respirations even and unlabored.  CTA bilaterally, No W/R/R. Abdomen: BS x 4, soft, NT/ND.  Musculoskeletal: No gross deformities, no edema.  Skin: Intact, warm, no rashes.  Assessment & Plan:   Acute hypoxemic respiratory failure- recurrent, initially seems to be driven by Pe's and encephalopathy.  Now with mucus plugging r/o HCAP s/p bronch 12/2 with therapeutic aspiration of RUL, LUL, lingula, bronchus intermedius Pulmonary Embolism with LLE DVT - echo 11/21 reassuring FTT, weight loss, cognitive disorder NOS- unrevealing workup to date, query developing dementia +/- chronic aspiration syndrome Volume Overload - +12.7L net since admit HTN, HLD, Hypothyroidism, Depression, anxiety Nutrition  - After further discussions with family, we will go ahead with reintubation this AM. - Go ahead with therapeutic bronch after intubation. - Get MRI Cspine to r/o structural causes for diaphragmatic weaknesses. - Depending on family discussions, will need trach if family opting for aggressive measures. - Continue Vanc/Zosyn for now but if PCT remains low/neg today then would d/c (would have had 3 days) - F/u BAL through completion - Lower Robinul dose to try and prevent overdrying her given hx of mucus plugging (however, will keep it on [facility] for now as daughter says it really helped her overnight) - Continue Heparin gtt currently and transition to DOAC once extubated and doing OK and we are certain no imminent intubation and possible trach - Lasix 40mg  x 2 doses - Albumin x 1 - D/c oxycodone, Remeron - Repeat BMP this PM - Start TF's after intubation - Prognosis guarded in light of the FTT and memory issues PTA - Family  updated at bedside. They will discuss long term plan regarding where we go from here if doesn't wean well from vent etc (unlikely given past failures this admission)  Best Practice (right click and "Reselect all SmartList Selections" daily)   Diet/type: NPO. TF's after intubation DVT prophylaxis: systemic heparin for now Pressure ulcer(s). None present GI prophylaxis: PPI Lines: N/A Foley:  Yes, and it is still needed Code Status:  full code Last date of multidisciplinary goals of care discussion: Updated family at bedside 12/4   CC time: 30 min.   Rutherford Guys, PA - [facility] For pager details, please see AMION or use Epic chat  After 1900, please call [facility] for cross coverage needs 03/22/2023, 7:49 AM

## 2023-03-22 NOTE — Procedures (Signed)
Bronchoscopy Procedure Note  Karen [name]  [id number]  1950-12-30  Date:03/22/23  Time:11:01 AM   Provider Performing:Shuntavia Yerby C Katrinka Blazing   Procedure(s):  Flexible bronchoscopy with bronchial alveolar lavage [phone])  Indication(s) Pt needing reintubation, unable to tolerate BIPAP, and copious amounts of secretions-not able to maintain airway  Consent Risks of the procedure as well as the alternatives and risks of each were explained to the patient and/or caregiver.  Consent for the procedure was obtained and is signed in the bedside chart  Anesthesia Fentanyl 100 mcg and Versed 2mg  IV push    Time Out Verified patient identification, verified procedure, site/side was marked, verified correct patient position, special equipment/implants available, medications/allergies/relevant history reviewed, required imaging and test results available.   Sterile Technique Usual hand hygiene, masks, gowns, and gloves were used   Procedure Description Bronchoscope advanced through endotracheal tube and into airway.  Airways were examined down to subsegmental levels.   Findings: Copious Tenacious Secretions/Mucous plugs in right and left bronchial tree with multiple passes of scope to remove secretions/mucous plugs    Complications/Tolerance None; patient tolerated the procedure well. Chest X-ray pending   EBL Minimal   Specimen(s) None

## 2023-03-22 NOTE — Progress Notes (Signed)
Pt taken off bipap at 0815 per pt request and placed on hhfnc, pt tolerating well at this time

## 2023-03-22 NOTE — Progress Notes (Signed)
Nutrition Follow-up  DOCUMENTATION CODES:   Non-severe (moderate) malnutrition in context of chronic illness  INTERVENTION:  - Once new OGT able to be placed and xray verified, would recommend restarting below tube feed regimen once medically appropriate: Vital 1.2 at 55 ml/h  *Would start at 53mL/hr and advance by 10mL Q4H to goal of 39mL/hr Provides 1584 kcal, 99 gm protein, 1070 ml free water daily    - FWF per CCM/MD   - Monitor weight trends.   NUTRITION DIAGNOSIS:   Moderate Malnutrition related to chronic illness as evidenced by mild fat depletion, moderate muscle depletion, percent weight loss (23% in 9 months). *ongoing  GOAL:   Patient will meet greater than or equal to 90% of their needs *met with TF  MONITOR:   Vent status, Labs, Weight trends, TF tolerance  REASON FOR ASSESSMENT:   Consult Enteral/tube feeding initiation and management  ASSESSMENT:   72 yr old female with PMH significant of HTN, Hypothyroidism, MDD, cognitive impairment, arthritis who presented with AMS and hypoxia from [facility] after being scheduled for a colonoscopy.  11/20 Admit; Intubated  11/21 Vit 1.2 started at 41mL/hr 11/23 Reached goal of 53mL/hr 11/30 Extubated, OGT removed; Tube feeds stopped 12/2 Re-intubated, OGT placed 12/3 Extubated, OGT removed 12/4 Re-intubated  Patient re-intubated this AM after discussions with family. May need trach/PEG.   Per MAR, tube feed order was discontinued 11/30 so patient has not received tube feeds since that time.   Per CCM note, can restart tube feeds after intubation (and new tube placed).   Abdominal xray obtained this AM but no tube seen on xray. Tube removed and no new tube yet placed per EMR.   Admit weight: 122# Current weight: 135# I&O's: +8L + moderate pitting generalized edema  Medications reviewed and include: Colace, Miralax, 100mg  vitamin B6, 100mg  thiamine Fentanyl Propofol @ 5.17mL/hr (provides  146 kcals over 24 hours)  Labs reviewed:  -   Diet Order:   Diet Order             Diet NPO time specified  Diet effective now                   EDUCATION NEEDS:  Education needs have been addressed  Skin:  Skin Assessment: Reviewed RN Assessment  Last BM:  11/30  Height:  Ht Readings from Last 1 Encounters:  03/09/23 5\' 4"  (1.626 m)   Weight:  Wt Readings from Last 1 Encounters:  03/22/23 61.6 kg   BMI:  Body mass index is 23.31 kg/m.  Estimated Nutritional Needs:  Kcal:  1550-1750 Protein:  85-100g Fluid:  1.7L/day    Shelle Iron RD, LDN For contact information, refer to [facility].

## 2023-03-22 NOTE — Procedures (Signed)
03/21/23  Patient extubated by me around 1247 Very poor cough, copious oral secretions BIPAP on standby  Myrla Halsted MD PCCM

## 2023-03-22 NOTE — Progress Notes (Signed)
eLink Physician-Brief Progress Note Patient Name: Brenlyn Baxendale DOB: 09-10-1950 MRN: [id number]   Date of Service  03/22/2023  HPI/Events of Note  OG tube in place  eICU Interventions  KUB reviewed, okay to use   0031 - Becoming more hypotensive with diuresis, working down sedation, persistent fevers -> ice packs, Use norepi as ordered if hypotensive.  0507 -had a hypoglycemic episode, repeat CBG at 70.  Currently not on tube feeds.  Will add low-dose dextrose infusion.  Intervention Category Minor Interventions: Routine modifications to care plan (e.g. PRN medications for pain, fever)  Jaycen Vercher 03/22/2023, 10:10 PM

## 2023-03-22 NOTE — TOC Progression Note (Signed)
Transition of Care [facility]) - Progression Note    Patient Details  Name: Karen [name] MRN: [id number] Date of Birth: 07/17/1950  Transition of Care [facility]) CM/SW Contact  Geni Bers, RN Phone Number: 03/22/2023, 11:05 AM  Clinical Narrative:     Unable reach pt's husband. Spoke with daughter [name] to inform her of LTAC bed offers to [facility] and [facility]. MD will meet with family in the am.   Expected Discharge Plan: Home/Self Care Barriers to Discharge: Continued Medical Work up  Expected Discharge Plan and Services   Discharge Planning Services: CM Consult   Living arrangements for the past 2 months: Single Family Home                                       Social Determinants of Health (SDOH) Interventions SDOH Screenings   Food Insecurity: No Food Insecurity (03/09/2023)  Housing: Patient Unable To Answer (03/09/2023)  Transportation Needs: No Transportation Needs (03/09/2023)  Utilities: Not At Risk (03/09/2023)  Depression (PHQ2-9): Low Risk  (03/23/2022)  Tobacco Use: Medium Risk (03/18/2023)    Readmission Risk Interventions    03/09/2023    1:14 PM  Readmission Risk Prevention Plan  Transportation Screening Complete  PCP or Specialist Appt within 5-7 Days Complete  Home Care Screening Complete  Medication Review (RN CM) Complete

## 2023-03-22 NOTE — Procedures (Signed)
Intubation Procedure Note  Karen [name]  [id number]  [birth year]  Date:03/22/23  Time:10:58 AM   Provider Performing:Docie Abramovich C Katrinka Blazing    Procedure: Intubation (31500)  Indication(s) Respiratory Failure  Consent Risks of the procedure as well as the alternatives and risks of each were explained to the patient and/or caregiver.  Consent for the procedure was obtained and is signed in the bedside chart   Anesthesia Etomidate, Versed, Fentanyl, and Rocuronium   Time Out Verified patient identification, verified procedure, site/side was marked, verified correct patient position, special equipment/implants available, medications/allergies/relevant history reviewed, required imaging and test results available.   Sterile Technique Usual hand hygeine, masks, and gloves were used   Procedure Description Patient positioned in bed supine.  Sedation given as noted above.  Patient was intubated with endotracheal tube using Glidescope.  View was Grade 1 full glottis .  Number of attempts was 1.  Colorimetric CO2 detector was consistent with tracheal placement.   Complications/Tolerance None; patient tolerated the procedure well. Chest X-ray is ordered to verify placement.   EBL None    Specimen(s) None

## 2023-03-22 NOTE — Progress Notes (Signed)
PHARMACY - ANTICOAGULATION CONSULT NOTE  Pharmacy Consult for heparin Indication: acute pulmonary embolus  Allergies  Allergen Reactions   Zoloft [Sertraline Hcl] Anxiety and Other (See Comments)    "crazy feeling"    Patient Measurements: Height: 5\' 4"  (162.6 cm) Weight: 61.6 kg (135 lb 12.9 oz) IBW/kg (Calculated) : 54.7 Heparin Dosing Weight: TBW  Vital Signs: Temp: 100.5 F (38.1 C) (12/04 0800) Temp Source: Bladder (12/04 0800) BP: 132/67 (12/04 0700) Pulse Rate: 96 (12/04 0700)  Labs: Recent Labs    03/20/23 0549 03/21/23 0320 03/21/23 1750 03/22/23 0509  HGB 8.6* 8.1*  --  7.2*  HCT 29.1* 26.0*  --  24.6*  PLT 598* 591*  --  515*  HEPARINUNFRC 0.46 0.43  --  0.56  CREATININE  --  0.63 0.72 0.56    Estimated Creatinine Clearance: 56.5 mL/min (by C-G formula based on SCr of 0.56 mg/dL).   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 Three variants of uncertain significance were detected - one in the AXIN2 gene called c.1829G>A, one in the PMS2 gene called c.964G>A, and one in the RNF43 gene called c.1114C>T. The report date is 06/24/2019.     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 She was transferred [facility] ED where CTA chest revealed scattered subsegmental pulmonary emboli throughout the right lung, no RHS. She was intubated in the ED due to lack of improvement on BiPAP. No anticoagulation noted PTA. Pharmacy consulted for heparin dosing.   Significant events: -11/21: LE Doppler: Findings consistent with age indeterminate DVT involving the left peroneal veins.  -11/22: lumbar puncture by IR  -11/30: extubated - 12/2 reintubated, bronch  Today, 03/22/2023: - Heparin level remains therapeutic on 1200 units/hr - CBC: Hgb continues to decrease to 7.2  (last transfused on 11/30, decreasing since then), Plt remain elevated - No complications of therapy noted, No bleeding reported by RN   Goal of Therapy:  Heparin level 0.3-0.7 units/ml Monitor platelets by anticoagulation protocol: Yes   Plan:  -Continue heparin infusion at 1200 units/hr -Daily CBC, heparin level -Monitor closely for s/sx of bleeding   Hessie Knows, PharmD, BCPS Secure Chat if ?s 03/22/2023 9:21 AM

## 2023-03-22 NOTE — Progress Notes (Signed)
Pt transported to MRI with no complications on vent

## 2023-03-22 NOTE — Progress Notes (Signed)
PT Cancellation Note  Patient Details Name: Karen [name] MRN: [id number] DOB: Jan 15, 1951   Cancelled Treatment:    Reason Eval/Treat Not Completed: Medical issues which prohibited therapy  Patient required reintubation.   Blanchard Kelch PT Acute Rehabilitation Services Office [phone] Weekend pager-[phone]  Rada Hay 03/22/2023, 11:28 AM

## 2023-03-22 NOTE — TOC Progression Note (Signed)
Transition of Care [facility]) - Progression Note    Patient Details  Name: Karen [name] MRN: [id number] Date of Birth: [birth year]  Transition of Care [facility]) CM/SW Contact  Geni Bers, RN Phone Number: 03/22/2023, 9:21 AM  Clinical Narrative:    Referral given to LTAC per MD request.    Expected Discharge Plan: Home/Self Care Barriers to Discharge: Continued Medical Work up  Expected Discharge Plan and Services   Discharge Planning Services: CM Consult   Living arrangements for the past 2 months: Single Family Home                                       Social Determinants of Health (SDOH) Interventions SDOH Screenings   Food Insecurity: No Food Insecurity (03/09/2023)  Housing: Patient Unable To Answer (03/09/2023)  Transportation Needs: No Transportation Needs (03/09/2023)  Utilities: Not At Risk (03/09/2023)  Depression (PHQ2-9): Low Risk  (03/23/2022)  Tobacco Use: Medium Risk (03/18/2023)    Readmission Risk Interventions    03/09/2023    1:14 PM  Readmission Risk Prevention Plan  Transportation Screening Complete  PCP or Specialist Appt within 5-7 Days Complete  Home Care Screening Complete  Medication Review (RN CM) Complete

## 2023-03-23 DIAGNOSIS — R627 Adult failure to thrive: Secondary | ICD-10-CM | POA: Diagnosis not present

## 2023-03-23 DIAGNOSIS — J9601 Acute respiratory failure with hypoxia: Secondary | ICD-10-CM | POA: Diagnosis not present

## 2023-03-23 DIAGNOSIS — I2699 Other pulmonary embolism without acute cor pulmonale: Secondary | ICD-10-CM | POA: Diagnosis not present

## 2023-03-23 DIAGNOSIS — J9602 Acute respiratory failure with hypercapnia: Secondary | ICD-10-CM | POA: Diagnosis not present

## 2023-03-23 DIAGNOSIS — E162 Hypoglycemia, unspecified: Secondary | ICD-10-CM

## 2023-03-23 LAB — BASIC METABOLIC PANEL
Anion gap: 10 (ref 5–15)
BUN: 17 mg/dL (ref 8–23)
CO2: 32 mmol/L (ref 22–32)
Calcium: 9.2 mg/dL (ref 8.9–10.3)
Chloride: 100 mmol/L (ref 98–111)
Creatinine, Ser: 0.56 mg/dL (ref 0.44–1.00)
GFR, Estimated: >60 mL/min (ref >60)
Glucose, Bld: 87 mg/dL (ref 70–99)
Potassium: 3.8 mmol/L (ref 3.5–5.1)
Sodium: 142 mmol/L (ref 135–145)

## 2023-03-23 LAB — GLUCOSE, CAPILLARY
Glucose-Capillary: 70 mg/dL (ref 70–99)
Glucose-Capillary: 65 mg/dL — ABNORMAL LOW (ref 70–99)
Glucose-Capillary: 108 mg/dL — ABNORMAL HIGH (ref 70–99)
Glucose-Capillary: 108 mg/dL — ABNORMAL HIGH (ref 70–99)
Glucose-Capillary: 135 mg/dL — ABNORMAL HIGH (ref 70–99)
Glucose-Capillary: 120 mg/dL — ABNORMAL HIGH (ref 70–99)
Glucose-Capillary: 148 mg/dL — ABNORMAL HIGH (ref 70–99)

## 2023-03-23 LAB — CULTURE, RESPIRATORY W GRAM STAIN

## 2023-03-23 LAB — PROCALCITONIN: Procalcitonin: 0.32 ng/mL

## 2023-03-23 LAB — CBC
HCT: 26.6 % — ABNORMAL LOW (ref 36.0–46.0)
Hemoglobin: 8 g/dL — ABNORMAL LOW (ref 12.0–15.0)
MCH: 30.2 pg (ref 26.0–34.0)
MCHC: 30.1 g/dL (ref 30.0–36.0)
MCV: 100.4 fL — ABNORMAL HIGH (ref 80.0–100.0)
Platelets: 566 10*3/uL — ABNORMAL HIGH (ref 150–400)
RBC: 2.65 MIL/uL — ABNORMAL LOW (ref 3.87–5.11)
RDW: 14.6 % (ref 11.5–15.5)
WBC: 12.5 10*3/uL — ABNORMAL HIGH (ref 4.0–10.5)
nRBC: 0 % (ref 0.0–0.2)

## 2023-03-23 LAB — HEPARIN LEVEL (UNFRACTIONATED)
Heparin Unfractionated: >1.1 [IU]/mL — ABNORMAL HIGH (ref 0.30–0.70)
Heparin Unfractionated: 0.49 [IU]/mL (ref 0.30–0.70)

## 2023-03-23 LAB — PATHOLOGIST SMEAR REVIEW

## 2023-03-23 LAB — MAGNESIUM
Magnesium: 1.8 mg/dL (ref 1.7–2.4)
Magnesium: 1.6 mg/dL — ABNORMAL LOW (ref 1.7–2.4)

## 2023-03-23 LAB — TRIGLYCERIDES: Triglycerides: 257 mg/dL — ABNORMAL HIGH (ref <150)

## 2023-03-23 LAB — PHOSPHORUS
Phosphorus: 3.4 mg/dL (ref 2.5–4.6)
Phosphorus: 1.6 mg/dL — ABNORMAL LOW (ref 2.5–4.6)

## 2023-03-23 MED ORDER — ORAL CARE MOUTH RINSE: 15.0000 mL | OROMUCOSAL | Status: DC | PRN

## 2023-03-23 MED ORDER — ORAL CARE MOUTH RINSE
15.0000 mL | OROMUCOSAL | Status: DC
  Administered 2023-03-23 – 2023-03-28 (×65): 15 mL via OROMUCOSAL

## 2023-03-23 MED ORDER — DEXMEDETOMIDINE HCL IN NACL 200 MCG/50ML IV SOLN
0.0000 ug/kg/h | INTRAVENOUS | Status: DC
  Administered 2023-03-23 (×3): 0.4 ug/kg/h via INTRAVENOUS
  Administered 2023-03-24: 0.8 ug/kg/h via INTRAVENOUS
  Administered 2023-03-24: 0.5 ug/kg/h via INTRAVENOUS
  Administered 2023-03-24: 0.7 ug/kg/h via INTRAVENOUS
  Administered 2023-03-25: 1 ug/kg/h via INTRAVENOUS
  Administered 2023-03-25: 0.8 ug/kg/h via INTRAVENOUS
  Administered 2023-03-25 (×2): 0.5 ug/kg/h via INTRAVENOUS
  Administered 2023-03-25: 1.1 ug/kg/h via INTRAVENOUS
  Administered 2023-03-26: 1.2 ug/kg/h via INTRAVENOUS
  Administered 2023-03-26: 0.9 ug/kg/h via INTRAVENOUS
  Administered 2023-03-26 (×2): 0.8 ug/kg/h via INTRAVENOUS
  Administered 2023-03-26: 0.9 ug/kg/h via INTRAVENOUS
  Administered 2023-03-26 – 2023-03-27 (×2): 0.8 ug/kg/h via INTRAVENOUS
  Administered 2023-03-27: 0.9 ug/kg/h via INTRAVENOUS
  Administered 2023-03-27 (×2): 0.8 ug/kg/h via INTRAVENOUS
  Administered 2023-03-27 – 2023-03-28 (×4): 0.7 ug/kg/h via INTRAVENOUS
  Administered 2023-03-28 (×2): 0.8 ug/kg/h via INTRAVENOUS
  Administered 2023-03-29: 1 ug/kg/h via INTRAVENOUS
  Administered 2023-03-29: 0.6 ug/kg/h via INTRAVENOUS
  Administered 2023-03-29: 1 ug/kg/h via INTRAVENOUS
  Administered 2023-03-29: 0.6 ug/kg/h via INTRAVENOUS
  Administered 2023-03-29: 1 ug/kg/h via INTRAVENOUS
  Administered 2023-03-30: 0.4 ug/kg/h via INTRAVENOUS
  Administered 2023-03-30: 0.8 ug/kg/h via INTRAVENOUS
  Administered 2023-03-30: 0.6 ug/kg/h via INTRAVENOUS
  Administered 2023-03-31: 0.5 ug/kg/h via INTRAVENOUS
  Administered 2023-03-31: 0.8 ug/kg/h via INTRAVENOUS
  Administered 2023-03-31: 0.7 ug/kg/h via INTRAVENOUS
  Administered 2023-03-31: 0.5 ug/kg/h via INTRAVENOUS
  Administered 2023-03-31 – 2023-04-01 (×2): 0.8 ug/kg/h via INTRAVENOUS
  Administered 2023-04-01: 0.9 ug/kg/h via INTRAVENOUS
  Administered 2023-04-01: 0.3 ug/kg/h via INTRAVENOUS
  Administered 2023-04-01: 0.8 ug/kg/h via INTRAVENOUS
  Administered 2023-04-02 (×2): 1 ug/kg/h via INTRAVENOUS
  Filled 2023-03-23 (×48): qty 50

## 2023-03-23 MED ORDER — POTASSIUM PHOSPHATES 15 MMOLE/5ML IV SOLN
30.0000 mmol | Freq: Once | INTRAVENOUS | Status: AC
  Administered 2023-03-23: 30 mmol via INTRAVENOUS
  Filled 2023-03-23: qty 10

## 2023-03-23 MED ORDER — DEXTROSE 10 % IV SOLN: INTRAVENOUS | Status: AC

## 2023-03-23 MED ORDER — DEXTROSE 50 % IV SOLN
INTRAVENOUS | Status: AC
  Administered 2023-03-23: 25 mL
  Filled 2023-03-23: qty 50

## 2023-03-23 MED ORDER — VITAL AF 1.2 CAL PO LIQD
1000.0000 mL | ORAL | Status: DC
  Administered 2023-03-23 – 2023-04-04 (×11): 1000 mL

## 2023-03-23 MED ORDER — MAGNESIUM SULFATE 4 GM/100ML IV SOLN
4.0000 g | Freq: Once | INTRAVENOUS | Status: AC
  Administered 2023-03-23: 4 g via INTRAVENOUS
  Filled 2023-03-23: qty 100

## 2023-03-23 NOTE — Progress Notes (Signed)
PT Cancellation Note  Patient Details Name: Karen [name] MRN: [id number] DOB: 12-31-1950   Cancelled Treatment:    Reason Eval/Treat Not Completed: Medical issues which prohibited therapy, PT will sign off due to multiple k=medical problems, on vent. Please reorder when patient stable  for therapy. Blanchard Kelch PT Acute Rehabilitation Services Office [phone] Weekend [phone]    Rada Hay 03/23/2023, 7:52 AM

## 2023-03-23 NOTE — Progress Notes (Signed)
PHARMACY - HEPARIN  Heparin gtt infusing @ 1200 units/hr for PE  Heparin level this AM > 1.1  Heparin level has been therapeutic at current rate x 6 days.  Spoke with RN who relayed that the heparin level was drawn from the central line, which is where heparin is infusing.  RN to change location of heparin infusion and will redraw heparin level.  Terrilee Files, PharmD

## 2023-03-23 NOTE — Progress Notes (Addendum)
eLink Physician-Brief Progress Note Patient Name: Karen [name] DOB: Feb 27, 1951 MRN: [id number]   Date of Service  03/23/2023  HPI/Events of Note  Phos = 1.6 Mg = 1.6  eICU Interventions  K-Phos and magnesium sulfate given   2342 - Now becoming hypotensive +450 cc for the day. Start Norepi as needed.   Intervention Category Minor Interventions: Electrolytes abnormality - evaluation and management  [name] 03/23/2023, 8:47 PM

## 2023-03-23 NOTE — Progress Notes (Signed)
Pharmacy Antibiotic Note  Karen [name] is a 72 y.o. female admitted on 03/08/2023 with AMS and hypoxia.  Initial antibiotics narrowed and then stopped after 5 day course, Vanc resumed 11/28 for tracheal aspirate with staph aureus.  She was re-intubated on 12/2 and Pharmacy has been consulted for Vancomycin, Zosyn dosing.  Plan: Continue Zosyn 3.375g IV Q8H infused over 4hrs.  Continue Vancomycin 1250 mg IV q24h.  (SCr rounded to 0.8, est AUC 535) Measure Vanc levels as needed.  Goal AUC = 400 - 550. Follow up renal function, culture results, and clinical course.   Height: 5\' 4"  (162.6 cm) Weight: 59.2 kg (130 lb 8.2 oz) IBW/kg (Calculated) : 54.7  Temp (24hrs), Avg:100.2 F (37.9 C), Min:98.8 F (37.1 C), Max:101.2 F (38.4 C)  Recent Labs  Lab 03/18/23 0455 03/19/23 0037 03/20/23 0549 03/21/23 0320 03/21/23 1750 03/22/23 0509 03/23/23 0435  WBC 9.7 10.6* 12.8* 12.3*  --  10.6* 12.5*  CREATININE 0.34*  --   --  0.63 0.72 0.56 0.56    Estimated Creatinine Clearance: 56.5 mL/min (by C-G formula based on SCr of 0.56 mg/dL).    Allergies  Allergen Reactions   Zoloft [Sertraline Hcl] Anxiety and Other (See Comments)    "crazy feeling"    Antimicrobials this admission: 11/22 vancomycin >> 11/25, 11/28 >>  11/22 ampicillin >> 11/23 11/22 ceftriaxone >> 11/26 11/22 acyclovir >> 11/23 12/2 Zosyn >>   Dose adjustments this admission: 12/2 Vanc renal dose adjustment  Microbiology results: 11/20 BCx: NGF 11/20 resp panel: negative 11/21 MRSA PCR: negative 11/22 CSF culture: NGF 11/21 meningitis/encephalitis: negative 11/25 Trach aspirate: rare staph aureus (sens pending), few C. Albicans  12/2 BAL: staph aureus  Thank you for allowing pharmacy to be a part of this patient's care.  Lynann Beaver PharmD, BCPS [facility] main pharmacy [phone] 03/23/2023 9:12 AM

## 2023-03-23 NOTE — IPAL (Signed)
  Interdisciplinary Goals of Care Family Meeting   Date carried out: 03/23/2023  Location of the meeting: Conference room  Member's involved: Physician and Family Member or next of kin Karen Sams NP  Durable Power of Attorney or Environmental health practitioner: Husband in conjunction with kids    Discussion: We discussed goals of care for Karen [name] .    We have longstanding ill-explained CO2 retention On top of this unintentional 80 lbs weight loss and food aversion without obvious aspiration symptoms; extensive neuro workup including LP/MRI performed (brain + C spine) Over past month worsening cognition intermittent Current state is a complete inability to expectorate oral secretions resulting in pooling and aspiration; reintubated twice now We have been unable to elicit her wishes to date due to intermittent encephalopathy complicated by need for sedation for ETT vs. work of breathing when we try extubation  Options Trach now Vent trial then 1 way extubation Extubation with trach if fails Trach if fails SBT Once airway established, give time to see if she can answer for herself what she wants If able to, just do what she would like: trach/SNF + TF to see if condition improves over time vs switching to comfort If not able to after 1-2 weeks, would advocate comfort care   Code status:   Code Status: Full Code   Disposition:  They have seen multiple docs without a unifying diagnosis.  Before going forward with trach, they would like some time to process and another physician to review the case with fresh eyes in its entirety.  I am going to ask Dr. Chestine Spore to review everything and see if she sees a better path forward.  In meantime, RD is going to send some micronutrient panels to see how much malnutrition could be playing a role in her overall state.  We are also going to lighten sedation to see if we can get her to answer for herself what she would want (with ETT in  place).  Time spent for the meeting: 25 mins    Lorin Glass, MD  03/23/2023, 10:16 AM

## 2023-03-23 NOTE — Plan of Care (Signed)
  Problem: Education: Goal: Knowledge of General Education information will improve Description: Including pain rating scale, medication(s)/side effects and non-pharmacologic comfort measures Outcome: Progressing   Problem: Health Behavior/Discharge Planning: Goal: Ability to manage health-related needs will improve Outcome: Progressing   Problem: Clinical Measurements: Goal: Ability to maintain clinical measurements within normal limits will improve Outcome: Progressing Goal: Will remain free from infection Outcome: Progressing Goal: Diagnostic test results will improve Outcome: Progressing Goal: Respiratory complications will improve Outcome: Progressing Goal: Cardiovascular complication will be avoided Outcome: Progressing   Problem: Activity: Goal: Risk for activity intolerance will decrease Outcome: Progressing   Problem: Nutrition: Goal: Adequate nutrition will be maintained Outcome: Progressing   Problem: Coping: Goal: Level of anxiety will decrease Outcome: Progressing   Problem: Elimination: Goal: Will not experience complications related to urinary retention Outcome: Progressing   Problem: Pain Management: Goal: General experience of comfort will improve Outcome: Progressing   Problem: Safety: Goal: Ability to remain free from injury will improve Outcome: Progressing   Problem: Skin Integrity: Goal: Risk for impaired skin integrity will decrease Outcome: Progressing   Problem: Safety: Goal: Non-violent Restraint(s) Outcome: Completed/Met   Cindy S. Clelia Croft BSN, RN, CCRP, CCRN 03/23/2023 3:17 AM

## 2023-03-23 NOTE — Progress Notes (Signed)
NAME:  Karen [name], MRN:  [id number], DOB:  October 17, 1950, LOS: 15 ADMISSION DATE:  03/08/2023, CONSULTATION DATE:  03/08/23  REFERRING MD: MD Halivand CHIEF COMPLAINT:  AMS, hypoxia   History of Present Illness:  Pt is a 72 yr old female with a significant pmh of HTN, Hypothyroidism, Major Depressive Disorder, cognitive impairment, arthritis, hearing loss, 30-40lb unintentional weight loss since 05/2022. Per patient's husband and daughter endorse that patient has had a functional and cognitive decline since the beginning of the 2024. Patient has had an extensive workup in regards to cognitive impairment and unintentional weight loss with MRI Brain, MRCPs, CT of chest and abdomen with no definitive etiology.   Patient presented to [facility] ED with AMS and hypoxia (O2 Sats in 70s-80s) by EMS from [facility], who was scheduled for a colonoscopy on 11/20. Patient did not receive colonoscopy due to hypoxia and AMS. Initial VBG revealed pH 7.17 and pCO2 > 123. Patient was given narcan during the initial ED workup with no response and subsequently placed on BIPAP due to increasing hypoxia and respiratory distress. Repeat VBG showed no improvement and patient was intubated due to hypoxia/hypercarbia. Also, CT of chest obtained by EDP showed evidence of scattered subsegmental PE and PCCM was consulted for further vent management as well as management for PE.   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   Significant Hospital Events: Including procedures, antibiotic start and stop dates in addition to other pertinent events   11/20 Admit with AMS, acute hypoxic/hypercarbic respiratory failure, subsegmental PE intubated PCCM consult 11/21 Intubated, sedated, tremors  11/22 Febrile, tachycardic, shivering/tremors seen with physical touch. IR LP obtained 11/23 Neuro consulted. MRI brain ordered 11/28 weaning but high respiratory rate, pressure support of 10 11/30 extubated.  Requiring BiPAP.  Transfused 1 unit packed red cells 12/2 mucus plug reintubated, bronch with therapeutic aspiration of RUL, LUL, lingula, bronchus intermedius 12/3 CXR improved, extubated 12/4 reintubation after further discussions with family with bronch 12/5 MRI of C-Spine-neg for spinal infection/cord compression, remains on vent.   Interim History / Subjective:  Intubated/Sedated   Objective   Blood pressure (!) 81/36, pulse 98, temperature 99.5 F (37.5 C), resp. rate (!) 24, height 5\' 4"  (1.626 m), weight 59.2 kg, SpO2 100%.    Vent Mode: PRVC FiO2 (%):  [30 %-50 %] 30 % Set Rate:  [24 bmp] 24 bmp Vt Set:  [440 mL] 440 mL PEEP:  [8 cmH20-10 cmH20] 8 cmH20 Plateau Pressure:  [20 cmH20-24 cmH20] 22 cmH20   Intake/Output Summary (Last 24 hours) at 03/23/2023 6578 Last data filed at 03/23/2023 0559 Gross per 24 hour  Intake 1255.44 ml  Output 3375 ml  Net -2119.56 ml   Filed Weights   03/21/23 0400 03/22/23 0500 03/23/23 0500  Weight: 61.6 kg 61.6 kg 59.2 kg   Physical Exam: General: Chronically ill appearing older female, on vent Neuro: RASS -1 to -2, responds to pain, not following commands  HEENT: Normocephalic, Missing teeth/poor dentition, Pink MM, ETT,  OG  Cardiovascular: s1, s2, RRR, No JVD  Lungs: clear  diminished throughout lung bases BL, on vent, no respiratory distress  Abdomen: Active BS, soft  Musculoskeletal: atrophy, deconditioned, not pitting edema lower extremities  Skin: dry  GU: intact, foley   Assessment & Plan:  Acute hypoxemic respiratory failure  Subsegmental PEs  Chronic ongoing aspiration events  11/20 Subsegmental Pulmonary Embolisms with LLE DVT  ECHO EF 70-75%  on 11/21 no evidence of RV heart strain  Pt extubated on 11/30, reintubated due to unable to maintain secretions/mucus plugging. Extubated once more on 12/3 and reintubated on 12/4 for aspiration of secretions/mucus plugging  12/4 MRI C-spine neg for infectious process, or cord compression that would contribute to diaphragmatic weakness  P:  Continue SBT/WUA as tolerated  Continue VAP/PAD protocols  Wean fentanyl/propofol gtt, RASS goal -1  Continue Adequate Pulmonary Hygiene  Establish GOC during family meeting in regards to possibly trach vs comfort Continue Heparin gtt  Continue Vanc/Zosyn, may dc if PCT is low   Fluid Overload ~ 10 L positive  Received lasix 40mg  x 2 with albumin  Urine output >3 liters/last 2hrs  BP soft with sedation  P:  Hold lasix for now due to hypotension  Continue to monitor UO per foley   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  secondary to neurocognitive disorder (dementia) vs chronic aspiration  Extensive workup outpatient with unrevealing work up to date   ~80lb wt loss since beginning of 2024  P:  Continue tube feeding with advancement per RDs recs  Continue Thiamine IV daily   Hypoglycemia P: CBG q 4  Continue tube feeds   HTN Episodes of hypotension related to sedation  P:  Hold home antihypertensives  Wean sedation as tolerated  MAP Goal > 65, continue to use levophed   HLD  P:  Continue rosuvastatin daily per tube   Hypothyroidism P:  Continue levothyroxine per tube   Anxiety  Depression  Dementia P: Continue aricept daily   Best Practice  (right click and "Reselect all SmartList Selections" daily)   Diet/type: TUBE FEEDS DVT prophylaxis: systemic heparin for now Pressure ulcer(s). None present GI prophylaxis: PPI Lines: N/A Foley:  Yes, and it is still needed Code Status:  full code Last date of multidisciplinary goals of care discussion: will update family 12/5 at [facility] meeting scheduled at 1000   CC time: 30 min.   Hazel Sams AGACNP-BC    Pulmonary & Critical Care 03/23/2023, 9:36 AM  Please see [email or web address] for pager details.  From 7A-7P if no response, please call [phone]. After hours, please call ELink [phone].

## 2023-03-23 NOTE — Progress Notes (Signed)
PHARMACY - ANTICOAGULATION CONSULT NOTE  Pharmacy Consult for heparin Indication: acute pulmonary embolus  Allergies  Allergen Reactions   Zoloft [Sertraline Hcl] Anxiety and Other (See Comments)    "crazy feeling"    Patient Measurements: Height: 5\' 4"  (162.6 cm) Weight: 59.2 kg (130 lb 8.2 oz) IBW/kg (Calculated) : 54.7 Heparin Dosing Weight: TBW  Vital Signs: Temp: 99.5 F (37.5 C) (12/05 0700) Temp Source: Bladder (12/05 0554) BP: 81/36 (12/05 0700) Pulse Rate: 98 (12/05 0700)  Labs: Recent Labs    03/21/23 0320 03/21/23 1750 03/22/23 0509 03/23/23 0435 03/23/23 0549  HGB 8.1*  --  7.2* 8.0*  --   HCT 26.0*  --  24.6* 26.6*  --   PLT 591*  --  515* 566*  --   HEPARINUNFRC 0.43  --  0.56 >1.10* 0.49  CREATININE 0.63 0.72 0.56 0.56  --     Estimated Creatinine Clearance: 56.5 mL/min (by C-G formula based on SCr of 0.56 mg/dL).   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 Three variants of uncertain significance were detected - one in the AXIN2 gene called c.1829G>A, one in the PMS2 gene called c.964G>A, and one in the RNF43 gene called c.1114C>T. The report date is 06/24/2019.     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 She was transferred to the [facility] ED where CTA chest revealed scattered subsegmental pulmonary emboli throughout the right lung, no RHS. She was intubated in the ED due to lack of improvement on BiPAP. No anticoagulation noted PTA. Pharmacy consulted for heparin dosing.   Significant events: -11/21: LE Doppler: Findings consistent with age indeterminate DVT involving the left peroneal veins.  -11/22: lumbar puncture by IR  -11/30: extubated, 12/2 reintubated, bronch - 12/3 extubated, 12/4 re-intubated  Today, 03/23/2023: - Heparin level 0.49, remains therapeutic on 1200 units/hr - CBC: Hgb up to 8 (last transfused 11/30), Plt remain elevated - No complications of therapy noted, No bleeding reported by RN   Goal of Therapy:  Heparin level 0.3-0.7 units/ml Monitor platelets by anticoagulation protocol: Yes   Plan:  -Continue heparin infusion at 1200 units/hr -Daily CBC, heparin level -Monitor closely for s/sx of bleeding   Lynann Beaver PharmD, BCPS [facility] main pharmacy [phone] 03/23/2023 7:35 AM

## 2023-03-23 NOTE — Progress Notes (Signed)
Nutrition Follow-up  DOCUMENTATION CODES:   Non-severe (moderate) malnutrition in context of chronic illness  INTERVENTION:  - Restart tube feeds via OGT: Vital 1.2 at 55 ml/h  *Start at 57mL/hr and advance by 10mL Q6H to goal of 2mL/hr Provides 1584 kcal, 99 gm protein, 1070 ml free water daily   - Monitor magnesium, potassium, and phosphorus BID for at least 3 days, MD to replete as needed.   - FWF per CCM/MD  - Checking micronutrient labs Biotin, vitamin D, vitamin C, and copper.   - Monitor weight trends.   NUTRITION DIAGNOSIS:   Moderate Malnutrition related to chronic illness as evidenced by mild fat depletion, moderate muscle depletion, percent weight loss (23% in 9 months). *ongoing  GOAL:   Patient will meet greater than or equal to 90% of their needs *progressing, restarting TF's  MONITOR:   Vent status, Labs, Weight trends, TF tolerance  REASON FOR ASSESSMENT:   Consult Enteral/tube feeding initiation and management  ASSESSMENT:   72 yr old female with PMH significant of HTN, Hypothyroidism, MDD, cognitive impairment, arthritis who presented with AMS and hypoxia from [facility] after being scheduled for a colonoscopy.  11/20 Admit; Intubated  11/21 Vit 1.2 started at 53mL/hr 11/23 Reached goal of 51mL/hr 11/30 Extubated, OGT removed; Tube feeds stopped 12/2 Re-intubated, OGT placed 12/3 Extubated, OGT removed 12/4 Re-intubated; new OGT placed 12/5 restatint Vital 1.2 at 61mL/hr   Patient intubated at time of visit. Awake but restless and agitated.   Per CCM can restart tube feeds today. New OGT placed yesterday and xray verified in the stomach.   As patient has not received TF's since 11/30 will start back at 60mL/hr with titration to assess tolerance.   CCM MD also requesting micronutrient labs to see if deficiencies/malnutrition may be playing a role in overall state.  As patient dealing with cognitive issues (neurocognitive  disorder) with dementia, will order Biotin, vitamin D, vitamin C, and copper. Vitamin B12 (high), thiamine (high), and vitamin B6 (low, supplementating) already checked.    Admit weight: 122# Current weight: 130# I&O's: +11L + mild pitting generalized edema  Medications reviewed and include: Colace, Miralax, 100mg  vitamin B6, 100mg  thiamine (has completed high dose therapy), D10 @ 69mL/hr (ending at 1559) Precedex Fentanyl  Labs reviewed:  Triglycerides 257 (as of 12/5)   Latest Reference Range & Units 03/11/23 14:50 03/12/23 08:12  Vitamin B1 (Thiamine) 66.5 - 200.0 nmol/L  252.2 (H)  Vitamin B12 180 - 914 pg/mL 965 (H)   Vitamin B6 3.4 - 65.2 ug/L 1.9 (L)   (H): Data is abnormally high (L): Data is abnormally low   Diet Order:   Diet Order             Diet NPO time specified  Diet effective now                   EDUCATION NEEDS:  Education needs have been addressed  Skin:  Skin Assessment: Reviewed RN Assessment  Last BM:  12/4  Height:  Ht Readings from Last 1 Encounters:  03/22/23 5\' 4"  (1.626 m)   Weight:  Wt Readings from Last 1 Encounters:  03/23/23 59.2 kg    BMI:  Body mass index is 22.4 kg/m.  Estimated Nutritional Needs:  Kcal:  1550-1750 Protein:  85-100g Fluid:  1.7L/day    Shelle Iron RD, LDN Contact via Secure Chat.

## 2023-03-23 NOTE — Plan of Care (Signed)
  Problem: Clinical Measurements: Goal: Ability to maintain clinical measurements within normal limits will improve Outcome: Progressing Goal: Cardiovascular complication will be avoided Outcome: Progressing   Problem: Education: Goal: Knowledge of General Education information will improve Description: Including pain rating scale, medication(s)/side effects and non-pharmacologic comfort measures Outcome: Not Progressing   Problem: Health Behavior/Discharge Planning: Goal: Ability to manage health-related needs will improve Outcome: Not Progressing   Problem: Clinical Measurements: Goal: Will remain free from infection Outcome: Not Progressing Goal: Diagnostic test results will improve Outcome: Not Progressing Goal: Respiratory complications will improve Outcome: Not Progressing

## 2023-03-24 DIAGNOSIS — J9601 Acute respiratory failure with hypoxia: Secondary | ICD-10-CM | POA: Diagnosis not present

## 2023-03-24 DIAGNOSIS — G1229 Other motor neuron disease: Secondary | ICD-10-CM

## 2023-03-24 DIAGNOSIS — J9602 Acute respiratory failure with hypercapnia: Secondary | ICD-10-CM | POA: Diagnosis not present

## 2023-03-24 LAB — GLUCOSE, CAPILLARY
Glucose-Capillary: 106 mg/dL — ABNORMAL HIGH (ref 70–99)
Glucose-Capillary: 110 mg/dL — ABNORMAL HIGH (ref 70–99)
Glucose-Capillary: 117 mg/dL — ABNORMAL HIGH (ref 70–99)
Glucose-Capillary: 117 mg/dL — ABNORMAL HIGH (ref 70–99)
Glucose-Capillary: 119 mg/dL — ABNORMAL HIGH (ref 70–99)
Glucose-Capillary: 126 mg/dL — ABNORMAL HIGH (ref 70–99)

## 2023-03-24 LAB — CULTURE, RESPIRATORY W GRAM STAIN

## 2023-03-24 LAB — CBC
HCT: 24.8 % — ABNORMAL LOW (ref 36.0–46.0)
Hemoglobin: 7.7 g/dL — ABNORMAL LOW (ref 12.0–15.0)
MCH: 29.6 pg (ref 26.0–34.0)
MCHC: 31 g/dL (ref 30.0–36.0)
MCV: 95.4 fL (ref 80.0–100.0)
Platelets: 665 10*3/uL — ABNORMAL HIGH (ref 150–400)
RBC: 2.6 MIL/uL — ABNORMAL LOW (ref 3.87–5.11)
RDW: 14.7 % (ref 11.5–15.5)
WBC: 10.7 10*3/uL — ABNORMAL HIGH (ref 4.0–10.5)
nRBC: 0 % (ref 0.0–0.2)

## 2023-03-24 LAB — PHOSPHORUS
Phosphorus: 3.8 mg/dL (ref 2.5–4.6)
Phosphorus: 2.6 mg/dL (ref 2.5–4.6)

## 2023-03-24 LAB — BASIC METABOLIC PANEL
Anion gap: 8 (ref 5–15)
BUN: 17 mg/dL (ref 8–23)
CO2: 27 mmol/L (ref 22–32)
Calcium: 8.6 mg/dL — ABNORMAL LOW (ref 8.9–10.3)
Chloride: 98 mmol/L (ref 98–111)
Creatinine, Ser: 0.75 mg/dL (ref 0.44–1.00)
GFR, Estimated: >60 mL/min (ref >60)
Glucose, Bld: 128 mg/dL — ABNORMAL HIGH (ref 70–99)
Potassium: 2.9 mmol/L — ABNORMAL LOW (ref 3.5–5.1)
Sodium: 133 mmol/L — ABNORMAL LOW (ref 135–145)

## 2023-03-24 LAB — HEPARIN LEVEL (UNFRACTIONATED): Heparin Unfractionated: 0.39 [IU]/mL (ref 0.30–0.70)

## 2023-03-24 LAB — MAGNESIUM
Magnesium: 2.5 mg/dL — ABNORMAL HIGH (ref 1.7–2.4)
Magnesium: 2.2 mg/dL (ref 1.7–2.4)

## 2023-03-24 LAB — VITAMIN D 25 HYDROXY (VIT D DEFICIENCY, FRACTURES): Vit D, 25-Hydroxy: 30.33 ng/mL (ref 30–100)

## 2023-03-24 MED ORDER — SODIUM CHLORIDE 0.9 % IV SOLN
3.0000 g | Freq: Four times a day (QID) | INTRAVENOUS | Status: AC
  Administered 2023-03-24 – 2023-03-29 (×21): 3 g via INTRAVENOUS
  Filled 2023-03-24 (×22): qty 8

## 2023-03-24 MED ORDER — GLYCOPYRROLATE 0.2 MG/ML IJ SOLN
0.1000 mg | Freq: Three times a day (TID) | INTRAMUSCULAR | Status: AC
  Administered 2023-03-24 – 2023-03-26 (×6): 0.1 mg via INTRAVENOUS
  Filled 2023-03-24 (×5): qty 1

## 2023-03-24 MED ORDER — POTASSIUM CHLORIDE 20 MEQ PO PACK
40.0000 meq | PACK | Freq: Once | ORAL | Status: AC
Start: 2023-03-24 — End: 2023-03-24
  Administered 2023-03-24: 40 meq
  Filled 2023-03-24: qty 2

## 2023-03-24 MED ORDER — POTASSIUM CHLORIDE 20 MEQ PO PACK
40.0000 meq | PACK | Freq: Once | ORAL | Status: AC
  Administered 2023-03-24: 40 meq
  Filled 2023-03-24: qty 2

## 2023-03-24 NOTE — Progress Notes (Signed)
[facility] ADULT ICU REPLACEMENT PROTOCOL   The patient does apply for the [facility] Adult ICU Electrolyte Replacment Protocol based on the criteria listed below:   1.Exclusion criteria: TCTS, ECMO, Dialysis, and Myasthenia Gravis patients 2. Is GFR >/= 30 ml/min? Yes.    Patient's GFR today is >60 3. Is SCr </= 2? Yes.   Patient's SCr is 0.75 mg/dL 4. Did SCr increase >/= 0.5 in 24 hours? No. 5.Pt's weight >40kg  Yes.   6. Abnormal electrolyte(s): K+ =2.9  7. Electrolytes replaced per protocol 8.  Call MD STAT for K+ </= 2.5, Phos </= 1, or Mag </= 1 Physician:  Delia Chimes, eMD  Faye Ramsay 03/24/2023 6:23 AM

## 2023-03-24 NOTE — Progress Notes (Signed)
NAME:  Karen [name], MRN:  [id number], DOB:  1950/10/30, LOS: 16 ADMISSION DATE:  03/08/2023, CONSULTATION DATE:  03/08/23  REFERRING MD: MD Halivand CHIEF COMPLAINT:  AMS, hypoxia   History of Present Illness:  Pt is a 72 yr old female with a significant pmh of HTN, Hypothyroidism, Major Depressive Disorder, cognitive impairment, arthritis, hearing loss, 30-40lb unintentional weight loss since 05/2022. Per patient's husband and daughter endorse that patient has had a functional and cognitive decline since the beginning of the 2024. Patient has had an extensive workup in regards to cognitive impairment and unintentional weight loss with MRI Brain, MRCPs, CT of chest and abdomen with no definitive etiology.   Patient presented to [facility] ED with AMS and hypoxia (O2 Sats in 70s-80s) by EMS from [facility], who was scheduled for a colonoscopy on 11/20. Patient did not receive colonoscopy due to hypoxia and AMS. Initial VBG revealed pH 7.17 and pCO2 > 123. Patient was given narcan during the initial ED workup with no response and subsequently placed on BIPAP due to increasing hypoxia and respiratory distress. Repeat VBG showed no improvement and patient was intubated due to hypoxia/hypercarbia. Also, CT of chest obtained by EDP showed evidence of scattered subsegmental PE and PCCM was consulted for further vent management as well as management for PE.   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   Significant Hospital Events: Including procedures, antibiotic start and stop dates in addition to other pertinent events   11/20 Admit with AMS, acute hypoxic/hypercarbic respiratory failure, subsegmental PE intubated PCCM consult 11/21 Intubated, sedated, tremors  11/22 Febrile, tachycardic, shivering/tremors seen with physical touch. IR LP obtained 11/23 Neuro consulted. MRI brain ordered 11/28 weaning but high respiratory rate, pressure support of 10 11/30 extubated.  Requiring BiPAP.  Transfused 1 unit packed red cells 12/2 mucus plug reintubated, bronch with therapeutic aspiration of RUL, LUL, lingula, bronchus intermedius 12/3 CXR improved, extubated 12/4 reintubation after further discussions with family with bronch 12/5 MRI of C-Spine-neg for spinal infection/cord compression, remains on vent.  12/6 patient following commands, on precedex, will need to readdress GOC with patient and patient family   Interim History / Subjective:  Intubated/Sedated but responding   Objective   Blood pressure 118/74, pulse 78, temperature 99.7 F (37.6 C), temperature source Oral, resp. rate (!) 24, height 5\' 4"  (1.626 m), weight 59.6 kg, SpO2 100%.    Vent Mode: PRVC FiO2 (%):  [30 %] 30 % Set Rate:  [24 bmp] 24 bmp Vt Set:  [440 mL] 440 mL PEEP:  [8 cmH20] 8 cmH20 Plateau Pressure:  [22 cmH20-23 cmH20] 22 cmH20   Intake/Output Summary (Last 24 hours) at 03/24/2023 1646 Last data filed at 03/24/2023 1315 Gross per 24 hour  Intake 2573.25 ml  Output 525 ml  Net 2048.25 ml   Filed Weights   03/22/23 0500 03/23/23 0500 03/24/23 0410  Weight: 61.6 kg 59.2 kg 59.6 kg   Physical Exam: General: Chronically ill appearing older female, on vent Neuro:  RASS 0, following commands HEENT: Normocephalic,  ETT/OG, Pink MM, missing teeth Cardiovascular: s1,s2, RRR, no JVD Lungs: Clear diminished throughout lung bases BL, on vent, no distress Abdomen: active BS, soft  Musculoskeletal: atrophy, deconditioned, not pitting edema lower extremities  Skin: dry  GU: intact, foley, urine   Assessment & Plan:  Acute hypoxemic respiratory failure  Subsegmental PEs  Chronic ongoing aspiration events  11/20 Subsegmental Pulmonary Embolisms with LLE DVT  ECHO EF 70-75%  on 11/21 no evidence of RV heart strain  Pt extubated on 11/30, reintubated due to unable to maintain secretions/mucus plugging. Extubated once more on 12/3 and reintubated on 12/4 for aspiration of secretions/mucus plugging  12/4 MRI C-spine neg for infectious process, or cord compression that would contribute to diaphragmatic weakness  Staph aureus growing in tracheal aspirate from 12/2   P:  Continue SBT/WUA as tolerated Continue VAD/Pad protocols- continue precedex/fentanyl gtt  Continue RASS goal of 0 to -1  Continue Adequate pulmonary hygiene Due to 2 failed extubation attempts, GOC need to be addressed with family in regards to trach vs comfort care Continue heparin gtt DC vanc/zosyn to unasyn   Fluid Overload ~ 10 L positive  Received lasix 40mg  x 2 with albumin on 12/4  BP soft with sedation  P: Patient making urine, continue  Hold lasix for now due to hypotension  Continue to monitor UO per foley   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  secondary to neurocognitive disorder (dementia) vs chronic aspiration  Extensive workup outpatient with unrevealing work up to date   ~80lb wt loss since beginning of 2024  P:  Continue tube feeding  Continue Thiamine IV daily  Continue tube feeding with advancement per RDs recs  Continue Thiamine IV daily   Hypokalemia  K 2.9 Elink replaced with once P: Give another 40 meq PO   Hypoglycemia CBGs improving with tube feeds  P: CBG q 4 Continue tube feeds    HTN Episodes of hypotension related to sedation  Low dose pressor  P:  Continue to hold antihypertensives  Wean sedation as tolerated Wean levophed   HLD  P:  Continue rosuvastatin per tube   Hypothyroidism P:  Continue levothyroxine per tube   Anxiety  Depression  Dementia P: Continue aricept    Best Practice (right click and "Reselect all SmartList Selections" daily)   Diet/type: TUBE FEEDS DVT prophylaxis: systemic heparin for now Pressure ulcer(s). None present GI prophylaxis: PPI Lines: N/A Foley:  Yes, and it is still needed Code Status:  full code Last date of multidisciplinary goals of care discussion: will update family 12/5 at [facility] meeting scheduled at 1000   CC time: 32 mins   Christian Fares Ramthun AGACNP-BC   [facility] 03/24/2023, 4:46 PM  Please see [email or web address] for pager details.  From 7A-7P if no response, please call [phone]. After hours, please call ELink [phone].

## 2023-03-24 NOTE — Plan of Care (Signed)
  Problem: Education: Goal: Knowledge of General Education information will improve Description: Including pain rating scale, medication(s)/side effects and non-pharmacologic comfort measures Outcome: Progressing   Problem: Health Behavior/Discharge Planning: Goal: Ability to manage health-related needs will improve Outcome: Progressing   Problem: Clinical Measurements: Goal: Ability to maintain clinical measurements within normal limits will improve Outcome: Progressing Goal: Will remain free from infection Outcome: Progressing Goal: Diagnostic test results will improve Outcome: Progressing Goal: Respiratory complications will improve Outcome: Progressing Goal: Cardiovascular complication will be avoided Outcome: Progressing   Problem: Activity: Goal: Risk for activity intolerance will decrease Outcome: Progressing   Problem: Nutrition: Goal: Adequate nutrition will be maintained Outcome: Progressing   Problem: Coping: Goal: Level of anxiety will decrease Outcome: Progressing   Problem: Elimination: Goal: Will not experience complications related to urinary retention Outcome: Progressing   Problem: Pain Management: Goal: General experience of comfort will improve Outcome: Progressing   Problem: Safety: Goal: Ability to remain free from injury will improve Outcome: Progressing   Problem: Skin Integrity: Goal: Risk for impaired skin integrity will decrease Outcome: Progressing   Cindy S. Clelia Croft BSN, RN, CCRP, CCRN 03/24/2023 3:46 AM

## 2023-03-24 NOTE — Progress Notes (Signed)
PHARMACY - ANTICOAGULATION CONSULT NOTE  Pharmacy Consult for heparin Indication: acute pulmonary embolus  Allergies  Allergen Reactions   Zoloft [Sertraline Hcl] Anxiety and Other (See Comments)    "crazy feeling"    Patient Measurements: Height: 5\' 4"  (162.6 cm) Weight: 59.6 kg (131 lb 6.3 oz) IBW/kg (Calculated) : 54.7 Heparin Dosing Weight: TBW  Vital Signs: Temp: 99.9 F (37.7 C) (12/06 0415) Temp Source: Axillary (12/06 0415) BP: 108/59 (12/06 0703) Pulse Rate: 93 (12/06 0703)  Labs: Recent Labs    03/22/23 0509 03/23/23 0435 03/23/23 0549 03/24/23 0501  HGB 7.2* 8.0*  --   --   HCT 24.6* 26.6*  --   --   PLT 515* 566*  --   --   HEPARINUNFRC 0.56 >1.10* 0.49 0.39  CREATININE 0.56 0.56  --  0.75    Estimated Creatinine Clearance: 56.5 mL/min (by C-G formula based on SCr of 0.75 mg/dL).   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 Three variants of uncertain significance were detected - one in the AXIN2 gene called c.1829G>A, one in the PMS2 gene called c.964G>A, and one in the RNF43 gene called c.1114C>T. The report date is 06/24/2019.     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 She was transferred to the [facility] ED where CTA chest revealed scattered subsegmental pulmonary emboli throughout the right lung, no RHS. She was intubated in the ED due to lack of improvement on BiPAP. No anticoagulation noted PTA. Pharmacy consulted for heparin dosing.   Significant events: -11/21: LE Doppler: Findings consistent with age indeterminate DVT involving the left peroneal veins.  -11/22: lumbar puncture by IR  -11/30: extubated, 12/2 reintubated, bronch - 12/3 extubated, 12/4 re-intubated  Today, 03/24/2023: - Heparin level 0.39, remains therapeutic on 1200 units/hr - CBC: Hgb slightly down at 7.7 (last transfused 11/30), Plt remain elevated - No complications of therapy noted, No bleeding reported.  Goal of Therapy:  Heparin level 0.3-0.7 units/ml Monitor platelets by anticoagulation protocol: Yes   Plan:  -Continue heparin infusion at 1200 units/hr -Daily CBC, heparin level -Monitor closely for s/sx of bleeding   Cherylin Mylar, PharmD Clinical Pharmacist  12/6/20247:35 AM

## 2023-03-24 NOTE — Progress Notes (Signed)
NAME:  Karen [name], MRN:  [id number], DOB:  [birth year], LOS: 16 ADMISSION DATE:  03/08/2023, CONSULTATION DATE:  03/08/23  REFERRING MD: MD Halivand CHIEF COMPLAINT:  AMS, hypoxia   History of Present Illness:  Pt is a 72 yr old female with a significant pmh of HTN, Hypothyroidism, Major Depressive Disorder, cognitive impairment, arthritis, hearing loss, 30-40lb unintentional weight loss since 05/2022. Per patient's husband and daughter endorse that patient has had a functional and cognitive decline since the beginning of the 2024. Patient has had an extensive workup in regards to cognitive impairment and unintentional weight loss with MRI Brain, MRCPs, CT of chest and abdomen with no definitive etiology.   Patient presented to [facility] ED with AMS and hypoxia (O2 Sats in 70s-80s) by EMS from [facility], who was scheduled for a colonoscopy on 11/20. Patient did not receive colonoscopy due to hypoxia and AMS. Initial VBG revealed pH 7.17 and pCO2 > 123. Patient was given narcan during the initial ED workup with no response and subsequently placed on BIPAP due to increasing hypoxia and respiratory distress. Repeat VBG showed no improvement and patient was intubated due to hypoxia/hypercarbia. Also, CT of chest obtained by EDP showed evidence of scattered subsegmental PE and PCCM was consulted for further vent management as well as management for PE.   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   Significant Hospital Events: Including procedures, antibiotic start and stop dates in addition to other pertinent events   11/20 Admit with AMS, acute hypoxic/hypercarbic respiratory failure, subsegmental PE intubated PCCM consult 11/21 Intubated, sedated, tremors  11/22 Febrile, tachycardic, shivering/tremors seen with physical touch. IR LP obtained 11/23 Neuro consulted. MRI brain ordered 11/28 weaning but high respiratory rate, pressure support of 10 11/30 extubated.  Requiring BiPAP.  Transfused 1 unit packed red cells 12/2 mucus plug reintubated, bronch with therapeutic aspiration of RUL, LUL, lingula, bronchus intermedius 12/3 CXR improved, extubated 12/4 reintubation after further discussions with family with bronch 12/5 MRI of C-Spine-neg for spinal infection/cord compression, remains on vent.  12/6 patient following commands, on precedex, will need to readdress GOC with patient and patient family   Interim History / Subjective:  Intubated/Sedated but responding   Objective   Blood pressure (!) 108/59, pulse 93, temperature 99.7 F (37.6 C), temperature source Oral, resp. rate 19, height 5\' 4"  (1.626 m), weight 59.6 kg, SpO2 100%.    Vent Mode: PRVC FiO2 (%):  [30 %] 30 % Set Rate:  [24 bmp] 24 bmp Vt Set:  [440 mL] 440 mL PEEP:  [5 cmH20-8 cmH20] 8 cmH20 Plateau Pressure:  [21 cmH20-23 cmH20] 22 cmH20   Intake/Output Summary (Last 24 hours) at 03/24/2023 0935 Last data filed at 03/24/2023 0700 Gross per 24 hour  Intake 2657.3 ml  Output 700 ml  Net 1957.3 ml   Filed Weights   03/22/23 0500 03/23/23 0500 03/24/23 0410  Weight: 61.6 kg 59.2 kg 59.6 kg   Physical Exam: General: Chronically ill appearing older female, on vent  Neuro: RASS 0, following commands HEENT:  Normocephalic, ETT/OG, Pink MM, missing teeth Cardiovascular: s1,s2, RRR, no JVD Lungs: Clear diminished throughout lung bases BL, on vent, no distress Abdomen: active BS, soft  Musculoskeletal: atrophy, deconditioned, not pitting edema lower extremities  Skin: dry  GU: intact, foley, urine   Assessment & Plan:  Acute hypoxemic respiratory failure  Subsegmental PEs  Chronic ongoing aspiration events  11/20 Subsegmental Pulmonary Embolisms with LLE DVT  ECHO EF 70-75%  on 11/21 no evidence of RV heart strain  Pt extubated on 11/30, reintubated due to unable to maintain secretions/mucus plugging. Extubated once more on 12/3 and reintubated on 12/4 for aspiration of secretions/mucus plugging  12/4 MRI C-spine neg for infectious process, or cord compression that would contribute to diaphragmatic weakness  Staph aureus growing in tracheal aspirate from 12/2   P:  Continue SBT/WUA as tolerated Continue VAD/Pad protocols- continue precedex/fentanyl gtt  Continue RASS goal of 0 to -1  Continue Adequate pulmonary hygiene Due to 2 failed extubation attempts, GOC need to be addressed with family in regards to trach vs comfort care Continue heparin gtt DC vanc/zosyn to unasyn   Fluid Overload ~ 10 L positive  Received lasix 40mg  x 2 with albumin on 12/4  BP soft with sedation  P: Patient making urine, continue  Hold lasix for now due to hypotension  Continue to monitor UO per foley   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  secondary to neurocognitive disorder (dementia) vs chronic aspiration  Extensive workup outpatient with unrevealing work up to date   ~80lb wt loss since beginning of 2024  P:  Continue tube feeding  Continue Thiamine IV daily  Continue tube feeding with advancement per RDs recs  Continue Thiamine IV daily   Hypokalemia  K 2.9 Elink replaced with once P: Give another 40 meq PO   Hypoglycemia CBGs improving with tube feeds  P: CBG q 4 Continue tube feeds    HTN Episodes of hypotension related to sedation  Low dose pressor  P:  Continue to hold antihypertensives  Wean sedation as tolerated Wean levophed   HLD  P:  Continue rosuvastatin per tube   Hypothyroidism P:  Continue levothyroxine per tube   Anxiety  Depression  Dementia P: Continue aricept    Best Practice (right click and "Reselect all SmartList Selections" daily)   Diet/type: TUBE FEEDS DVT prophylaxis: systemic heparin for now Pressure ulcer(s). None present GI prophylaxis: PPI Lines: N/A Foley:  Yes, and it is still needed Code Status:  full code Last date of multidisciplinary goals of care discussion: will update family 12/5 at [facility] meeting scheduled at 1000   CC time: 32 mins   Christian Jannah Guardiola AGACNP-BC   [place] 03/24/2023, 9:35 AM  Please see [email or web address] for pager details.  From 7A-7P if no response, please call [phone]. After hours, please call ELink [phone].

## 2023-03-24 NOTE — Progress Notes (Signed)
eLink Physician-Brief Progress Note Patient Name: Karen [name] DOB: 12-Sep-1950 MRN: [id number]   Date of Service  03/24/2023  HPI/Events of Note  Continues to have excessive secretions  eICU Interventions  Resume Robinul     Intervention Category Minor Interventions: Routine modifications to care plan (e.g. PRN medications for pain, fever)  Lavender Stanke 03/24/2023, 8:21 PM

## 2023-03-24 NOTE — Progress Notes (Signed)
I updated Karen [name]'s husband Karen [name] via phone. He requested I also called his daughter Karen [name] to update her, which I did. We discussed my concern that whatever is causing her weakness and swallowing issues is likely not easily reversible nor something we can fully evaluate while she is intubated and remains critically ill. She is profoundly weak on my exam. We discussed possibility of tracheostomy; if she wants continued aggressive care, I do not see a pathway forward without one. Family needs to determine if this is something she would be ok with. I recommended they discuss this and attempt to meet with her next week to find out what we should want in this circumstance. Karen [name] would not necessarily fix the problem, but could allow ongoing testing.   Additional cc time: 16 min.  Steffanie Dunn, DO 03/24/23 5:43 PM [facility]  For contact information, see Amion. If no response to pager, please call PCCM consult pager. After hours, 7PM- 7AM, please call Elink.

## 2023-03-24 NOTE — Progress Notes (Signed)
Ms. Presley Raddle is a 72 y/o woman with a history of weight loss, dementia, dysphagia who presented with acute respiratory failure. She has had a prolonged decline predating this year, but much more rapid and significant in 2024. She has failed to make significant improvements so far this admission.    BP 120/74   Pulse 80   Temp 99.7 F (37.6 C) (Oral)   Resp (!) 24   Ht 5\' 4"  (1.626 m)   Wt 59.6 kg   SpO2 100%   BMI 22.55 kg/m  [place] ill-appearing woman lying in bed no acute distress [place]/AT, eyes anicteric RASS -1, arouses to verbal stimulation.  Sluggish to follow commands, able to wiggle her toes very weakly, squeezes fingers, able to open her mouth but not able to protrude her tongue on command.  Able to close her eyes on command. S1-S2, regular rate and rhythm Scattered rhonchi, minimal clear secretions from endotracheal tube. Abdomen soft, nontender, nondistended Skin warm, dry, no diffuse rashes Sodium 133 Potassium 2.9 BUN 17 Creatinine 0.75 Vitamin D level 30 WBC 10.7 H/H 7.7/24.8 Platelets 665  BAL culture: MSSA  Assessment & plan: Acute respiratory failure with hypoxia and hypercapnia requiring MV; 2 failed extubations now.  PE Chronic aspiration events> has required bronchs to clear out oral secretions once she gets intubated MSSA pneumonia -LTVV -VAP prevention protocol -PAD protocol -trach aspirate culture -con't heparin gtt; may need lifelong AC since either unprovoked or due to an undiagnosed malignancy -needs age appropriate cancer screening as an OP  Possible bulbar weakness vs encephalopathy causing her to have poor sensation and coordination of swallow. Looked liked she tried to swallow for me during exam but no elevation of glottis visible externally. No acute CVA on 03/11/23 MRI brain.  -worry about atypical causes like progressive neurological conditions, but unable to complete an assessment -paraneoplastic syndrome panel pending -correct electrolyte  abnormalities -At this point no convincing evidence that she will be able to manage her secretions and be successful with a third extubation attempt. -supplemental vitamins in case this is due to a deficiency  Dementia, acute encephalopathy- presumed metabolic vs ICU delirium Concern for seizures- negative -con't PTA aricept -avoid sedating meds where able  Hypervolemia -diuresing  Hypokalemia Hypophosphatemia, resolved -repleted -monitor  Hypervolemic hyponatremia -con't diuresing, monitor  FTT, severe protein energy malnutrition Refeeding syndrome -TF -ongoing GOC discussions needed -electrolyte repletion -vitamin and mineral levels pending  Hypothyroidism; stable on her dose of synthroid -con't PTA synthroid  Anemia Acute thrombocytosis; suspect this is reactive  GOC -Ongoing discussions. I unfortunately do not see a good way out for her due to frailty, severe malnutrition, advanced age, and previously incomplete workup that now makes it hard to know where to direct energy.   This patient is critically ill with multiple organ system failure which requires frequent high complexity decision making, assessment, support, evaluation, and titration of therapies. This was completed through the application of advanced monitoring technologies and extensive interpretation of multiple databases. During this encounter critical care time was devoted to patient care services described in this note for 60 minutes.  Steffanie Dunn, DO 03/24/23 5:21 PM [facility]  For contact information, see Amion. If no response to pager, please call PCCM consult pager. After hours, 7PM- 7AM, please call Elink.

## 2023-03-25 ENCOUNTER — Inpatient Hospital Stay (HOSPITAL_COMMUNITY): Payer: Medicare HMO

## 2023-03-25 DIAGNOSIS — J9601 Acute respiratory failure with hypoxia: Secondary | ICD-10-CM | POA: Diagnosis not present

## 2023-03-25 LAB — GLUCOSE, CAPILLARY
Glucose-Capillary: 120 mg/dL — ABNORMAL HIGH (ref 70–99)
Glucose-Capillary: 111 mg/dL — ABNORMAL HIGH (ref 70–99)
Glucose-Capillary: 109 mg/dL — ABNORMAL HIGH (ref 70–99)
Glucose-Capillary: 130 mg/dL — ABNORMAL HIGH (ref 70–99)
Glucose-Capillary: 137 mg/dL — ABNORMAL HIGH (ref 70–99)
Glucose-Capillary: 125 mg/dL — ABNORMAL HIGH (ref 70–99)

## 2023-03-25 LAB — CBC
HCT: 25.4 % — ABNORMAL LOW (ref 36.0–46.0)
Hemoglobin: 7.6 g/dL — ABNORMAL LOW (ref 12.0–15.0)
MCH: 29.6 pg (ref 26.0–34.0)
MCHC: 29.9 g/dL — ABNORMAL LOW (ref 30.0–36.0)
MCV: 98.8 fL (ref 80.0–100.0)
Platelets: 544 10*3/uL — ABNORMAL HIGH (ref 150–400)
RBC: 2.57 MIL/uL — ABNORMAL LOW (ref 3.87–5.11)
RDW: 15 % (ref 11.5–15.5)
WBC: 7 10*3/uL (ref 4.0–10.5)
nRBC: 0 % (ref 0.0–0.2)

## 2023-03-25 LAB — MAGNESIUM: Magnesium: 2.2 mg/dL (ref 1.7–2.4)

## 2023-03-25 LAB — BASIC METABOLIC PANEL
Anion gap: 4 — ABNORMAL LOW (ref 5–15)
BUN: 13 mg/dL (ref 8–23)
CO2: 25 mmol/L (ref 22–32)
Calcium: 9.1 mg/dL (ref 8.9–10.3)
Chloride: 108 mmol/L (ref 98–111)
Creatinine, Ser: 0.58 mg/dL (ref 0.44–1.00)
GFR, Estimated: >60 mL/min (ref >60)
Glucose, Bld: 124 mg/dL — ABNORMAL HIGH (ref 70–99)
Potassium: 3.6 mmol/L (ref 3.5–5.1)
Sodium: 137 mmol/L (ref 135–145)

## 2023-03-25 LAB — HEPARIN LEVEL (UNFRACTIONATED): Heparin Unfractionated: 0.42 [IU]/mL (ref 0.30–0.70)

## 2023-03-25 LAB — PHOSPHORUS: Phosphorus: 2.7 mg/dL (ref 2.5–4.6)

## 2023-03-25 MED ORDER — FUROSEMIDE 10 MG/ML IJ SOLN
40.0000 mg | Freq: Once | INTRAMUSCULAR | Status: AC
  Administered 2023-03-25: 40 mg via INTRAVENOUS
  Filled 2023-03-25: qty 4

## 2023-03-25 MED ORDER — METHYLPREDNISOLONE SODIUM SUCC 125 MG IJ SOLR
81.2500 mg | Freq: Every day | INTRAMUSCULAR | Status: DC
  Administered 2023-03-25 – 2023-03-28 (×4): 81.25 mg via INTRAVENOUS
  Filled 2023-03-25 (×4): qty 2

## 2023-03-25 MED ORDER — POTASSIUM CHLORIDE 20 MEQ PO PACK
40.0000 meq | PACK | Freq: Once | ORAL | Status: AC
  Administered 2023-03-25: 40 meq
  Filled 2023-03-25: qty 2

## 2023-03-25 NOTE — Progress Notes (Signed)
[facility] ADULT ICU REPLACEMENT PROTOCOL   The patient does apply for the [facility] Adult ICU Electrolyte Replacment Protocol based on the criteria listed below:   1.Exclusion criteria: TCTS, ECMO, Dialysis, and Myasthenia Gravis patients 2. Is GFR >/= 30 ml/min? Yes.    Patient's GFR today is >60 3. Is SCr </= 2? Yes.   Patient's SCr is 0.58 mg/dL 4. Did SCr increase >/= 0.5 in 24 hours? No. 5.Pt's weight >40kg  Yes.   6. Abnormal electrolyte(s): K+ = 3.6   7. Electrolytes replaced per protocol 8.  Call MD STAT for K+ </= 2.5, Phos </= 1, or Mag </= 1 Physician:  Delia Chimes, eMD  Suzan Slick Lisa Milian 03/25/2023 6:45 AM

## 2023-03-25 NOTE — Plan of Care (Signed)
  Problem: Education: Goal: Knowledge of General Education information will improve Description: Including pain rating scale, medication(s)/side effects and non-pharmacologic comfort measures Outcome: Progressing   Problem: Clinical Measurements: Goal: Respiratory complications will improve Outcome: Progressing Goal: Cardiovascular complication will be avoided Outcome: Progressing   Problem: Activity: Goal: Risk for activity intolerance will decrease Outcome: Progressing   Problem: Nutrition: Goal: Adequate nutrition will be maintained Outcome: Progressing   Problem: Coping: Goal: Level of anxiety will decrease Outcome: Progressing   Problem: Pain Management: Goal: General experience of comfort will improve Outcome: Progressing

## 2023-03-25 NOTE — Progress Notes (Signed)
NAME:  Karen [name], MRN:  [id number], DOB:  12-04-1950, LOS: 17 ADMISSION DATE:  03/08/2023, CONSULTATION DATE:  03/08/23  REFERRING MD: MD Halivand CHIEF COMPLAINT:  AMS, hypoxia   History of Present Illness:  Pt is a 72 yr old female with a significant pmh of HTN, Hypothyroidism, Major Depressive Disorder, cognitive impairment, arthritis, hearing loss, 30-40lb unintentional weight loss since 05/2022. Per patient's husband and daughter endorse that patient has had a functional and cognitive decline since the beginning of the 2024. Patient has had an extensive workup in regards to cognitive impairment and unintentional weight loss with MRI Brain, MRCPs, CT of chest and abdomen with no definitive etiology.   Patient presented to [facility] ED with AMS and hypoxia (O2 Sats in 70s-80s) by EMS from [facility], who was scheduled for a colonoscopy on 11/20. Patient did not receive colonoscopy due to hypoxia and AMS. Initial VBG revealed pH 7.17 and pCO2 > 123. Patient was given narcan during the initial ED workup with no response and subsequently placed on BIPAP due to increasing hypoxia and respiratory distress. Repeat VBG showed no improvement and patient was intubated due to hypoxia/hypercarbia. Also, CT of chest obtained by EDP showed evidence of scattered subsegmental PE and PCCM was consulted for further vent management as well as management for PE.   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    reports that she quit smoking about 22 years ago. Her smoking use included cigarettes. She started smoking about 32 years ago. She has never used smokeless tobacco.   has a past surgical history that includes Cesarean section; Colonoscopy with propofol (N/A, 11/17/2015); Colonoscopy; and Polypectomy.    Significant Hospital Events: Including procedures, antibiotic start and stop dates in addition to other pertinent events   11/20 Admit with AMS, acute hypoxic/hypercarbic respiratory failure, subsegmental PE intubated PCCM consult EF 70%.  Elevated pulm artery pressures 53.  Right ventricle normal. No RV strain on CT.  No emphysema reported on CT. RVP NEG MRSA PCR NEG 11/21 Intubated, sedated, tremors  11/22 Febrile, tachycardic, shivering/tremors seen with physical touch. IR LP obtained LP culture neg 11/23 Neuro consulted. MRI brain ordered 11/25 TRAC ASPIRATE RARE MSSA 11/28 weaning but high respiratory rate, pressure support of 10 11/30 extubated.  Requiring BiPAP.  Transfused 1 unit packed red cells 12/2 mucus plug reintubated, bronch with therapeutic aspiration of RUL, LUL, lingula, bronchus intermedius TRACH ASPIRATE RARE MSSA 12/3 CXR improved, extubated 12/4 reintubation after further discussions with family with bronch 12/5 MRI of C-Spine-neg for spinal infection/cord compression, remains on vent.  12/6 patient following commands, on precedex, will need to readdress GOC with patient and patient family  - Intubated/Sedated but responding  GOC iPAL by Dr Chestine Spore  Interim History / Subjective:    12.7/24 : FiO2 30% on the ventilator fentanyl infusion present Precedex infusion.  Ongoing heparin infusion and tube feeds.  Is on Unasyn.  Has Foley catheter.  Is afebrile with a white count  7000.  +13.75 L  since admission. Per RN - family interested in course of steroids  Objective   Blood pressure (!) 93/52, pulse 100, temperature 99.2 F (37.3 C), temperature source Axillary, resp. rate (!) 24, height 5\' 4"  (1.626 m), weight 60.9 kg, SpO2 100%.    Vent Mode: PSV FiO2 (%):  [30 %] 30 % Set Rate:  [24 bmp] 24 bmp Vt Set:  [440 mL] 440 mL PEEP:  [8 cmH20] 8 cmH20 Pressure Support:  [14 cmH20] 14 cmH20 Plateau Pressure:  [22 cmH20-25 cmH20] 25 cmH20   Intake/Output Summary (Last 24 hours) at 03/25/2023 1115 Last data filed at 03/25/2023 2725 Gross per 24 hour  Intake 2551.17 ml  Output 1875 ml  Net 676.17 ml   Filed Weights   03/23/23 0500 03/24/23 0410 03/25/23 0500  Weight: 59.2 kg 59.6 kg 60.9 kg   Physical Exam:   General Appearance:  Looks criticall ill  Head:  Normocephalic, without obvious abnormality, atraumatic Eyes:  PERRL - yes, conjunctiva/corneas - muddy     Ears:  Normal external ear canals, both ears Nose:  G tube - no Throat:  ETT TUBE - yes , OG tube - yes Neck:  Supple,  No enlargement/tenderness/nodules Lungs: Clear to auscultation bilaterally, Ventilator   Synchrony - yes Heart:  S1 and S2 normal, no murmur, CVP - no.  Pressors - no Abdomen:  Soft, no masses, no organomegaly Genitalia / Rectal:  Not done Extremities:  Extremities- intact Skin:  ntact in exposed areas . Sacral area - not examind Neurologic:  Sedation - fent gtt, precedex gtt -> RASS - -1 . Moves all 4s - weak. CAM-ICU - cannot test . Orientation - cannot test - she did open eyes and track      Assessment & Plan:  Acute hypoxemic respiratory failure  Subsegmental PEs  Chronic ongoing aspiration events  11/20 Subsegmental Pulmonary Embolisms with LLE DVT  ECHO EF 70-75%  on 11/21 no evidence of RV heart strain  Pt extubated on 11/30, reintubated due to unable to maintain secretions/mucus plugging. Extubated once more on 12/3 and reintubated on 12/4 for aspiration of  secretions/mucus plugging  12/4 MRI C-spine neg for infectious process, or cord compression that would contribute to diaphragmatic weakness  Staph aureus growing in tracheal aspirate from 12/2   03/25/2023 - > does YES meet criteria for SBT but NOT for Extubation in setting of Acute Respiratory Failure due to MSSA VAP , weakness   P:  Continue SBT/WUA as tolerated Continue VAD/Pad protocols- continue precedex/fentanyl gtt  Continue RASS goal of 0 to -1  Continue Adequate pulmonary hygiene Continue heparin gtt   Acue PE At admit without RV strain   Plan - IV heparin gtt    MSSA VAP  12/7 - wbc normal. Afebile  Plan Anti-infectives (From admission, onward)    Start     Dose/Rate Route Frequency Ordered Stop   03/24/23 1200  Ampicillin-Sulbactam (UNASYN) 3 g in sodium chloride 0.9 % 100 mL IVPB        3 g 200 mL/hr over 30 Minutes Intravenous Every 6 hours 03/24/23 0838     03/20/23 1200  vancomycin (VANCOREADY) IVPB 1250 mg/250 mL  Status:  Discontinued        1,250 mg 166.7 mL/hr over 90 Minutes Intravenous Every 24 hours 03/20/23 0956 03/24/23 0838   03/20/23 1030  piperacillin-tazobactam (ZOSYN) IVPB 3.375 g  Status:  Discontinued        3.375 g 12.5 mL/hr over 240 Minutes Intravenous Every  8 hours 03/20/23 0956 03/24/23 0838   03/17/23 1500  vancomycin (VANCOCIN) IVPB 1000 mg/200 mL premix  Status:  Discontinued        1,000 mg 200 mL/hr over 60 Minutes Intravenous Every 24 hours 03/16/23 1347 03/20/23 0956   03/16/23 1430  vancomycin (VANCOREADY) IVPB 1500 mg/300 mL        1,500 mg 150 mL/hr over 120 Minutes Intravenous  Once 03/16/23 1337 03/16/23 1639   03/13/23 0300  cefTRIAXone (ROCEPHIN) 2 g in sodium chloride 0.9 % 100 mL IVPB        2 g 200 mL/hr over 30 Minutes Intravenous Every 24 hours 03/12/23 0902 03/14/23 2335   03/11/23 0300  vancomycin (VANCOREADY) IVPB 500 mg/100 mL  Status:  Discontinued       Placed in "Followed by" Linked Group   500 mg 100  mL/hr over 60 Minutes Intravenous Every 12 hours 03/10/23 1345 03/13/23 1103   03/10/23 1600  ampicillin (OMNIPEN) 2 g in sodium chloride 0.9 % 100 mL IVPB  Status:  Discontinued        2 g 300 mL/hr over 20 Minutes Intravenous Every 4 hours 03/10/23 1345 03/11/23 1043   03/10/23 1545  acyclovir (ZOVIRAX) 500 mg in dextrose 5 % 100 mL IVPB  Status:  Discontinued        500 mg 110 mL/hr over 60 Minutes Intravenous Every 8 hours 03/10/23 1446 03/11/23 1043   03/10/23 1500  acyclovir (ZOVIRAX) 505 mg in dextrose 5 % 100 mL IVPB  Status:  Discontinued        10 mg/kg  50.7 kg 110.1 mL/hr over 60 Minutes Intravenous Every 8 hours 03/10/23 1345 03/10/23 1446   03/10/23 1500  vancomycin (VANCOCIN) IVPB 1000 mg/200 mL premix       Placed in "Followed by" Linked Group   1,000 mg 200 mL/hr over 60 Minutes Intravenous  Once 03/10/23 1345 03/10/23 1535   03/10/23 1445  cefTRIAXone (ROCEPHIN) 2 g in sodium chloride 0.9 % 100 mL IVPB  Status:  Discontinued        2 g 200 mL/hr over 30 Minutes Intravenous Every 12 hours 03/10/23 1345 03/12/23 0902         Fluid Overload - Received lasix 40mg  x 2 with albumin on 12/4   12/7 - +13.75L positive  Plan  - lasix 40mg  IV  x 1    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  secondary to neurocognitive disorder (dementia) vs chronic aspiration  Extensive workup outpatient with unrevealing work up to date   ~80lb wt loss since beginning of 2024   P:  Continue tube feeding  Continue Thiamine IV daily  Continue tube feeding with advancement per RDs recs  Continue Thiamine IV daily  Check AchRAB  Anemia of Critical Illness - onset 11/21/2/24  12/7 -slowly worse but no bleeding and still > 7  Plan -- PRBC for hgb </= 6.9gm%    - exceptions are   -  if ACS susepcted/confirmed then transfuse for hgb </= 8.0gm%,  or    -  active bleeding with hemodynamic instability, then transfuse regardless of hemoglobin value   At at all times try to transfuse 1 unit  prbc as possible with exception of active hemorrhage    Hypoglycemia CBGs improving with tube feeds  P: CBG q 4 Continue tube feeds   HTN Episodes of hypotension related to sedation   P:  Continue to hold antihypertensives  Wean sedation as tolerated Wean levophed  HLD  P:  Continue rosuvastatin per tube   Hypothyroidism P:  Continue levothyroxine per tube   Anxiety  Depression  Dementia P: Continue aricept    Best Practice (right click and "Reselect all SmartList Selections" daily)   Diet/type: TUBE FEEDS DVT prophylaxis: systemic heparin for now Pressure ulcer(s). None present GI prophylaxis: PPI Lines: N/A Foley:  Yes, and it is still needed Code Status:  full code Last date of multidisciplinary goals of care discussion:   =  will update family 12/5 at [facility] meeting scheduled at 1000, done 12/6  - trach idea introduced - 12/7 - called Sarha Sasso spouse [phone]: updated        ATTESTATION & SIGNATURE   The patient Mykala Frieden is critically ill with multiple organ systems failure and requires high complexity decision making for assessment and support, frequent evaluation and titration of therapies, application of advanced monitoring technologies and extensive interpretation of multiple databases and discussion with other appropriate health care personnel such as bedside nurses, social workers, case Production designer, theatre/television/film, consultants, respiratory therapists, nutritionists, secretaries etc.,  Critical care time includes but is not restricted to just documentation time. Documentation can happen in parallel or sequential to care time depending on case mix urgency and priorities for the shift. So, overall critical Care Time devoted to patient care services described in this note is  35  Minutes.   This time reflects time of care of this signee Dr Kalman Shan which includ does not reflect procedure time, or teaching time or supervisory time of PA/NP/Med  student/Med Resident etc but could involve care discussion time     Dr. Kalman Shan, M.D., [facility].C.P Pulmonary and Critical Care Medicine Staff Physician, [facility] [place] [facility] Pager: [phone], If no answer or between  15:00h - 7:00h: call [phone]  03/25/2023 11:16 AM    LABS    PULMONARY Recent Labs  Lab 03/19/23 0422 03/20/23 0203 03/20/23 1054 03/21/23 1548 03/22/23 1258  PHART 7.39 7.44 7.58* 7.31* 7.58*  PCO2ART 60* 59* 43 74* 38  PO2ART 51* 82* 368* 79* 89  HCO3 36.3* 40.1* 40.3* 37.3* 35.3*  O2SAT 91.2 98.3 99.6 98.7 99.6    CBC Recent Labs  Lab 03/23/23 0435 03/24/23 0815 03/25/23 0500  HGB 8.0* 7.7* 7.6*  HCT 26.6* 24.8* 25.4*  WBC 12.5* 10.7* 7.0  PLT 566* 665* 544*    COAGULATION No results for input(s): "INR" in the last 168 hours.  CARDIAC  No results for input(s): "TROPONINI" in the last 168 hours. No results for input(s): "PROBNP" in the last 168 hours.   CHEMISTRY Recent Labs  Lab 03/21/23 1750 03/21/23 2026 03/22/23 0509 03/23/23 0435 03/23/23 1629 03/24/23 0501 03/24/23 1811 03/25/23 0500  NA 136  --  143 142  --  133*  --  137  K 6.0*  --  3.8 3.8  --  2.9*  --  3.6  CL 98  --  104 100  --  98  --  108  CO2 32  --  32 32  --  27  --  25  GLUCOSE 163*  --  86 87  --  128*  --  124*  BUN 22  --  21 17  --  17  --  13  CREATININE 0.72  --  0.56 0.56  --  0.75  --  0.58  CALCIUM 9.1  --  9.1 9.2  --  8.6*  --  9.1  MG  --   --  2.2 1.8 1.6* 2.5* 2.2 2.2  PHOS  --    < > 4.1 3.4 1.6* 3.8 2.6 2.7   < > = values in this interval not displayed.   Estimated Creatinine Clearance: 56.5 mL/min (by C-G formula based on SCr of 0.58 mg/dL).   LIVER No results for input(s): "AST", "ALT", "ALKPHOS", "BILITOT", "PROT", "ALBUMIN", "INR" in the last 168 hours.   INFECTIOUS Recent Labs  Lab 03/20/23 1245 03/23/23 0943  PROCALCITON 0.42 0.32     ENDOCRINE CBG (last 3)  Recent Labs     03/24/23 2341 03/25/23 0354 03/25/23 0807  GLUCAP 106* 120* 111*         IMAGING x48h  - image(s) personally visualized  -   highlighted in bold No results found.

## 2023-03-25 NOTE — Progress Notes (Signed)
PHARMACY - ANTICOAGULATION CONSULT NOTE  Pharmacy Consult for heparin Indication: acute pulmonary embolus  Allergies  Allergen Reactions   Zoloft [Sertraline Hcl] Anxiety and Other (See Comments)    "crazy feeling"    Patient Measurements: Height: 5\' 4"  (162.6 cm) Weight: 60.9 kg (134 lb 4.2 oz) IBW/kg (Calculated) : 54.7 Heparin Dosing Weight: TBW  Vital Signs: Temp: 99.1 F (37.3 C) (12/07 0400) Temp Source: Axillary (12/07 0400) BP: 128/70 (12/07 0500) Pulse Rate: 79 (12/07 0500)  Labs: Recent Labs    03/23/23 0435 03/23/23 0549 03/24/23 0501 03/24/23 0815 03/25/23 0500  HGB 8.0*  --   --  7.7* 7.6*  HCT 26.6*  --   --  24.8* 25.4*  PLT 566*  --   --  665* 544*  HEPARINUNFRC >1.10* 0.49 0.39  --  0.42  CREATININE 0.56  --  0.75  --   --     Estimated Creatinine Clearance: 56.5 mL/min (by C-G formula based on SCr of 0.75 mg/dL).   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 Three variants of uncertain significance were detected - one in the AXIN2 gene called c.1829G>A, one in the PMS2 gene called c.964G>A, and one in the RNF43 gene called c.1114C>T. The report date is 06/24/2019.     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 She was transferred to the [facility] ED where CTA chest revealed scattered subsegmental pulmonary emboli throughout the right lung, no RHS. She was intubated in the ED due to lack of improvement on BiPAP. No anticoagulation noted PTA. Pharmacy consulted for heparin dosing.   Significant events: -11/21: LE Doppler: Findings consistent with age indeterminate DVT involving the left peroneal veins.  -11/22: lumbar puncture by IR  -11/30: extubated, 12/2 reintubated, bronch - 12/3 extubated, 12/4 re-intubated  Today, 03/25/2023: - Heparin level 0.42, remains therapeutic on 1200 units/hr - CBC: Hgb low/decreased to 7.6 (last transfused 11/30), Plt remain elevated - No complications of therapy noted, No bleeding reported by RN.   Goal of Therapy:  Heparin level 0.3-0.7 units/ml Monitor platelets by anticoagulation protocol: Yes   Plan:  -Continue heparin infusion at 1200 units/hr -Daily CBC, heparin level -Monitor closely for s/sx of bleeding  Lynann Beaver PharmD, BCPS [facility] main pharmacy [phone] 03/25/2023 6:27 AM

## 2023-03-26 ENCOUNTER — Inpatient Hospital Stay (HOSPITAL_COMMUNITY): Payer: Medicare HMO

## 2023-03-26 DIAGNOSIS — J9601 Acute respiratory failure with hypoxia: Secondary | ICD-10-CM | POA: Diagnosis not present

## 2023-03-26 LAB — GLUCOSE, CAPILLARY
Glucose-Capillary: 113 mg/dL — ABNORMAL HIGH (ref 70–99)
Glucose-Capillary: 95 mg/dL (ref 70–99)
Glucose-Capillary: 115 mg/dL — ABNORMAL HIGH (ref 70–99)
Glucose-Capillary: 150 mg/dL — ABNORMAL HIGH (ref 70–99)
Glucose-Capillary: 135 mg/dL — ABNORMAL HIGH (ref 70–99)
Glucose-Capillary: 104 mg/dL — ABNORMAL HIGH (ref 70–99)

## 2023-03-26 LAB — CBC
HCT: 26.5 % — ABNORMAL LOW (ref 36.0–46.0)
Hemoglobin: 8.1 g/dL — ABNORMAL LOW (ref 12.0–15.0)
MCH: 29.8 pg (ref 26.0–34.0)
MCHC: 30.6 g/dL (ref 30.0–36.0)
MCV: 97.4 fL (ref 80.0–100.0)
Platelets: 556 10*3/uL — ABNORMAL HIGH (ref 150–400)
RBC: 2.72 MIL/uL — ABNORMAL LOW (ref 3.87–5.11)
RDW: 15.2 % (ref 11.5–15.5)
WBC: 9.9 10*3/uL (ref 4.0–10.5)
nRBC: 0 % (ref 0.0–0.2)

## 2023-03-26 LAB — COMPREHENSIVE METABOLIC PANEL
ALT: 25 U/L (ref 0–44)
AST: 25 U/L (ref 15–41)
Albumin: 2.4 g/dL — ABNORMAL LOW (ref 3.5–5.0)
Alkaline Phosphatase: 81 U/L (ref 38–126)
Anion gap: 8 (ref 5–15)
BUN: 16 mg/dL (ref 8–23)
CO2: 24 mmol/L (ref 22–32)
Calcium: 9.8 mg/dL (ref 8.9–10.3)
Chloride: 106 mmol/L (ref 98–111)
Creatinine, Ser: 0.49 mg/dL (ref 0.44–1.00)
GFR, Estimated: >60 mL/min (ref >60)
Glucose, Bld: 121 mg/dL — ABNORMAL HIGH (ref 70–99)
Potassium: 3.6 mmol/L (ref 3.5–5.1)
Sodium: 138 mmol/L (ref 135–145)
Total Bilirubin: 0.5 mg/dL (ref <1.2)
Total Protein: 6.4 g/dL — ABNORMAL LOW (ref 6.5–8.1)

## 2023-03-26 LAB — HEPARIN LEVEL (UNFRACTIONATED): Heparin Unfractionated: 0.48 [IU]/mL (ref 0.30–0.70)

## 2023-03-26 LAB — PHOSPHORUS: Phosphorus: 2.5 mg/dL (ref 2.5–4.6)

## 2023-03-26 MED ORDER — POTASSIUM CHLORIDE 20 MEQ PO PACK
40.0000 meq | PACK | Freq: Once | ORAL | Status: AC
  Administered 2023-03-26: 40 meq
  Filled 2023-03-26: qty 2

## 2023-03-26 MED ORDER — POTASSIUM CHLORIDE 20 MEQ PO PACK
40.0000 meq | PACK | Freq: Two times a day (BID) | ORAL | Status: DC
  Administered 2023-03-26 – 2023-03-31 (×11): 40 meq
  Filled 2023-03-26 (×14): qty 2

## 2023-03-26 MED ORDER — FUROSEMIDE 10 MG/ML IJ SOLN
60.0000 mg | Freq: Four times a day (QID) | INTRAMUSCULAR | Status: DC
Start: 2023-03-26 — End: 2023-03-28
  Administered 2023-03-26 – 2023-03-28 (×8): 60 mg via INTRAVENOUS
  Filled 2023-03-26 (×8): qty 6

## 2023-03-26 NOTE — Progress Notes (Signed)
eLink Physician-Brief Progress Note Patient Name: Karen [name] DOB: 08-08-1950 MRN: [id number]   Date of Service  03/26/2023  HPI/Events of Note  Pt intermittently restless, dyssycnhronous with the ventilator.  Already on max dose precedex and fentanyl.   eICU Interventions  Increased ceiling dose of fentanyl drip to 383mcg/hr.  Will continue to monitor closely.         Faithlynn Deeley M DELA CRUZ 03/26/2023, 12:59 AM

## 2023-03-26 NOTE — Progress Notes (Signed)
[facility] ADULT ICU REPLACEMENT PROTOCOL   The patient does apply for the [facility] Adult ICU Electrolyte Replacment Protocol based on the criteria listed below:   1.Exclusion criteria: TCTS, ECMO, Dialysis, and Myasthenia Gravis patients 2. Is GFR >/= 30 ml/min? Yes.    Patient's GFR today is >60 3. Is SCr </= 2? Yes.   Patient's SCr is 0.49 mg/dL 4. Did SCr increase >/= 0.5 in 24 hours? No. 5.Pt's weight >40kg  Yes.   6. Abnormal electrolyte(s): K+3.6  7. Electrolytes replaced per protocol 8.  Call MD STAT for K+ </= 2.5, Phos </= 1, or Mag </= 1 Physician:  Dr. Vladimir Faster  Lolita Lenz 03/26/2023 4:44 AM

## 2023-03-26 NOTE — Progress Notes (Signed)
NAME:  Karen [name], MRN:  [id number], DOB:  1950/11/03, LOS: 18 ADMISSION DATE:  03/08/2023, CONSULTATION DATE:  03/08/23  REFERRING MD: MD Halivand CHIEF COMPLAINT:  AMS, hypoxia   History of Present Illness:  Pt is a 72 yr old female with a significant pmh of HTN, Hypothyroidism, Major Depressive Disorder, cognitive impairment, arthritis, hearing loss, 30-40lb unintentional weight loss since 05/2022. Per patient's husband and daughter endorse that patient has had a functional and cognitive decline since the beginning of the 2024. Patient has had an extensive workup in regards to cognitive impairment and unintentional weight loss with MRI Brain, MRCPs, CT of chest and abdomen with no definitive etiology.   Patient presented to [facility] ED with AMS and hypoxia (O2 Sats in 70s-80s) by EMS from [facility], who was scheduled for a colonoscopy on 11/20. Patient did not receive colonoscopy due to hypoxia and AMS. Initial VBG revealed pH 7.17 and pCO2 > 123. Patient was given narcan during the initial ED workup with no response and subsequently placed on BIPAP due to increasing hypoxia and respiratory distress. Repeat VBG showed no improvement and patient was intubated due to hypoxia/hypercarbia. Also, CT of chest obtained by EDP showed evidence of scattered subsegmental PE and PCCM was consulted for further vent management as well as management for PE.   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    reports that she quit smoking about 22 years ago. Her smoking use included cigarettes. She started smoking about 32 years ago. She has never used smokeless tobacco.   has a past surgical history that includes Cesarean section; Colonoscopy with propofol (N/A, 11/17/2015); Colonoscopy; and Polypectomy.    Significant Hospital Events: Including procedures, antibiotic start and stop dates in addition to other pertinent events   11/20 Admit with AMS, acute hypoxic/hypercarbic respiratory failure, subsegmental PE intubated PCCM consult EF 70%.  Elevated pulm artery pressures 53.  Right ventricle normal. No RV strain on CT.  No emphysema reported on CT. RVP NEG MRSA PCR NEG 11/21 Intubated, sedated, tremors  11/22 Febrile, tachycardic, shivering/tremors seen with physical touch. IR LP obtained LP culture neg 11/23 Neuro consulted. MRI brain ordered 11/25 TRAC ASPIRATE RARE MSSA 11/28 weaning but high respiratory rate, pressure support of 10 11/30 extubated.  Requiring BiPAP.  Transfused 1 unit packed red cells 12/2 mucus plug reintubated, bronch with therapeutic aspiration of RUL, LUL, lingula, bronchus intermedius TRACH ASPIRATE RARE MSSA 12/3 CXR improved, extubated 12/4 reintubation after further discussions with family with bronch 12/5 MRI of C-Spine-neg for spinal infection/cord compression, remains on vent.  12/6 patient following commands, on precedex, will need to readdress GOC with patient and patient family  - Intubated/Sedated but responding  GOC iPAL by Dr Chestine Spore 12.7/24 : FiO2 30% on the ventilator fentanyl infusion present Precedex infusion.  Ongoing heparin infusion and tube feeds.  Is on Unasyn.  Has Foley catheter.  Is afebrile with a white count 7000.  +13.75 L since admission. Per RN -  family interested in  course of steroids  Interim History / Subjective:   03/26/23: Overnight required increased fentanyl.  FiO2 30%.  On fentanyl infusion and Precedex infusion on heparin infusion.  Has Foley catheter.  Afebrile normal white count. XR today -NG tube request to be advanced.  There is evidence of pulmonary congestion versus atelectasis. Responded t lasix x 1 -> but still +13.8L positive  Objective   Blood pressure (!) 150/82, pulse 93, temperature 99.7 F (37.6 C), temperature source Axillary, resp. rate (!) 26, height 5\' 4"  (1.626 m), weight 59.4 kg, SpO2 99%.    Vent Mode: PSV FiO2 (%):  [30 %] 30 % Set Rate:  [24 bmp] 24 bmp Vt Set:  [440 mL] 440 mL PEEP:  [8 cmH20] 8 cmH20 Pressure Support:  [14 cmH20-15 cmH20] 15 cmH20 Plateau Pressure:  [25 cmH20-30 cmH20] 26 cmH20   Intake/Output Summary (Last 24 hours) at 03/26/2023 1000 Last data filed at 03/26/2023 0720 Gross per 24 hour  Intake 2552.16 ml  Output 2885 ml  Net -332.84 ml   Filed Weights   03/24/23 0410 03/25/23 0500 03/26/23 0438  Weight: 59.6 kg 60.9 kg 59.4 kg   Physical Exam:   General Appearance:  Looks criticall ill  Head:  Normocephalic, without obvious abnormality, atraumatic Eyes:  PERRL - yes, conjunctiva/corneas - muddy     Ears:  Normal external ear canals, both ears Nose:  G tube - no Throat:  ETT TUBE - yes , OG tube - yes Neck:  Supple,  No enlargement/tenderness/nodules Lungs: Clear to auscultation bilaterally, Ventilator   Synchrony - yes Heart:  S1 and S2 normal, no murmur, CVP - no.  Pressors - no Abdomen:  Soft, no masses, no organomegaly Genitalia / Rectal:  Not done Extremities:  Extremities- intact Skin:  ntact in exposed areas . Sacral area - not examind Neurologic:  Sedation - fent gtt, precedex gtt -> RASS - -1 . Moves all 4s - weak. CAM-ICU - cannot test . Orientation - cannot test - she did open eyes and track   General Appearance:  Looks criticall ill . More  awaek Head:  Normocephalic, without obvious abnormality, atraumatic Eyes:  PERRL - yes, conjunctiva/corneas - muddy     Ears:  Normal external ear canals, both ears Nose:  G tube - no Throat:  ETT TUBE - YES , OG tube - YEs Neck:  Supple,  No enlargement/tenderness/nodules Lungs: Clear to auscultation bilaterally, Ventilator   Synchrony - yes on PS/CPAP right now with 8 peep Heart:  S1 and S2 normal, no murmur, CVP - no.  Pressors - no Abdomen:  Soft, no masses, no organomegaly Genitalia / Rectal:  Not done Extremities:  Extremities- intact with dedma Skin:  ntact in exposed areas . Sacral area - not examined Neurologic:  Sedation - fent gtt, precedex gtt -> RASS - -1 . Moves all 4s - weak. CAM-ICU - cannnot test . Orientation - nodded      Assessment & Plan:  Acute hypoxemic respiratory failure  Subsegmental PEs  Chronic ongoing aspiration events  11/20 Subsegmental Pulmonary Embolisms with LLE DVT  ECHO EF 70-75%  on 11/21 no evidence of RV heart strain  Pt extubated on 11/30, reintubated due to unable to maintain secretions/mucus plugging. Extubated once more on 12/3 and reintubated on 12/4 for aspiration of secretions/mucus plugging  12/4 MRI C-spine neg for infectious process, or cord compression that would contribute to diaphragmatic weakness  Staph aureus growing in tracheal aspirate from 12/2   03/26/2023 - >  does YES meet criteria for SBT but NOT for Extubation in setting of Acute Respiratory Failure due to MSSA VAP , weakness. Dong SBT   P:  Continue SBT/WUA as tolerated -> NO EXTUBAION - husban informed of likely need for trach - he seems aligned Continue VAD/Pad protocols- continue precedex/fentanyl gtt  Continue RASS goal of 0 to -1  Continue Adequate pulmonary hygiene Continue heparin gtt   Acue PE At admit without RV strain   Plan - IV heparin gtt    MSSA VAP  12/8 - wbc normal. Afebile  Plan Unasyn 03/24/2023>> Vancomycin 03/10/2023 - 03/10/2023,  03/16/2023 - 03/16/2023, 03/20/2023 - 03/24/2023 - Zosyn 03/20/2023 - 03/24/2023 ceftriaxone 03/12/2023 - 03/14/2023 Acyclovir 03/10/2023 - 03/11/2023      Fluid Overload - Received lasix 40mg  x 2 with albumin on 12/4   12/8 - +13.75L positive  Plan  - lasix 60 mg every 8 hours to start 03/26/2023    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  secondary to neurocognitive disorder (dementia) vs chronic aspiration  Extensive workup outpatient with unrevealing work up to date   ~80lb wt loss since beginning of 2024   03/26/2023: OG tube is too high  P:  Continue tube feeding  -after advancing OG tube Continue Thiamine IV daily  Continue tube feeding with advancement per RDs recs  Continue Thiamine IV daily  Check AchRAB  -done 03/26/2023  Anemia of Critical Illness - onset 11/21/2/24  03/26/2023: Hemoglobin 8 and no bleeding  Plan -- PRBC for hgb </= 6.9gm%    - exceptions are   -  if ACS susepcted/confirmed then transfuse for hgb </= 8.0gm%,  or    -  active bleeding with hemodynamic instability, then transfuse regardless of hemoglobin value   At at all times try to transfuse 1 unit prbc as possible with exception of active hemorrhage    Hypoglycemia CBGs improving with tube feeds  P: CBG q 4 Continue tube feeds   HTN Episodes of hypotension related to sedation   P:  Continue to hold antihypertensives    HLD  P:  Continue rosuvastatin per tube   Hypothyroidism P:  Continue levothyroxine per tube   Anxiety  Depression  Dementia P: Continue aricept    Best Practice (right click and "Reselect all SmartList Selections" daily)   Diet/type: TUBE FEEDS DVT prophylaxis: systemic heparin for now Pressure ulcer(s). None present GI prophylaxis: PPI Lines: N/A Foley:  Yes, and it is still needed Code Status:  full code Last date of multidisciplinary goals of care discussion:   =  will update family 12/5 at [facility] meeting scheduled at 1000, done 12/6  - trach idea  introduced - 12/7 - called Don Ruschak spouse [phone]: updated -03/26/2023: Husband updated at the bedside with nurse present         ATTESTATION & SIGNATURE   The patient Karen [name] is critically ill with multiple organ systems failure and requires high complexity decision making for assessment and support, frequent evaluation and titration of therapies, application of advanced monitoring technologies and extensive interpretation of multiple databases and discussion with other appropriate health care personnel such as bedside nurses, social workers, case Production designer, theatre/television/film, consultants, respiratory therapists, nutritionists, secretaries etc.,  Critical care time includes but is not restricted to just documentation time. Documentation can happen in parallel or sequential to care time depending on case mix urgency and priorities for the shift. So, overall critical Care Time devoted to patient care services described in this note is  35  Minutes.   This time reflects time of care of this signee Dr Kalman Shan which includ does not reflect procedure time, or teaching time or supervisory time of PA/NP/Med student/Med Resident etc but could involve care discussion time     Dr. Kalman Shan, M.D., [facility]C.P Pulmonary and Critical Care Medicine Staff Physician, [facility] Pager: [phone], If no answer or between  15:00h - 7:00h: call [phone]  03/26/2023 10:20 AM     LABS    PULMONARY Recent Labs  Lab 03/20/23 0203 03/20/23 1054 03/21/23 1548 03/22/23 1258  PHART 7.44 7.58* 7.31* 7.58*  PCO2ART 59* 43 74* 38  PO2ART 82* 368* 79* 89  HCO3 40.1* 40.3* 37.3* 35.3*  O2SAT 98.3 99.6 98.7 99.6    CBC Recent Labs  Lab 03/24/23 0815 03/25/23 0500 03/26/23 0344  HGB 7.7* 7.6* 8.1*  HCT 24.8* 25.4* 26.5*  WBC 10.7* 7.0 9.9  PLT 665* 544* 556*    COAGULATION No results for input(s): "INR" in the last 168  hours.  CARDIAC  No results for input(s): "TROPONINI" in the last 168 hours. No results for input(s): "PROBNP" in the last 168 hours.   CHEMISTRY Recent Labs  Lab 03/22/23 0509 03/23/23 0435 03/23/23 1629 03/24/23 0501 03/24/23 1811 03/25/23 0500 03/26/23 0344  NA 143 142  --  133*  --  137 138  K 3.8 3.8  --  2.9*  --  3.6 3.6  CL 104 100  --  98  --  108 106  CO2 32 32  --  27  --  25 24  GLUCOSE 86 87  --  128*  --  124* 121*  BUN 21 17  --  17  --  13 16  CREATININE 0.56 0.56  --  0.75  --  0.58 0.49  CALCIUM 9.1 9.2  --  8.6*  --  9.1 9.8  MG 2.2 1.8 1.6* 2.5* 2.2 2.2  --   PHOS 4.1 3.4 1.6* 3.8 2.6 2.7 2.5   Estimated Creatinine Clearance: 56.5 mL/min (by C-G formula based on SCr of 0.49 mg/dL).   LIVER Recent Labs  Lab 03/26/23 0344  AST 25  ALT 25  ALKPHOS 81  BILITOT 0.5  PROT 6.4*  ALBUMIN 2.4*     INFECTIOUS Recent Labs  Lab 03/20/23 1245 03/23/23 0943  PROCALCITON 0.42 0.32     ENDOCRINE CBG (last 3)  Recent Labs    03/25/23 2312 03/26/23 0429 03/26/23 0813  GLUCAP 125* 113* 95         IMAGING x48h  - image(s) personally visualized  -   highlighted in bold DG Abd 1 View  Result Date: 03/25/2023 CLINICAL DATA:  OG tube placement EXAM: ABDOMEN - 1 VIEW COMPARISON:  03/22/2023 FINDINGS: OG tube tip in the stomach.  Side port near the GE junction. IMPRESSION: OG tube tip in the proximal stomach. This could be advanced several cm for optimal positioning. Electronically Signed   By: Charlett Nose M.D.   On: 03/25/2023 19:49

## 2023-03-26 NOTE — Progress Notes (Signed)
PHARMACY - ANTICOAGULATION CONSULT NOTE  Pharmacy Consult for heparin Indication: acute pulmonary embolus  Allergies  Allergen Reactions   Zoloft [Sertraline Hcl] Anxiety and Other (See Comments)    "crazy feeling"    Patient Measurements: Height: 5\' 4"  (162.6 cm) Weight: 59.4 kg (130 lb 15.3 oz) IBW/kg (Calculated) : 54.7 Heparin Dosing Weight: TBW  Vital Signs: Temp: 97.4 F (36.3 C) (12/08 0400) Temp Source: Axillary (12/08 0400) BP: 92/55 (12/08 0400)  Labs: Recent Labs    03/24/23 0501 03/24/23 0815 03/24/23 0815 03/25/23 0500 03/26/23 0344  HGB  --  7.7*   < > 7.6* 8.1*  HCT  --  24.8*  --  25.4* 26.5*  PLT  --  665*  --  544* 556*  HEPARINUNFRC 0.39  --   --  0.42 0.48  CREATININE 0.75  --   --  0.58 0.49   < > = values in this interval not displayed.    Estimated Creatinine Clearance: 56.5 mL/min (by C-G formula based on SCr of 0.49 mg/dL).   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 Three variants of uncertain significance were detected - one in the AXIN2 gene called c.1829G>A, one in the PMS2 gene called c.964G>A, and one in the RNF43 gene called c.1114C>T. The report date is 06/24/2019.     The Multi-Cancer Panel offered by [facility] includes sequencing and/or deletion duplication test   GERD (gastroesophageal reflux disease)    History of colonic polyps    Hyperlipidemia    Hypertension    Hypothyroidism    Major  depressive disorder    Osteoarthritis of knee 07/20/2021   Subjective tinnitus of both ears 03/26/2019   Tick bite 08/2015   UTI (urinary tract infection) 10/2015   Assessment: 72 YO female presenting for a colonoscopy procedure 11/20 with SpO2 70-80% and slight shortness of breath. She was transferred to the [facility] ED where CTA chest revealed scattered subsegmental pulmonary emboli throughout the right lung, no RHS. She was intubated in the ED due to lack of improvement on BiPAP. No anticoagulation noted PTA. Pharmacy consulted for heparin dosing.   Significant events: -11/21: LE Doppler: Findings consistent with age indeterminate DVT involving the left peroneal veins.  -11/22: lumbar puncture by IR  -11/30: extubated, 12/2 reintubated, bronch - 12/3 extubated, 12/4 re-intubated  Today, 03/26/2023: - Heparin level 0.48, remains therapeutic on 1200 units/hr - CBC: Hgb low/improved to 8.1, Plt remain elevated - No complications of therapy noted, No bleeding reported by RN.   Goal of Therapy:  Heparin level 0.3-0.7 units/ml Monitor platelets by anticoagulation protocol: Yes   Plan:  -Continue heparin infusion at 1200 units/hr -Daily CBC, heparin level -Monitor closely for s/sx of bleeding  Lynann Beaver PharmD, BCPS [facility] main pharmacy [phone] 03/26/2023 6:24 AM

## 2023-03-26 NOTE — Progress Notes (Signed)
eLink Physician-Brief Progress Note Patient Name: Karen [name] DOB: 1951-03-21 MRN: [id number]   Date of Service  03/26/2023  HPI/Events of Note  Received request to renew bilateral wrist restraints. Pt remains intubated.   eICU Interventions  Wrist restraint order renewed.      Intervention Category Minor Interventions: Other:  Larinda Buttery 03/26/2023, 11:07 PM

## 2023-03-26 NOTE — Plan of Care (Signed)
  Problem: Clinical Measurements: Goal: Will remain free from infection Outcome: Progressing Goal: Respiratory complications will improve Outcome: Progressing Goal: Cardiovascular complication will be avoided Outcome: Progressing   Problem: Nutrition: Goal: Adequate nutrition will be maintained Outcome: Progressing   Problem: Elimination: Goal: Will not experience complications related to urinary retention Outcome: Progressing   Problem: Pain Management: Goal: General experience of comfort will improve Outcome: Progressing

## 2023-03-26 NOTE — Plan of Care (Signed)
?  Problem: Clinical Measurements: ?Goal: Ability to maintain clinical measurements within normal limits will improve ?Outcome: Progressing ?Goal: Will remain free from infection ?Outcome: Progressing ?Goal: Diagnostic test results will improve ?Outcome: Progressing ?  ?

## 2023-03-26 NOTE — Progress Notes (Signed)
   PM camera rounds  - looks stable     SIGNATURE    Dr. Kalman Shan, M.D., F.C.C.P,  Pulmonary and Critical Care Medicine Staff Physician, [facility] Director - Interstitial Lung Disease  Program  Pulmonary Fibrosis [facility] [place], [place], [address]   Pager: [phone], If no answer  -> Check AMION or Try [phone] Telephone (clinical office): [phone] Telephone (research): [phone]  6:37 PM 03/26/2023

## 2023-03-27 DIAGNOSIS — J9621 Acute and chronic respiratory failure with hypoxia: Secondary | ICD-10-CM | POA: Diagnosis not present

## 2023-03-27 DIAGNOSIS — I2699 Other pulmonary embolism without acute cor pulmonale: Secondary | ICD-10-CM | POA: Diagnosis not present

## 2023-03-27 LAB — COMPREHENSIVE METABOLIC PANEL
ALT: 26 U/L (ref 0–44)
AST: 29 U/L (ref 15–41)
Albumin: 2.4 g/dL — ABNORMAL LOW (ref 3.5–5.0)
Alkaline Phosphatase: 72 U/L (ref 38–126)
Anion gap: 9 (ref 5–15)
BUN: 20 mg/dL (ref 8–23)
CO2: 26 mmol/L (ref 22–32)
Calcium: 9.7 mg/dL (ref 8.9–10.3)
Chloride: 102 mmol/L (ref 98–111)
Creatinine, Ser: 0.59 mg/dL (ref 0.44–1.00)
GFR, Estimated: >60 mL/min (ref >60)
Glucose, Bld: 126 mg/dL — ABNORMAL HIGH (ref 70–99)
Potassium: 3.7 mmol/L (ref 3.5–5.1)
Sodium: 137 mmol/L (ref 135–145)
Total Bilirubin: 0.4 mg/dL (ref <1.2)
Total Protein: 6.5 g/dL (ref 6.5–8.1)

## 2023-03-27 LAB — GLUCOSE, CAPILLARY
Glucose-Capillary: 116 mg/dL — ABNORMAL HIGH (ref 70–99)
Glucose-Capillary: 117 mg/dL — ABNORMAL HIGH (ref 70–99)
Glucose-Capillary: 125 mg/dL — ABNORMAL HIGH (ref 70–99)
Glucose-Capillary: 143 mg/dL — ABNORMAL HIGH (ref 70–99)
Glucose-Capillary: 148 mg/dL — ABNORMAL HIGH (ref 70–99)
Glucose-Capillary: 159 mg/dL — ABNORMAL HIGH (ref 70–99)

## 2023-03-27 LAB — CBC
HCT: 25.8 % — ABNORMAL LOW (ref 36.0–46.0)
Hemoglobin: 8 g/dL — ABNORMAL LOW (ref 12.0–15.0)
MCH: 30.4 pg (ref 26.0–34.0)
MCHC: 31 g/dL (ref 30.0–36.0)
MCV: 98.1 fL (ref 80.0–100.0)
Platelets: 570 10*3/uL — ABNORMAL HIGH (ref 150–400)
RBC: 2.63 MIL/uL — ABNORMAL LOW (ref 3.87–5.11)
RDW: 15.2 % (ref 11.5–15.5)
WBC: 12.9 10*3/uL — ABNORMAL HIGH (ref 4.0–10.5)
nRBC: 0 % (ref 0.0–0.2)

## 2023-03-27 LAB — MAGNESIUM: Magnesium: 1.8 mg/dL (ref 1.7–2.4)

## 2023-03-27 LAB — HEPARIN LEVEL (UNFRACTIONATED): Heparin Unfractionated: 0.64 [IU]/mL (ref 0.30–0.70)

## 2023-03-27 LAB — PHOSPHORUS: Phosphorus: 2.4 mg/dL — ABNORMAL LOW (ref 2.5–4.6)

## 2023-03-27 LAB — ACETYLCHOLINE RECEPTOR, BINDING: Acety choline binding ab: 0.03 nmol/L (ref 0.00–0.24)

## 2023-03-27 LAB — COPPER, SERUM: Copper: 76 ug/dL — ABNORMAL LOW (ref 80–158)

## 2023-03-27 NOTE — Progress Notes (Signed)
PHARMACY - ANTICOAGULATION CONSULT NOTE  Pharmacy Consult for heparin Indication: acute pulmonary embolus  Allergies  Allergen Reactions   Zoloft [Sertraline Hcl] Anxiety and Other (See Comments)    "crazy feeling"    Patient Measurements: Height: 5\' 4"  (162.6 cm) Weight: 58.2 kg (128 lb 4.9 oz) IBW/kg (Calculated) : 54.7 Heparin Dosing Weight: TBW  Vital Signs: Temp: 99.4 F (37.4 C) (12/09 0400) Temp Source: Axillary (12/09 0400) BP: 119/69 (12/09 0446)  Labs: Recent Labs    03/25/23 0500 03/26/23 0344 03/27/23 0411 03/27/23 0510  HGB 7.6* 8.1* 8.0*  --   HCT 25.4* 26.5* 25.8*  --   PLT 544* 556* 570*  --   HEPARINUNFRC 0.42 0.48  --  0.64  CREATININE 0.58 0.49 0.59  --     Estimated Creatinine Clearance: 56.5 mL/min (by C-G formula based on SCr of 0.59 mg/dL).   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 Three variants of uncertain significance were detected - one in the AXIN2 gene called c.1829G>A, one in the PMS2 gene called c.964G>A, and one in the RNF43 gene called c.1114C>T. The report date is 06/24/2019.     The Multi-Cancer Panel offered by [facility] includes sequencing and/or deletion duplication test   GERD (gastroesophageal reflux disease)    History of colonic polyps    Hyperlipidemia    Hypertension    Hypothyroidism    Major depressive disorder    Osteoarthritis of knee 07/20/2021   Subjective tinnitus of both ears  03/26/2019   Tick bite 08/2015   UTI (urinary tract infection) 10/2015   Assessment: 72 YO female presenting for a colonoscopy procedure 11/20 with SpO2 70-80% and slight shortness of breath. She was transferred to [facility] ED where CTA chest revealed scattered subsegmental pulmonary emboli throughout the right lung, no RHS. She was intubated in the ED due to lack of improvement on BiPAP. No anticoagulation noted PTA. Pharmacy consulted for heparin dosing.   Significant events: -11/21: LE Doppler: Findings consistent with age indeterminate DVT involving the left peroneal veins.  -11/22: lumbar puncture by IR  -11/30: extubated, 12/2 reintubated, bronch - 12/3 extubated, 12/4 re-intubated  Today, 03/27/2023: - Heparin level increased to 0.64, remains therapeutic on 1200 units/hr - CBC: Hgb low/stable at 8, Plt remain elevated - No complications of therapy noted, No bleeding reported by RN.   Goal of Therapy:  Heparin level 0.3-0.7 units/ml Monitor platelets by anticoagulation protocol: Yes   Plan:  -Continue heparin infusion at 1200 units/hr -Daily CBC, heparin level -Monitor closely for s/sx of bleeding  Lynann Beaver PharmD, BCPS [facility] main pharmacy [phone] 03/27/2023 7:10 AM

## 2023-03-27 NOTE — Progress Notes (Signed)
Nutrition Follow-up  DOCUMENTATION CODES:   Non-severe (moderate) malnutrition in context of chronic illness  INTERVENTION:  - Continue TF: Vital 1.2 at 55 ml/h  Provides 1584 kcal, 99 gm protein, 1070 ml free water daily    - FWF per CCM/MD   - Checking micronutrient labs Biotin, vitamin D, vitamin C, and copper.  - Vitamin D: WNL  - All others pending   - Monitor weight trends.   NUTRITION DIAGNOSIS:   Moderate Malnutrition related to chronic illness as evidenced by mild fat depletion, moderate muscle depletion, percent weight loss (23% in 9 months). *ongoing  GOAL:   Patient will meet greater than or equal to 90% of their needs *met with TF  MONITOR:   Vent status, Labs, Weight trends, TF tolerance  REASON FOR ASSESSMENT:   Consult Enteral/tube feeding initiation and management  ASSESSMENT:   72 yr old female with PMH significant of HTN, Hypothyroidism, MDD, cognitive impairment, arthritis who presented with AMS and hypoxia from [facility] after being scheduled for a colonoscopy.  11/20 Admit; Intubated  11/21 Vit 1.2 started at 45mL/hr 11/23 Reached goal of 57mL/hr 11/30 Extubated, OGT removed; Tube feeds stopped 12/2 Re-intubated, OGT placed 12/3 Extubated, OGT removed 12/4 Re-intubated; new OGT placed 12/5 restarting Vital 1.2 at 69mL/hr 12/6 reached goal TF  Patient remains intubated on ventilator support MV: 10.4 L/min Temp (24hrs), Avg:99.5 F (37.5 C), Min:98.8 F (37.1 C), Max:100.2 F (37.9 C)  Receiving patient care from nursing at time of visit.   She continues on goal tube feeds. Noted to be having diarrhea but has been on several antibiotics since admission.  Vitamin D came back WNL but awaiting other micronutrient labs at this time.   CCM discussing tracheostomy with patient's family, decision pending at this time.    Admit weight: 122# Current weight: 128# I&O's: +942mL + generalized edema  Medications reviewed  and include: Colace, Miralax, Lasix, 100mg  vitamin B6, 100mg  thiamine Precedex Fentanyl  Labs reviewed:  Phosphorus 2.4 Vitamin D: 30.33 (WNL) Vitamin C: pending Biotin: pending Copper: pending   Diet Order:   Diet Order             Diet NPO time specified  Diet effective now                   EDUCATION NEEDS:  Education needs have been addressed  Skin:  Skin Assessment: Skin Integrity Issues: Skin Integrity Issues:: Stage II Stage II: Left Buttocks  Last BM:  12/9 - type 7  Height:  Ht Readings from Last 1 Encounters:  03/22/23 5\' 4"  (1.626 m)   Weight:  Wt Readings from Last 1 Encounters:  03/27/23 58.2 kg    BMI:  Body mass index is 22.02 kg/m.  Estimated Nutritional Needs:  Kcal:  1550-1750 Protein:  85-100g Fluid:  1.7L/day    Shelle Iron RD, LDN Contact via Secure Chat.

## 2023-03-27 NOTE — Plan of Care (Signed)
  Problem: Education: Goal: Knowledge of General Education information will improve Description: Including pain rating scale, medication(s)/side effects and non-pharmacologic comfort measures Outcome: Progressing   Problem: Clinical Measurements: Goal: Diagnostic test results will improve Outcome: Progressing Goal: Respiratory complications will improve Outcome: Progressing Goal: Cardiovascular complication will be avoided Outcome: Progressing   Problem: Nutrition: Goal: Adequate nutrition will be maintained Outcome: Progressing   Problem: Coping: Goal: Level of anxiety will decrease Outcome: Progressing   Problem: Pain Management: Goal: General experience of comfort will improve Outcome: Progressing   Problem: Safety: Goal: Ability to remain free from injury will improve Outcome: Progressing

## 2023-03-27 NOTE — Plan of Care (Signed)
  Problem: Clinical Measurements: Goal: Ability to maintain clinical measurements within normal limits will improve Outcome: Progressing Goal: Diagnostic test results will improve Outcome: Progressing Goal: Cardiovascular complication will be avoided Outcome: Progressing   Problem: Nutrition: Goal: Adequate nutrition will be maintained Outcome: Progressing

## 2023-03-27 NOTE — TOC Progression Note (Signed)
Transition of Care [facility]) - Progression Note    Patient Details  Name: Karen [name] MRN: [id number] Date of Birth: [birth year]  Transition of Care [facility]) CM/SW Contact  Geni Bers, RN Phone Number: 03/27/2023, 3:09 PM  Clinical Narrative:     Chart reviewed. TOC will continue to follow.   Expected Discharge Plan: Home/Self Care Barriers to Discharge: Continued Medical Work up  Expected Discharge Plan and Services   Discharge Planning Services: CM Consult   Living arrangements for the past 2 months: Single Family Home                                       Social Determinants of Health (SDOH) Interventions SDOH Screenings   Food Insecurity: No Food Insecurity (03/09/2023)  Housing: Patient Unable To Answer (03/09/2023)  Transportation Needs: No Transportation Needs (03/09/2023)  Utilities: Not At Risk (03/09/2023)  Depression (PHQ2-9): Low Risk  (03/23/2022)  Tobacco Use: Medium Risk (03/18/2023)    Readmission Risk Interventions    03/09/2023    1:14 PM  Readmission Risk Prevention Plan  Transportation Screening Complete  PCP or Specialist Appt within 5-7 Days Complete  Home Care Screening Complete  Medication Review (RN CM) Complete

## 2023-03-27 NOTE — Progress Notes (Addendum)
NAME:  Karen [name], MRN:  [id number], DOB:  1951-03-12, LOS: 19 ADMISSION DATE:  03/08/2023, CONSULTATION DATE:  03/08/23  REFERRING MD: MD Halivand CHIEF COMPLAINT:  AMS, hypoxia   History of Present Illness:  Pt is a 72 yr old female with a significant pmh of HTN, Hypothyroidism, Major Depressive Disorder, cognitive impairment, arthritis, hearing loss, 30-40lb unintentional weight loss since 05/2022. Per patient's husband and daughter endorse that patient has had a functional and cognitive decline since the beginning of the 2024. Patient has had an extensive workup in regards to cognitive impairment and unintentional weight loss with MRI Brain, MRCPs, CT of chest and abdomen with no definitive etiology.   Patient presented to [facility] ED with AMS and hypoxia (O2 Sats in 70s-80s) by EMS from [facility], who was scheduled for a colonoscopy on 11/20. Patient did not receive colonoscopy due to hypoxia and AMS. Initial VBG revealed pH 7.17 and pCO2 > 123. Patient was given narcan during the initial ED workup with no response and subsequently placed on BIPAP due to increasing hypoxia and respiratory distress. Repeat VBG showed no improvement and patient was intubated due to hypoxia/hypercarbia. Also, CT of chest obtained by EDP showed evidence of scattered subsegmental PE and PCCM was consulted for further vent management as well as management for PE.   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    reports that she quit smoking about 22 years ago. Her smoking use included cigarettes. She started smoking about 32 years ago. She has never used smokeless tobacco.   has a past surgical history that includes Cesarean section; Colonoscopy with propofol (N/A, 11/17/2015); Colonoscopy; and Polypectomy.    Significant Hospital Events: Including procedures, antibiotic start and stop dates in addition to other pertinent events   11/20 Admit with AMS, acute hypoxic/hypercarbic respiratory failure, subsegmental PE intubated PCCM consult EF 70%.  Elevated pulm artery pressures 53.  Right ventricle normal. No RV strain on CT.  No emphysema reported on CT. RVP NEG MRSA PCR NEG 11/21 Intubated, sedated, tremors  11/22 Febrile, tachycardic, shivering/tremors seen with physical touch. IR LP obtained LP culture neg 11/23 Neuro consulted. MRI brain ordered 11/25 TRAC ASPIRATE RARE MSSA 11/28 weaning but high respiratory rate, pressure support of 10 11/30 extubated.  Requiring BiPAP.  Transfused 1 unit packed red cells 12/2 mucus plug reintubated, bronch with therapeutic aspiration of RUL, LUL, lingula, bronchus intermedius TRACH ASPIRATE RARE MSSA 12/3 CXR improved, extubated 12/4 reintubation after further discussions with family with bronch 12/5 MRI of C-Spine-neg for spinal infection/cord compression, remains on vent.  12/6 patient following commands, on precedex, will need to readdress GOC with patient and patient family  - Intubated/Sedated but responding  GOC iPAL by Dr Chestine Spore 12.7/24 : FiO2 30% on the ventilator fentanyl infusion present Precedex infusion.  Ongoing heparin infusion and tube feeds.  Is on Unasyn.  Has Foley catheter.  Is afebrile with a white count 7000.  +13.75 L since admission. Per RN -  family interested in  course of steroids 12/9 Following commands, on fentanyl/precedex gtt   Interim History / Subjective:  Alert, following commands with fentanyl/precedex gtt   Objective   Blood pressure 107/74, pulse 87, temperature 99.8 F (37.7 C), temperature source Axillary, resp. rate (!) 22, height 5\' 4"  (1.626 m), weight 58.2 kg, SpO2 98%.    Vent Mode: PRVC FiO2 (%):  [30 %] 30 % Set Rate:  [24 bmp] 24 bmp Vt Set:  [440 mL] 440 mL PEEP:  [8 cmH20] 8 cmH20 Pressure Support:  [14 cmH20-15 cmH20] 14 cmH20 Plateau Pressure:  [20 cmH20-24 cmH20] 22 cmH20   Intake/Output Summary (Last 24 hours) at 03/27/2023 1104 Last data filed at 03/27/2023 0956 Gross per 24 hour  Intake 2482.37 ml  Output 6155 ml  Net -3672.63 ml   Filed Weights   03/25/23 0500 03/26/23 0438 03/27/23 0440  Weight: 60.9 kg 59.4 kg 58.2 kg   Physical Exam: General: older adult female, lying on ICU bed ventilated HEENT: Normocephalic, poor dentition/missing teeth, ETT/OG tube, copious oral secretions Pulm: clear, diminished throughout lung fields, on vent, no respiratory distress Cards: s1, s2, No MRG, RR, no JVD Abdomen: OG tube, BS active, soft Extremities: moves all extremities purposely Neuro: Follows commands, RASS 0 Skin: Stage 2 BL buttocks, epithelialized    Assessment & Plan:  Acute hypoxemic respiratory failure  Subsegmental PEs  Chronic ongoing aspiration events  11/20 Subsegmental Pulmonary Embolisms with LLE DVT  ECHO EF 70-75%  on 11/21 no evidence of RV heart strain  Pt extubated on 11/30, reintubated due to unable to maintain secretions/mucus plugging. Extubated once more on 12/3 and reintubated on 12/4 for aspiration of secretions/mucus plugging  12/4 MRI C-spine neg for infectious process, or cord compression that would contribute to diaphragmatic weakness  Staph aureus growing in tracheal aspirate from 12/2  03/27/2023 - > does YES meet criteria for SBT but NOT for Extubation in  setting of Acute Respiratory Failure due to MSSA VAP , weakness.  Will need trache due to multiple extubation attempts  P:  Continue SBT/WUA as tolerated, will hold off extubation and discuss trache procedure with husband/family  Continue VAD/PAD protocols as tolerated Wean off fentanyl as tolerated, continue to use precedex Maintain RASS goal 0 to -1  Continue Adequate pulmonary hygiene, and management of oral secretions Continue Heparin gtt for PE   MSSA VAP Tracheal aspirate from 12/2 grew Staph Aureus Vanc/Zosyn stopped on 12/6 transitioned to unasyn  P: Continue Unasyn for now   Fluid Overload  12/8 - +13.75L positive 12/9 ~ 10 L positive  Decent urine output with lasix admin, Cr 0.59 P: Continue lasix 60mg  q 8 hrs  Continue to monitor urine output per foley   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  secondary to neurocognitive disorder (dementia) vs chronic aspiration  Extensive workup outpatient with unrevealing work up to date   ~80lb wt loss since beginning of 2024  P:  Continue Tube feeds per RD recs, appreciate assitance Continue Thiamine IV daily  Continue to f/u AchRAB and Micronutrient labs (still pending)   Anemia of Critical Illness  Onset 11/21  12/9 hgb 8 P:  Transfuse if hgb <7 Continue to monitor for signs of bleeding   Hypoglycemia  CBGs stable with tube feeds P: Continue CBGs q 4 Continue tube feeds per RD recs  HTN  Episodes of hypotension in setting of sedation  P: Continue to hold antihypertensives for now   HLD  P:  Continue rosuvastatin per tube   Hypothyroidism P:  Continue levothyroxine per tube   Hypothyroidism P:  Continue levothyroxine per tube   Anxiety  Depression  Dementia P: Continue aricept per tube     Best Practice (right click and "Reselect all SmartList Selections" daily)   Diet/type: TUBE FEEDS DVT prophylaxis: systemic heparin for now Pressure ulcer(s). Stage 2 on Buttocks BL  GI prophylaxis: PPI Lines:  N/A Foley:  Yes, and it is still needed Code Status:  full code Last date of multidisciplinary goals of care discussion: Will update family via phone, currently not at beside  CC: 30 mins  Hazel Sams AGACNP-BC   [facility] 03/27/2023, 11:57 AM  Please see [email or web address] for pager details.  From 7A-7P if no response, please call [phone]. After hours, please call ELink [phone].

## 2023-03-27 NOTE — Progress Notes (Signed)
Notified patient's husband Kateryn Nighswonger) and daughter Kearie Proper) to discuss tracheostomy procedure. Upon patient's hospitalization, patient has required 3 intubations. She has unfortunately failed extubation attempts due to chronic aspiration of oral secretions complicated by profound weakness and dysphagia without clear etiology of cause despite extensive diagnostic work up. With needing full mechanical ventilator support, patient will need tracheostomy to continue aggressive care and continued workup. Patient's husband, daughter, and other family members are leaning toward tracheostomy procedure but have not made full decision at this time. Will follow up later this afternoon with husband when he arrives at beside.   Hazel Sams AGACNP-BC   [facility] 03/27/2023, 2:31 PM  Please see [email or web address] for pager details.  From 7A-7P if no response, please call [phone]. After hours, please call ELink [phone].

## 2023-03-28 ENCOUNTER — Inpatient Hospital Stay (HOSPITAL_COMMUNITY): Payer: Medicare HMO

## 2023-03-28 DIAGNOSIS — I2699 Other pulmonary embolism without acute cor pulmonale: Secondary | ICD-10-CM | POA: Diagnosis not present

## 2023-03-28 DIAGNOSIS — R627 Adult failure to thrive: Secondary | ICD-10-CM | POA: Diagnosis not present

## 2023-03-28 DIAGNOSIS — D649 Anemia, unspecified: Secondary | ICD-10-CM | POA: Diagnosis not present

## 2023-03-28 DIAGNOSIS — J9601 Acute respiratory failure with hypoxia: Secondary | ICD-10-CM | POA: Diagnosis not present

## 2023-03-28 LAB — GLUCOSE, CAPILLARY
Glucose-Capillary: 105 mg/dL — ABNORMAL HIGH (ref 70–99)
Glucose-Capillary: 114 mg/dL — ABNORMAL HIGH (ref 70–99)
Glucose-Capillary: 123 mg/dL — ABNORMAL HIGH (ref 70–99)
Glucose-Capillary: 158 mg/dL — ABNORMAL HIGH (ref 70–99)
Glucose-Capillary: 175 mg/dL — ABNORMAL HIGH (ref 70–99)
Glucose-Capillary: 99 mg/dL (ref 70–99)

## 2023-03-28 LAB — CBC
HCT: 27.4 % — ABNORMAL LOW (ref 36.0–46.0)
Hemoglobin: 8.4 g/dL — ABNORMAL LOW (ref 12.0–15.0)
MCH: 29.8 pg (ref 26.0–34.0)
MCHC: 30.7 g/dL (ref 30.0–36.0)
MCV: 97.2 fL (ref 80.0–100.0)
Platelets: 577 10*3/uL — ABNORMAL HIGH (ref 150–400)
RBC: 2.82 MIL/uL — ABNORMAL LOW (ref 3.87–5.11)
RDW: 15.3 % (ref 11.5–15.5)
WBC: 14.1 10*3/uL — ABNORMAL HIGH (ref 4.0–10.5)
nRBC: 0 % (ref 0.0–0.2)

## 2023-03-28 LAB — PROTIME-INR
INR: 1.2 (ref 0.8–1.2)
Prothrombin Time: 14.9 s (ref 11.4–15.2)

## 2023-03-28 LAB — VITAMIN C: Vitamin C: 0.3 mg/dL — ABNORMAL LOW (ref 0.4–2.0)

## 2023-03-28 LAB — PHOSPHORUS: Phosphorus: 2.5 mg/dL (ref 2.5–4.6)

## 2023-03-28 LAB — HEPARIN LEVEL (UNFRACTIONATED): Heparin Unfractionated: 0.64 [IU]/mL (ref 0.30–0.70)

## 2023-03-28 LAB — MAGNESIUM: Magnesium: 1.7 mg/dL (ref 1.7–2.4)

## 2023-03-28 MED ORDER — ORAL CARE MOUTH RINSE
15.0000 mL | OROMUCOSAL | Status: DC
  Administered 2023-03-28 – 2023-03-29 (×2): 15 mL via OROMUCOSAL

## 2023-03-28 MED ORDER — ORAL CARE MOUTH RINSE: 15.0000 mL | OROMUCOSAL | Status: DC | PRN

## 2023-03-28 MED ORDER — LIDOCAINE-EPINEPHRINE 1 %-1:100000 IJ SOLN
20.0000 mL | Freq: Once | INTRAMUSCULAR | Status: DC
  Filled 2023-03-28: qty 1

## 2023-03-28 MED ORDER — ETOMIDATE 2 MG/ML IV SOLN
20.0000 mg | Freq: Once | INTRAVENOUS | Status: AC
  Administered 2023-03-28: 20 mg via INTRAVENOUS
  Filled 2023-03-28: qty 10

## 2023-03-28 MED ORDER — ROCURONIUM BROMIDE 10 MG/ML (PF) SYRINGE
100.0000 mg | PREFILLED_SYRINGE | Freq: Once | INTRAVENOUS | Status: AC
  Administered 2023-03-28: 50 mg via INTRAVENOUS
  Filled 2023-03-28: qty 10

## 2023-03-28 MED ORDER — FENTANYL CITRATE (PF) 100 MCG/2ML IJ SOLN
200.0000 ug | Freq: Once | INTRAMUSCULAR | Status: AC
  Administered 2023-03-28: 200 ug via INTRAVENOUS
  Filled 2023-03-28: qty 4

## 2023-03-28 MED ORDER — NOREPINEPHRINE 4 MG/250ML-% IV SOLN
INTRAVENOUS | Status: AC
  Filled 2023-03-28: qty 250

## 2023-03-28 MED ORDER — HEPARIN (PORCINE) 25000 UT/250ML-% IV SOLN
1200.0000 [IU]/h | INTRAVENOUS | Status: AC
  Administered 2023-03-28 – 2023-04-01 (×5): 1200 [IU]/h via INTRAVENOUS
  Filled 2023-03-28 (×4): qty 250

## 2023-03-28 MED ORDER — MIDAZOLAM HCL 2 MG/2ML IJ SOLN
5.0000 mg | Freq: Once | INTRAMUSCULAR | Status: AC
  Administered 2023-03-28: 4 mg via INTRAVENOUS
  Filled 2023-03-28: qty 6

## 2023-03-28 MED ORDER — PROPOFOL 10 MG/ML IV BOLUS: 500.0000 mg | Freq: Once | INTRAVENOUS | Status: DC

## 2023-03-28 MED ORDER — PHENYLEPHRINE 80 MCG/ML (10ML) SYRINGE FOR IV PUSH (FOR BLOOD PRESSURE SUPPORT)
PREFILLED_SYRINGE | INTRAVENOUS | Status: AC
  Filled 2023-03-28: qty 10

## 2023-03-28 NOTE — Procedures (Signed)
Diagnostic Bronchoscopy  Laylynn Bracken  [id number]  1950-06-27  Date:03/28/23  Time:1:25 PM   Provider Performing:Clancy Leiner Erby Pian   Procedure: Diagnostic Bronchoscopy [phone]), Sub aspiration tracheobronchial tree  Indication(s) Assist with direct visualization of tracheostomy placement  Consent Risks of the procedure as well as the alternatives and risks of each were explained to the patient and/or caregiver.  Consent for the procedure was obtained.   Anesthesia See separate tracheostomy note   Time Out Verified patient identification, verified procedure, site/side was marked, verified correct patient position, special equipment/implants available, medications/allergies/relevant history reviewed, required imaging and test results available.   Sterile Technique Usual hand hygiene, masks, gowns, and gloves were used   Procedure Description Bronchoscope advanced through endotracheal tube and into airway.  After suctioning out tracheal secretions, bronchoscope used to provide direct visualization of tracheostomy placement.  Trach balloon entirely within tracheal lumen.  Thick obstructing secretions LLL suctioned.   Complications/Tolerance None; patient tolerated the procedure well.   EBL None  Specimen(s) None

## 2023-03-28 NOTE — Progress Notes (Addendum)
NAME:  Karen [name], MRN:  [id number], DOB:  [birth year], LOS: 20 ADMISSION DATE:  03/08/2023, CONSULTATION DATE:  03/08/23  REFERRING MD: MD Halivand CHIEF COMPLAINT:  AMS, hypoxia   History of Present Illness:  Pt is a 72 yr old female with a significant pmh of HTN, Hypothyroidism, Major Depressive Disorder, cognitive impairment, arthritis, hearing loss, 30-40lb unintentional weight loss since 05/2022. Per patient's husband and daughter endorse that patient has had a functional and cognitive decline since the beginning of the 2024. Patient has had an extensive workup in regards to cognitive impairment and unintentional weight loss with MRI Brain, MRCPs, CT of chest and abdomen with no definitive etiology.   Patient presented to [facility] ED with AMS and hypoxia (O2 Sats in 70s-80s) by EMS from [facility], who was scheduled for a colonoscopy on 11/20. Patient did not receive colonoscopy due to hypoxia and AMS. Initial VBG revealed pH 7.17 and pCO2 > 123. Patient was given narcan during the initial ED workup with no response and subsequently placed on BIPAP due to increasing hypoxia and respiratory distress. Repeat VBG showed no improvement and patient was intubated due to hypoxia/hypercarbia. Also, CT of chest obtained by EDP showed evidence of scattered subsegmental PE and PCCM was consulted for further vent management as well as management for PE.   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    reports that she quit smoking about 22 years ago. Her smoking use included cigarettes. She started smoking about 32 years ago. She has never used smokeless tobacco.   has a past surgical history that includes Cesarean section; Colonoscopy with propofol (N/A, 11/17/2015); Colonoscopy; and Polypectomy.    Significant Hospital Events: Including procedures, antibiotic start and stop dates in addition to other pertinent events   11/20 Admit with AMS, acute hypoxic/hypercarbic respiratory failure, subsegmental PE intubated PCCM consult EF 70%.  Elevated pulm artery pressures 53.  Right ventricle normal. No RV strain on CT.  No emphysema reported on CT. RVP NEG MRSA PCR NEG 11/21 Intubated, sedated, tremors  11/22 Febrile, tachycardic, shivering/tremors seen with physical touch. IR LP obtained LP culture neg 11/23 Neuro consulted. MRI brain ordered 11/25 TRAC ASPIRATE RARE MSSA 11/28 weaning but high respiratory rate, pressure support of 10 11/30 extubated.  Requiring BiPAP.  Transfused 1 unit packed red cells 12/2 mucus plug reintubated, bronch with therapeutic aspiration of RUL, LUL, lingula, bronchus intermedius TRACH ASPIRATE RARE MSSA 12/3 CXR improved, extubated 12/4 reintubation after further discussions with family with bronch 12/5 MRI of C-Spine-neg for spinal infection/cord compression, remains on vent.  12/6 patient following commands, on precedex, will need to readdress GOC with patient and patient family  - Intubated/Sedated but responding  GOC iPAL by Dr Chestine Spore 12.7/24 : FiO2 30% on the ventilator fentanyl infusion present Precedex infusion.  Ongoing heparin infusion and tube feeds.  Is on Unasyn.  Has Foley catheter.  Is afebrile with a white count 7000.  +13.75 L since admission. Per RN -  family interested in  course of steroids 12/9 Following commands, on fentanyl/precedex gtt  12/10 Trache procedure scheduled at 1300, following commands, on fentanyl gtt   Interim History / Subjective:  Follow commands with minimal continuous sedation  No major events overnight   Objective   Blood pressure 121/69, pulse 87, temperature 98.7 F (37.1 C), temperature source Axillary, resp. rate (!) 23, height 5\' 4"  (1.626 m), weight 54.7 kg, SpO2 99%.    Vent Mode: PRVC FiO2 (%):  [30 %] 30 % Set Rate:  [24 bmp] 24 bmp Vt Set:  [440 mL] 440 mL PEEP:  [8 cmH20] 8 cmH20 Plateau Pressure:  [23 cmH20-24 cmH20] 23 cmH20   Intake/Output Summary (Last 24 hours) at 03/28/2023 0945 Last data filed at 03/28/2023 6213 Gross per 24 hour  Intake 2541.92 ml  Output 4175 ml  Net -1633.08 ml   Filed Weights   03/26/23 0438 03/27/23 0440 03/28/23 0408  Weight: 59.4 kg 58.2 kg 54.7 kg   Physical Exam: General: older adult female lying in ICU bed, on vent older adult female HEENT: Normocephalic, poor dentition/missing teeth, ETT/OG tube, copious clear oral secretions  Pulm: clear, diminished throughout lung fields, on vent, no respiratory distress  Cards: s1, s2, RRR, no MRG  Abdomen: OG tube with tube feeds, BS active, soft  Extremities: moves all extremities purposefully, weak Neuro: Follows commands, RASS 0  Skin: Stage 2 Buttocks BL, healing   Assessment & Plan:  Acute hypoxemic respiratory failure  Subsegmental PEs  Chronic ongoing aspiration events  11/20 Subsegmental Pulmonary Embolisms with LLE DVT  ECHO EF 70-75%  on 11/21 no evidence of RV heart strain  12/4 MRI C-spine neg for infectious process, or cord compression that would contribute to diaphragmatic weakness  Will need trache due to multiple extubation attempts unable to maintain airway see hospital course, husband/family in agreement for trache procedure  P:  Obtain consent for tracheostomy scheduled at 1300 Obtain  trache tray/supplies/sedation  Continue RASS 0 to -1  Will remain on fentanyl and precedex after trache procedure  Continue Adequate pulmonary hygiene, and management of oral secretions Hold heparin gtt, pharmacy notified  Obtain PT/INR Hold Tube feeds  MSSA VAP Leukocytosis 12.9 to 14.1, no evidence of fevers last 24-36hrs, On steroids  Tracheal aspirate from 12/2 grew Staph Aureus Vanc/Zosyn stopped on 12/6 transitioned to unasyn  P: Continue unasyn, will dc on 12/11  Fluid Overload  12/10 ~ 8.5 L positive  Decent urine output with lasix admin BP soft over last couple hours, still having decent urine output  P: Will hold Lasix for now  Continue to monitor urine output per foley   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  secondary to neurocognitive disorder (dementia) vs chronic aspiration  Extensive workup outpatient with unrevealing results to date   ~80lb wt loss since beginning of 2024  AchRAB 0.03, negative  P:  Hold tube feeds, until after trache procedure Place small bore NG feeding tube Continue tube feed recs per RD, appreciate assistance Continue Thiamine IV daily  Micronutrient labs still pending, continue to f/u Continue to follow Mag, phos daily   Anemia of Critical Illness  Hgb stable, 8.4 12/10 P:  Transfuse if hgb <7 Continue to monitor for signs of bleeding Hold heparin gtt for tracheostomy   Hypoglycemia  CBGs stable with tube feeds P: Continue CBGs q4 Continue tube feeds per RD recs, -hold till trache procedure  HTN  Episodes of hypotension in setting of sedation  P: Continue to hold antihypertensives for now  HLD  P:  Continue rosuvastatin per tube   Hypothyroidism P: Continue levothyroxine per tube   Anxiety  Depression  Dementia P: Continue aricept per tube   Best Practice (right click and "Reselect all SmartList Selections" daily)   Diet/type: TUBE FEEDS DVT prophylaxis: systemic heparin for now Pressure ulcer(s). Stage 2 on  Buttocks BL  GI prophylaxis: PPI Lines: N/A Foley:  Yes, and it is still needed Code Status:  full code Last date of multidisciplinary goals of care discussion:  Husband and son updated in regards to tracheostomy procedure. Husband and family are in agreement for tracheostomy scheduled at 1300.   CC: 30 mins  Christian Smith AGACNP-BC   [facility] 03/28/2023, 9:45 AM  Please see [email or web address] for pager details.  From 7A-7P if no response, please call [phone]. After hours, please call ELink [phone].   Patient seen, independently examined, care plan was formulated and discussed with Rochel Brome, NP as per documentation Patient with chronic respiratory failure, remains on vent With plans for tracheostomy today No overnight events  Elderly, interactive Decreased air entry bilaterally S1-S2 appreciated Bowel sounds appreciated Extremities with no clubbing, no edema  I reviewed nursing notes, last 24 h vitals and pain scores, last 48 h intake and output, last 24 h labs and trends, and last 24 h imaging results.  Assessment/plan Acute hypoxemic respiratory failure Subsegmental PEs Chronic aspirations MSSA pneumonia-treated Failure to thrive Malnutrition Hypoglycemia Hypertension  For tracheostomy today  Continue antibiotics Continue Aricept Continue tube feeds  Guarded prognosis  The patient is critically ill with multiple organ systems failure and requires high complexity decision making for assessment and support, frequent evaluation and titration of therapies, application of advanced monitoring technologies and extensive interpretation of multiple databases. Critical Care Time devoted to patient care services described in this note independent of APP/resident time (if applicable)  is 32 minutes.   Virl Diamond MD [facility] Personal pager: See Amion If unanswered, please page CCM On-call: #[phone]

## 2023-03-28 NOTE — Progress Notes (Addendum)
PHARMACY - ANTICOAGULATION CONSULT NOTE  Pharmacy Consult for heparin Indication: acute pulmonary embolus  Allergies  Allergen Reactions   Zoloft [Sertraline Hcl] Anxiety and Other (See Comments)    "crazy feeling"    Patient Measurements: Height: 5\' 4"  (162.6 cm) Weight: 54.7 kg (120 lb 9.5 oz) IBW/kg (Calculated) : 54.7 Heparin Dosing Weight: TBW  Vital Signs: Temp: 98.8 F (37.1 C) (12/10 0419) Temp Source: Axillary (12/10 0419) BP: 92/61 (12/10 0600)  Labs: Recent Labs    03/26/23 0344 03/27/23 0411 03/27/23 0510 03/28/23 0403  HGB 8.1* 8.0*  --  8.4*  HCT 26.5* 25.8*  --  27.4*  PLT 556* 570*  --  577*  HEPARINUNFRC 0.48  --  0.64 0.64  CREATININE 0.49 0.59  --   --     Estimated Creatinine Clearance: 56.5 mL/min (by C-G formula based on SCr of 0.59 mg/dL).   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 Three variants of uncertain significance were detected - one in the AXIN2 gene called c.1829G>A, one in the PMS2 gene called c.964G>A, and one in the RNF43 gene called c.1114C>T. The report date is 06/24/2019.     The Multi-Cancer Panel offered by [facility] includes sequencing and/or deletion duplication test   GERD (gastroesophageal reflux disease)    History of colonic polyps    Hyperlipidemia    Hypertension    Hypothyroidism    Major depressive disorder    Osteoarthritis of knee 07/20/2021   Subjective tinnitus of both ears  03/26/2019   Tick bite 08/2015   UTI (urinary tract infection) 10/2015   Assessment: 72 YO female presenting for a colonoscopy procedure 11/20 with SpO2 70-80% and slight shortness of breath. She was transferred [facility] ED where CTA chest revealed scattered subsegmental pulmonary emboli throughout the right lung, no RHS. She was intubated in the ED due to lack of improvement on BiPAP. No anticoagulation noted PTA. Pharmacy consulted for heparin dosing.   Significant events: -11/21: LE Doppler: Findings consistent with age indeterminate DVT involving the left peroneal veins.  -11/22: lumbar puncture by IR  -11/30: extubated, 12/2 reintubated, bronch - 12/3 extubated, 12/4 re-intubated  Today, 03/28/2023: - Heparin level 0.64, remains therapeutic on 1200 units/hr - CBC: Hgb low/stable at 8.4, Plt remain elevated - No complications of therapy noted, No bleeding reported by RN.   Goal of Therapy:  Heparin level 0.3-0.7 units/ml Monitor platelets by anticoagulation protocol: Yes   Plan:  -Continue heparin infusion at 1200 units/hr -Daily CBC, heparin level -Monitor closely for s/sx of bleeding   ADDENDUM: Patient had a tracheostomy procedure done 12/10 @1300 . Heparin was held ~0900 with plan to restart at 1600, per CCM. Plan: Restart heparin gtt at prior rate of 1200 units/hr @1600  Daily heparin level, CBC Monitor for s/sx of bleeding daily     Cherylin Mylar, PharmD Clinical Pharmacist  12/10/20247:10 AM

## 2023-03-28 NOTE — Procedures (Signed)
Percutaneous Tracheostomy Procedure Note   Karen [name]  [id number]  12/28/1950  Date:03/28/23  Time:2:07 PM   Provider Performing:Anyelo Mccue  Procedure: Percutaneous Tracheostomy with Bronchoscopic Guidance (29528)  Indication(s) Prolonged mechanical ventilation.   Consent Risks of the procedure as well as the alternatives and risks of each were explained to the patient and/or caregiver.  Consent for the procedure was obtained.  Anesthesia Etomidate, Versed, Fentanyl, Vecuronium   Time Out Verified patient identification, verified procedure, site/side was marked, verified correct patient position, special equipment/implants available, medications/allergies/relevant history reviewed, required imaging and test results available.   Sterile Technique Maximal sterile technique including sterile barrier drape, hand hygiene, sterile gown, sterile gloves, mask, hair covering.    Procedure Description Appropriate anatomy identified by palpation.  Patient's neck prepped and draped in sterile fashion.  1% lidocaine with epinephrine was used to anesthetize skin overlying neck.  1.5cm incision made and blunt dissection performed until tracheal rings could be easily palpated.   Then a size 6 Shiley tracheostomy was placed under bronchoscopic visualization using usual Seldinger technique and serial dilation.   Bronchoscope confirmed placement above the carina.  Tracheostomy was sutured in place with adhesive pad to protect skin under pressure.    Patient connected to ventilator.   Complications/Tolerance None; patient tolerated the procedure well. Chest X-ray is ordered to confirm no post-procedural complication.   EBL Minimal   Specimen(s) None    Dr Levon Hedger was present throughout and proctored the procedure.   Lynnell Catalan, MD [facility] ICU Physician [facility] [place] [facility]  Pager: [phone] Or Epic Secure Chat After hours: [phone].  03/28/2023,  2:08 PM

## 2023-03-28 NOTE — Progress Notes (Signed)
Plan for tracheostomy today:  Pre-procedure Prep: Heparin held at 0957 and tube feeding held at 0900.  Precedex increased to 0.8 mcg at 1050/  Time of Procedure - 1300 Bedside Procedure Informed consent signed by patient's husband Time out performed.  Medications ordered by MD: 2 mg Versed at 1304 and 1322 push 100 mcg Fentanyl at 1305 and 1318 push 20 mg Etomidate at 1305 push 50 mg Rocuronium at 1306 push Fentanyl bolus 100 mcg at 1310 and 1325 during procedure for BP management.  Other procedure done by RN:  Small bore feeding tube inserted post procedure.  Marking at nare 65.  Confirmed placement by x-ray.  Restart heparin and tube feedings at 4pm.

## 2023-03-28 NOTE — Progress Notes (Signed)
PT Cancellation Note  Patient Details Name: Karen [name] MRN: [id number] DOB: [birth year]   Cancelled Treatment:    Reason Eval/Treat Not Completed: Medical issues which prohibited therapy. Pt is to have Trach placed at 1300 and pt is not currently appropriate for PT eval. PT to continue to follow acutely.   [name], PT Acute Rehab   [name] 03/28/2023, 11:40 AM

## 2023-03-28 NOTE — Progress Notes (Signed)
OT Cancellation Note  Patient Details Name: Karen [name] MRN: [id number] DOB: 02/18/1951   Cancelled Treatment:    Reason Eval/Treat Not Completed: Medical issues which prohibited therapy Patient is pending trach placement at this time. OT to continue to follow and check back as schedule will allow.  Rosalio Loud, MS Acute Rehabilitation Department Office# [phone]  03/28/2023, 12:18 PM

## 2023-03-28 NOTE — Plan of Care (Signed)
  Problem: Nutrition: Goal: Adequate nutrition will be maintained Outcome: Progressing   Problem: Pain Management: Goal: General experience of comfort will improve Outcome: Progressing   Problem: Safety: Goal: Ability to remain free from injury will improve Outcome: Progressing

## 2023-03-28 NOTE — Progress Notes (Signed)
eLink Physician-Brief Progress Note Patient Name: Karen [name] DOB: 07-Nov-1950 MRN: [id number]   Date of Service  03/28/2023  HPI/Events of Note  Patient needs restraints order renewed for patient's safety,  eICU Interventions  Restraints order renewed.        [name] 03/28/2023, 9:15 PM

## 2023-03-28 NOTE — Plan of Care (Signed)
  Problem: Education: Goal: Knowledge about tracheostomy care/management will improve Outcome: Progressing   Problem: Health Behavior/Discharge Planning: Goal: Ability to manage tracheostomy will improve Outcome: Progressing   Problem: Respiratory: Goal: Patent airway maintenance will improve Outcome: Progressing

## 2023-03-28 NOTE — Progress Notes (Signed)
SLP Cancellation Note  Patient Details Name: Karen [name] MRN: [id number] DOB: March 25, 1951   Cancelled treatment:       Reason Eval/Treat Not Completed: Medical issues which prohibited therapy (pt just received trach today, still with significant secretions per MD note; will continue efforts for PMSV and swallow evaluation as ordered)  Rolena Infante, MS [facility] SLP Acute Rehab Services Office [phone]   Chales Abrahams 03/28/2023, 1:52 PM

## 2023-03-29 DIAGNOSIS — J9601 Acute respiratory failure with hypoxia: Secondary | ICD-10-CM | POA: Diagnosis not present

## 2023-03-29 LAB — CBC
HCT: 25.4 % — ABNORMAL LOW (ref 36.0–46.0)
Hemoglobin: 7.8 g/dL — ABNORMAL LOW (ref 12.0–15.0)
MCH: 30.1 pg (ref 26.0–34.0)
MCHC: 30.7 g/dL (ref 30.0–36.0)
MCV: 98.1 fL (ref 80.0–100.0)
Platelets: 505 10*3/uL — ABNORMAL HIGH (ref 150–400)
RBC: 2.59 MIL/uL — ABNORMAL LOW (ref 3.87–5.11)
RDW: 15.4 % (ref 11.5–15.5)
WBC: 13.8 10*3/uL — ABNORMAL HIGH (ref 4.0–10.5)
nRBC: 0 % (ref 0.0–0.2)

## 2023-03-29 LAB — GLUCOSE, CAPILLARY
Glucose-Capillary: 130 mg/dL — ABNORMAL HIGH (ref 70–99)
Glucose-Capillary: 114 mg/dL — ABNORMAL HIGH (ref 70–99)
Glucose-Capillary: 119 mg/dL — ABNORMAL HIGH (ref 70–99)
Glucose-Capillary: 106 mg/dL — ABNORMAL HIGH (ref 70–99)
Glucose-Capillary: 99 mg/dL (ref 70–99)
Glucose-Capillary: 91 mg/dL (ref 70–99)

## 2023-03-29 LAB — HEPARIN LEVEL (UNFRACTIONATED): Heparin Unfractionated: 0.58 [IU]/mL (ref 0.30–0.70)

## 2023-03-29 LAB — BASIC METABOLIC PANEL
Anion gap: 8 (ref 5–15)
BUN: 29 mg/dL — ABNORMAL HIGH (ref 8–23)
CO2: 25 mmol/L (ref 22–32)
Calcium: 9.6 mg/dL (ref 8.9–10.3)
Chloride: 101 mmol/L (ref 98–111)
Creatinine, Ser: 0.51 mg/dL (ref 0.44–1.00)
GFR, Estimated: >60 mL/min (ref >60)
Glucose, Bld: 124 mg/dL — ABNORMAL HIGH (ref 70–99)
Potassium: 3.3 mmol/L — ABNORMAL LOW (ref 3.5–5.1)
Sodium: 134 mmol/L — ABNORMAL LOW (ref 135–145)

## 2023-03-29 LAB — MISC LABCORP TEST (SEND OUT): Labcorp test code: 70097

## 2023-03-29 LAB — MAGNESIUM: Magnesium: 1.7 mg/dL (ref 1.7–2.4)

## 2023-03-29 LAB — PHOSPHORUS: Phosphorus: 3.2 mg/dL (ref 2.5–4.6)

## 2023-03-29 MED ORDER — ADULT MULTIVITAMIN LIQUID CH
15.0000 mL | Freq: Every day | ORAL | Status: DC
  Administered 2023-03-30 – 2023-04-05 (×7): 15 mL
  Filled 2023-03-29 (×7): qty 15

## 2023-03-29 MED ORDER — VITAMIN C 500 MG PO TABS
500.0000 mg | ORAL_TABLET | Freq: Two times a day (BID) | ORAL | Status: DC
  Administered 2023-03-29: 500 mg via ORAL
  Filled 2023-03-29: qty 1

## 2023-03-29 MED ORDER — CLONAZEPAM 0.5 MG PO TABS
0.5000 mg | ORAL_TABLET | Freq: Two times a day (BID) | ORAL | Status: DC
  Administered 2023-03-29 – 2023-03-30 (×3): 0.5 mg via ORAL
  Filled 2023-03-29 (×3): qty 1

## 2023-03-29 MED ORDER — ADULT MULTIVITAMIN LIQUID CH
15.0000 mL | Freq: Every day | ORAL | Status: DC
  Administered 2023-03-29: 15 mL via ORAL
  Filled 2023-03-29: qty 15

## 2023-03-29 MED ORDER — COPPER 2 MG PO TABS
2.0000 mg | Freq: Every day | Status: DC
  Administered 2023-03-30 – 2023-04-05 (×7): 2 mg
  Filled 2023-03-29 (×7): qty 1

## 2023-03-29 MED ORDER — METHYLPREDNISOLONE SODIUM SUCC 40 MG IJ SOLR
40.0000 mg | Freq: Every day | INTRAMUSCULAR | Status: AC
  Administered 2023-03-30 – 2023-03-31 (×2): 40 mg via INTRAVENOUS
  Filled 2023-03-29 (×2): qty 1

## 2023-03-29 MED ORDER — ORAL CARE MOUTH RINSE: 15.0000 mL | OROMUCOSAL | Status: DC | PRN

## 2023-03-29 MED ORDER — COPPER 2 MG PO TABS
2.0000 mg | Freq: Every day | Status: DC
  Administered 2023-03-29: 2 mg via ORAL
  Filled 2023-03-29: qty 1

## 2023-03-29 MED ORDER — VITAMIN C 500 MG PO TABS
500.0000 mg | ORAL_TABLET | Freq: Two times a day (BID) | ORAL | Status: DC
  Administered 2023-03-29 – 2023-04-05 (×14): 500 mg
  Filled 2023-03-29 (×14): qty 1

## 2023-03-29 MED ORDER — ORAL CARE MOUTH RINSE
15.0000 mL | OROMUCOSAL | Status: DC
  Administered 2023-03-29 – 2023-03-30 (×6): 15 mL via OROMUCOSAL

## 2023-03-29 NOTE — Progress Notes (Signed)
NAME:  Karen [name], MRN:  [id number], DOB:  1950/05/20, LOS: 21 ADMISSION DATE:  03/08/2023, CONSULTATION DATE:  03/08/23  REFERRING MD: MD Halivand CHIEF COMPLAINT:  AMS, hypoxia   History of Present Illness:  Pt is a 72 yr old female with a significant pmh of HTN, Hypothyroidism, Major Depressive Disorder, cognitive impairment, arthritis, hearing loss, 30-40lb unintentional weight loss since 05/2022. Per patient's husband and daughter endorse that patient has had a functional and cognitive decline since the beginning of the 2024. Patient has had an extensive workup in regards to cognitive impairment and unintentional weight loss with MRI Brain, MRCPs, CT of chest and abdomen with no definitive etiology.   Patient presented to [facility] ED with AMS and hypoxia (O2 Sats in 70s-80s) by EMS from [facility], who was scheduled for a colonoscopy on 11/20. Patient did not receive colonoscopy due to hypoxia and AMS. Initial VBG revealed pH 7.17 and pCO2 > 123. Patient was given narcan during the initial ED workup with no response and subsequently placed on BIPAP due to increasing hypoxia and respiratory distress. Repeat VBG showed no improvement and patient was intubated due to hypoxia/hypercarbia. Also, CT of chest obtained by EDP showed evidence of scattered subsegmental PE and PCCM was consulted for further vent management as well as management for PE.   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    reports that she quit smoking about 22 years ago. Her smoking use included cigarettes. She started smoking about 32 years ago. She has never used smokeless tobacco.   has a past surgical history that includes Cesarean section; Colonoscopy with propofol (N/A, 11/17/2015); Colonoscopy; and Polypectomy.    Significant Hospital Events: Including procedures, antibiotic start and stop dates in addition to other pertinent events   11/20 Admit with AMS, acute hypoxic/hypercarbic respiratory failure, subsegmental PE intubated No RV strain on CT.  No emphysema reported on CT. PCCM consult EF 70%.  Elevated pulm artery pressures 53.  Right ventricle normal. RVP NEG MRSA PCR NEG 11/21 Intubated, sedated, tremors  11/22 Febrile, tachycardic, shivering/tremors seen with physical touch. IR LP obtained > negative 11/23 Neuro consulted. MRI brain ordered 11/25 TRAC ASPIRATE > RARE MSSA 11/28 weaning but high respiratory rate, pressure support of 10 11/30 extubated.  Requiring BiPAP.  Transfused 1 unit packed red cells 12/2 mucus plug reintubated, bronch with therapeutic aspiration of RUL, LUL, lingula, bronchus intermedius TRACH ASPIRATE RARE MSSA 12/3 CXR improved, extubated 12/4 reintubation after further discussions with family with bronch 12/5 MRI of C-Spine-neg for spinal infection/cord compression, remains on vent.  12/6 patient following commands, on precedex, will need to readdress GOC with patient and patient family  - Intubated/Sedated but responding  GOC iPAL by Dr Chestine Spore 12.7/24 FiO2 30% on the ventilator fentanyl infusion present Precedex infusion.  Ongoing heparin infusion and tube feeds.  Is on Unasyn.  Has Foley catheter.  afebrile with a white count 7000.  +13.75 L since admission. Per RN - family  interested in course of  steroids 12/9 Following commands, on fentanyl/precedex gtt  12/10 Bedside trach following commands, on fentanyl gtt  12/11 No issues overnight   Interim History / Subjective:  See lying in bed, opens eyes to verbal stimuli   Objective   Blood pressure 133/76, pulse 77, temperature 98.1 F (36.7 C), temperature source Axillary, resp. rate (!) 24, height 5\' 4"  (1.626 m), weight 52.3 kg, SpO2 100%.    Vent Mode: PRVC FiO2 (%):  [30 %-100 %] 30 % Set Rate:  [24 bmp] 24 bmp Vt Set:  [440 mL] 440 mL PEEP:  [8 cmH20] 8 cmH20 Plateau Pressure:  [20 cmH20-23 cmH20] 20 cmH20   Intake/Output Summary (Last 24 hours) at 03/29/2023 0827 Last data filed at 03/29/2023 4782 Gross per 24 hour  Intake 2433.35 ml  Output 2335 ml  Net 98.35 ml   Filed Weights   03/27/23 0440 03/28/23 0408 03/29/23 0500  Weight: 58.2 kg 54.7 kg 52.3 kg   Physical Exam: General: Acute on chronically ill appearing elderly female lying in bed on mechanical ventilation via trach, in NAD HEENT: Trach midline, MM pink/moist, PERRL,  Neuro: Alert and interactive, generally weak CV: s1s2 regular rate and rhythm, no murmur, rubs, or gallops,  PULM:  Clear to ascultation, no increased work of breathing, no added breath sounds  GI: soft, bowel sounds active in all 4 quadrants, non-tender, non-distended, tolerating TF Extremities: warm/dry, no edema  Skin: no rashes or lesions  Resolved problems:   Fluid Overload - improved  -On 12/10 patient was estimated ~ 8.5 L positive by 12/11 down to ~1L positive, appear net even on exam   Assessment & Plan:  Acute hypoxemic respiratory failure  MSSA VAP -Tracheal aspirate from 12/2 grew Staph Aureus, Vanc/Zosyn stopped on 12/6 transitioned to unasyn  Chronic ongoing aspiration events no s/p percutaneous tracheostomy  P:  Trial of ATC today, will likely need periods of rest on vent  Routine trach care Continue ventilator support with lung protective  strategies  Wean PEEP and FiO2 for sats greater than 90%. Head of bed elevated 30 degrees. Plateau pressures less than 30 cm H20.  Follow intermittent chest x-ray and ABG.   SAT/SBT as tolerated, mentation preclude extubation  Ensure adequate pulmonary hygiene  Follow cultures  VAP bundle in place  PAD protocol Aspiration precautions  Unasyn with stop date in place  Subsegmental PEs  -11/20 Subsegmental Pulmonary Embolisms with LLE DVT  ECHO EF 70-75%  on 11/21 no evidence of RV heart strain  P: Continue heparin drip for now  Can likely transition to DOAC once no further procedure needed (possible PEG)   Progressive weakness  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  Low vitamin B7, copper, and vitamin C - Secondary to neurocognitive disorder (dementia) vs chronic aspiration  -Extensive workup outpatient with unrevealing results to date including negative LP and MRI C-spine   P: PT/OT efforts as able Delirium precautions  Continue tube feeds, may need to consider PEG tube  Supplement thiamine and micronutrients  Optimize electrolytes   Anemia of Critical Illness  P:  Transfuse per protocol Hgb goal >7 Continue heparin drip   Hypoglycemia  -CBGs stable with tube feeds P: SSI  CBG goal 140-180 CBG checks q4  HTN  HLD -Episodes of hypotension in setting of sedation  P: Continuous telemetry  Continue statin   Hypothyroidism P: Continue synthroid   Anxiety  Depression  Dementia P: Continue Aricept   Best Practice (right click and "Reselect all SmartList Selections" daily)  Diet/type: TUBE FEEDS DVT prophylaxis: systemic heparin for now Pressure ulcer(s). Stage 2 on Buttocks BL  GI prophylaxis: PPI Lines: N/A Foley:  Yes, and it is still needed Code Status:  full code Last date of multidisciplinary goals of care discussion:  Husband and son updated in regards to tracheostomy procedure. Husband and family are in agreement for tracheostomy scheduled at 1300.    CRITICAL CARE Performed by: Rajah Tagliaferro D. Harris   Total critical care time: 38 minutes  Critical care time was exclusive of separately billable procedures and treating other patients.  Critical care was necessary to treat or prevent imminent or life-threatening deterioration.  Critical care was time spent personally by me on the following activities: development of treatment plan with patient and/or surrogate as well as nursing, discussions with consultants, evaluation of patient's response to treatment, examination of patient, obtaining history from patient or surrogate, ordering and performing treatments and interventions, ordering and review of laboratory studies, ordering and review of radiographic studies, pulse oximetry and re-evaluation of patient's condition.  Jassen Sarver D. Harris, NP-C [facility] Personal contact information can be found on Amion  If no contact or response made please call [phone] 03/29/2023, 10:11 AM

## 2023-03-29 NOTE — Evaluation (Signed)
SLP Cancellation Note  Patient Details Name: Karen [name] MRN: [id number] DOB: 1951-01-31   Cancelled treatment:       Reason Eval/Treat Not Completed: Other (comment) (pt remains on full vent support, messaged Whitney with CCM who approved SLP holding off on PMSV trial until next week; Monday, 16th; will follow up then) Rolena Infante, MS [facility] SLP Acute Rehab Services Office [phone]   Chales Abrahams 03/29/2023, 5:31 PM

## 2023-03-29 NOTE — Progress Notes (Signed)
Nutrition Follow-up  DOCUMENTATION CODES:   Non-severe (moderate) malnutrition in context of chronic illness  INTERVENTION:  - Continue TF: Vital 1.2 at 55 ml/h  Provides 1584 kcal, 99 gm protein, 1070 ml free water daily    - FWF per CCM/MD   - Checking micronutrient labs Biotin, vitamin D, vitamin C, and copper.             - Vitamin D: WNL             - Vitamin C: low, add 500mg  vitamin C BID x30 days then recheck  - Copper: low, add 2mg  x30 days then recheck - Biotin: low, add 10mg  x30 days then recheck (starting tomorrow, once pharmacy as in stock)  - Vitamin B6: low, continue 100mg  daily x30 days  - Add liquid multivitamin with minerals.   - Monitor weight trends.   NUTRITION DIAGNOSIS:   Moderate Malnutrition related to chronic illness as evidenced by mild fat depletion, moderate muscle depletion, percent weight loss (23% in 9 months). *ongoing  GOAL:   Patient will meet greater than or equal to 90% of their needs *met with TF  MONITOR:   Vent status, Labs, Weight trends, TF tolerance  REASON FOR ASSESSMENT:   Consult Enteral/tube feeding initiation and management  ASSESSMENT:   72 yr old female with PMH significant of HTN, Hypothyroidism, MDD, cognitive impairment, arthritis who presented with AMS and hypoxia from [facility] after being scheduled for a colonoscopy.  11/20 Admit; Intubated  11/21 Vit 1.2 started at 17mL/hr 11/23 Reached goal of 22mL/hr 11/30 Extubated, OGT removed; Tube feeds stopped 12/2 Re-intubated, OGT placed 12/3 Extubated, OGT removed 12/4 Re-intubated; new OGT placed 12/5 restarting Vital 1.2 at 61mL/hr 12/6 reached goal TF 12/10 s/p trach; new SBFT placed  Patient is s/p trach yesterday. Trached to the vent this AM but plan for ATC trials today.   New SBFT placed yesterday and tube feeds resumed at goal. Patient continues tolerate tube feeds well.  Ongoing diarrhea but she has been on several antibiotics since  admission and remains on one at this time. Can consider banatrol once more stable.  CCM to discuss possible PEG with family.   Micronutrient labs came back, patient deficient in biotin, copper, and vitamin C. Starting copper and vitamin C today. Per pharmacy, no biotin stocked but can order and will be here tomorrow. Will start biotin supplementation tomorrow. Discussed with CCM.   Admit weight: 122# Current weight: 115# I&O's: +1L + generalized edema   Medications reviewed and include: Colace, Miralax, Lasix, 100mg  vitamin B6, 100mg  thiamine Unasyn  Precedex Fentanyl   Labs reviewed:  Na 134 K+ 3.3 Vitamin B6: 1.9 (LOW) Vitamin D: 30.33 (WNL) Vitamin C: 0.3 (LOW) Biotin: <0.05 (LOW) Copper: 76 (LOW)   Diet Order:   Diet Order             Diet NPO time specified  Diet effective now                   EDUCATION NEEDS:  Education needs have been addressed  Skin:  Skin Assessment: Skin Integrity Issues: Skin Integrity Issues:: Stage II Stage II: Left Buttocks  Last BM:  12/11 - type 7 - rectal tube  Height:  Ht Readings from Last 1 Encounters:  03/22/23 5\' 4"  (1.626 m)   Weight:  Wt Readings from Last 1 Encounters:  03/29/23 52.3 kg    BMI:  Body mass index is 19.79 kg/m.  Estimated Nutritional Needs:  Kcal:  1550-1750 Protein:  85-100g Fluid:  1.7L/day    Shelle Iron RD, LDN Contact via Secure Chat.

## 2023-03-29 NOTE — Progress Notes (Signed)
PHARMACY - ANTICOAGULATION CONSULT NOTE  Pharmacy Consult for heparin Indication: acute pulmonary embolus  Allergies  Allergen Reactions   Zoloft [Sertraline Hcl] Anxiety and Other (See Comments)    "crazy feeling"    Patient Measurements: Height: 5\' 4"  (162.6 cm) Weight: 52.3 kg (115 lb 4.8 oz) IBW/kg (Calculated) : 54.7 Heparin Dosing Weight: TBW  Vital Signs: Temp: 98.1 F (36.7 C) (12/11 0344) Temp Source: Axillary (12/11 0344) BP: 133/76 (12/11 0600) Pulse Rate: 77 (12/11 0344)  Labs: Recent Labs    03/27/23 0411 03/27/23 0510 03/28/23 0403 03/28/23 1004 03/29/23 0540  HGB 8.0*  --  8.4*  --  7.8*  HCT 25.8*  --  27.4*  --  25.4*  PLT 570*  --  577*  --  505*  LABPROT  --   --   --  14.9  --   INR  --   --   --  1.2  --   HEPARINUNFRC  --  0.64 0.64  --  0.58  CREATININE 0.59  --   --   --  0.51    Estimated Creatinine Clearance: 54 mL/min (by C-G formula based on SCr of 0.51 mg/dL).   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 Three variants of uncertain significance were detected - one in the AXIN2 gene called c.1829G>A, one in the PMS2 gene called c.964G>A, and one in the RNF43 gene called c.1114C>T. The report date is 06/24/2019.     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 She was transferred to the [facility] ED where CTA chest revealed scattered subsegmental pulmonary emboli throughout the right lung, no RHS. She was intubated in the ED due to lack of improvement on BiPAP. No anticoagulation noted PTA. Pharmacy consulted for heparin dosing.   Significant events: -11/21: LE Doppler: Findings consistent with age indeterminate DVT involving the left peroneal veins.  -11/22: lumbar puncture by IR  -11/30: extubated, 12/2 reintubated, bronch -12/3: extubated, 12/4 re-intubated -12/10: Heparin gtt held ~0900 in preparation for tracheostomy procedure @1300 . Heparin restarted at 1600, per CCM.  Today, 03/29/2023: - Heparin level 0.58, remains therapeutic on 1200 units/hr - CBC: Hgb low/stable at 7.8, Plt remain elevated - No complications of therapy noted, No bleeding reported by RN.  Goal of Therapy:  Heparin level 0.3-0.7 units/ml Monitor platelets by anticoagulation protocol: Yes   Plan:  -Continue heparin infusion at 1200 units/hr -Daily CBC, heparin level -  Monitor closely for s/sx of bleeding    Cherylin Mylar, PharmD Clinical Pharmacist  12/11/20247:30 AM

## 2023-03-29 NOTE — Evaluation (Signed)
Occupational Therapy Evaluation Patient Details Name: Karen [name] MRN: [id number] DOB: 02-17-1951 Today's Date: 03/29/2023   History of Present Illness Patient is a 72 year old female who presented on 11/20 with hypoxia and AMS from scheduled colonoscopy. Patient was provided with narcan and placed on BiPAP. Patient was intubated on 11/21 with sedation and tremors. Patient was extubated on 11/30 onto BiPAP. Patient developed mucus plug on 12/2 with reintubation on 12/4 with bronch. Patient with reduced prece dex. 12/10 patient underwent trach placement. 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  PMH: HTN, major depressive disorder, cognitive impairment, hypothyroidism, arthritis.   Clinical Impression   Patient is a 72 year old female who has had an extended hospital stay with multiple re intubations.  Patient was living at home with husband independently prior level. Currently, patient is TD for ADLs at bed level with following commands inconsistently with increased pain and resistance with minimal movement. Patient also noted to be attempting to pull at NG and trach with cues to redirect multiple times during session. Patients husband reported goal would be to get patient back home if able to when d/c from hospital. Patient will benefit from continued inpatient follow up therapy, <3 hours/day. Patient would continue to benefit from skilled OT services at this time while admitted and after d/c to address noted deficits in order to improve overall safety and independence in ADLs.        If plan is discharge home, recommend the following: Two people to help with walking and/or transfers;Help with stairs or ramp for entrance;Direct supervision/assist for financial management;Assist for transportation;Direct supervision/assist for medications management;Assistance with cooking/housework    Functional Status Assessment  Patient has had a  recent decline in their functional status and/or demonstrates limited ability to make significant improvements in function in a reasonable and predictable amount of time  Equipment Recommendations  None recommended by OT       Precautions / Restrictions Precautions Precautions: Fall Precaution Comments: small bor feeding tube, trach, Restrictions Weight Bearing Restrictions: No      Mobility Bed Mobility               General bed mobility comments: TD to be pulled up in bed.              ADL either performed or assessed with clinical judgement   ADL Overall ADL's : Needs assistance/impaired Eating/Feeding: NPO Eating/Feeding Details (indicate cue type and reason): trach Grooming: Bed level;Total assistance   Upper Body Bathing: Bed level;Total assistance   Lower Body Bathing: Bed level;Total assistance   Upper Body Dressing : Bed level;Total assistance   Lower Body Dressing: Bed level;Total assistance     Toilet Transfer Details (indicate cue type and reason): unable to progress to EOB. placed in chair position at bed level with increased grimancing with pulling up in bed. Toileting- Clothing Manipulation and Hygiene: Bed level;Total assistance         General ADL Comments: patient's husband and friend in room were educated on floating heels with pillows to reduce pressure. friend and husband verbalized understanding. patient unable to communicate with writing or with communication board at this time. SLP made aware. patient attempted to mouth words with decreased ability to follow what patient was attempting to say. patient able to follow commands inconsistently during session. patient's husband asking about patient being able to walk. patients husband was educated on starting with sitting EOB  when patient was ready and increasing  from there. of note patient is still in restraints before and after session with bilateral hand mits as well. needed to be stopped  from pulling at NG tube and trach during session.     Vision Baseline Vision/History: 1 Wears glasses Additional Comments: husband indicated that patient is able to "see really good up close" as indicated by patient holding picture cue card very close to L eye at bed level. patient reported daughter took home glasses and that he would ask her to bring them back.            Pertinent Vitals/Pain Pain Assessment Pain Assessment: Faces Faces Pain Scale: Hurts even more Pain Location: trach site Pain Descriptors / Indicators: Discomfort, Grimacing Pain Intervention(s): Limited activity within patient's tolerance, Monitored during session, Patient requesting pain meds-RN notified, RN gave pain meds during session     Extremity/Trunk Assessment Upper Extremity Assessment Upper Extremity Assessment: LUE deficits/detail;RUE deficits/detail RUE Deficits / Details: patient ROM test limited with trach lines on this side. patient able to move elbow WFL with increased time, hand grip strength 3+/5. RUE: Unable to fully assess due to pain LUE Deficits / Details: patient able to range elbow. unable to pick up shoulder off bed. LUE: Unable to fully assess due to pain   Lower Extremity Assessment Lower Extremity Assessment: Defer to PT evaluation          Cognition Arousal: Alert Behavior During Therapy: Flat affect Overall Cognitive Status: Difficult to assess         General Comments: patient with trach in place attempting to mouth words with inability to distinct one word from the other. in chart noted to have cognitive deficits. patient unable to use picture board to communicate at this time.                Home Living Family/patient expects to be discharged to:: Private residence Living Arrangements: Spouse/significant other Available Help at Discharge: Family;Available PRN/intermittently Type of Home: House Home Access: Stairs to enter Entrance Stairs-Number of Steps: 2    Home Layout: Two level;Able to live on main level with bedroom/bathroom     Bathroom Shower/Tub: Walk-in shower         Home Equipment: Cane - single Librarian, academic (2 wheels)          Prior Functioning/Environment Prior Level of Function : Independent/Modified Independent               ADLs Comments: husband reported that patient was completing ADLs herself and driving prior to admission.        OT Problem List: Decreased activity tolerance;Impaired balance (sitting and/or standing);Decreased coordination;Impaired vision/perception;Decreased cognition;Decreased knowledge of use of DME or AE;Cardiopulmonary status limiting activity;Impaired UE functional use;Increased edema;Pain;Decreased knowledge of precautions;Decreased safety awareness;Decreased range of motion;Decreased strength      OT Treatment/Interventions: Self-care/ADL training;Therapeutic exercise;DME and/or AE instruction;Therapeutic activities;Patient/family education;Balance training    OT Goals(Current goals can be found in the care plan section) Acute Rehab OT Goals Patient Stated Goal: none stated OT Goal Formulation: With family Time For Goal Achievement: 04/12/23 Potential to Achieve Goals: Fair  OT Frequency: Min 2X/week       AM-PAC OT "6 Clicks" Daily Activity     Outcome Measure Help from another person eating meals?: Total (NPO) Help from another person taking care of personal grooming?: Total Help from another person toileting, which includes using toliet, bedpan, or urinal?: Total Help from another person bathing (including washing, rinsing, drying)?: Total Help from another person to  put on and taking off regular upper body clothing?: Total Help from another person to put on and taking off regular lower body clothing?: Total 6 Click Score: 6   End of Session Equipment Utilized During Treatment: Oxygen Nurse Communication: Mobility status;Other (comment) (IV beeping and to check  feeding tube)  Activity Tolerance: Patient limited by fatigue;Patient limited by pain Patient left: in bed;with call bell/phone within reach;with bed alarm set;with family/visitor present;with restraints reapplied  OT Visit Diagnosis: Pain;Muscle weakness (generalized) (M62.81);Other symptoms and signs involving cognitive function                Time:  -    Charges:  OT General Charges $OT Visit: 1 Visit OT Evaluation $OT Eval Moderate Complexity: 1 Mod OT Treatments $Therapeutic Activity: 8-22 mins  Rosalio Loud, MS Acute Rehabilitation Department Office# [phone]   Selinda Flavin 03/29/2023, 4:29 PM

## 2023-03-30 DIAGNOSIS — G9341 Metabolic encephalopathy: Secondary | ICD-10-CM

## 2023-03-30 DIAGNOSIS — J9602 Acute respiratory failure with hypercapnia: Secondary | ICD-10-CM | POA: Diagnosis not present

## 2023-03-30 DIAGNOSIS — I2694 Multiple subsegmental pulmonary emboli without acute cor pulmonale: Secondary | ICD-10-CM | POA: Diagnosis not present

## 2023-03-30 LAB — CBC
HCT: 25.2 % — ABNORMAL LOW (ref 36.0–46.0)
Hemoglobin: 7.6 g/dL — ABNORMAL LOW (ref 12.0–15.0)
MCH: 30.3 pg (ref 26.0–34.0)
MCHC: 30.2 g/dL (ref 30.0–36.0)
MCV: 100.4 fL — ABNORMAL HIGH (ref 80.0–100.0)
Platelets: 473 10*3/uL — ABNORMAL HIGH (ref 150–400)
RBC: 2.51 MIL/uL — ABNORMAL LOW (ref 3.87–5.11)
RDW: 15.6 % — ABNORMAL HIGH (ref 11.5–15.5)
WBC: 11.5 10*3/uL — ABNORMAL HIGH (ref 4.0–10.5)
nRBC: 0 % (ref 0.0–0.2)

## 2023-03-30 LAB — GLUCOSE, CAPILLARY
Glucose-Capillary: 111 mg/dL — ABNORMAL HIGH (ref 70–99)
Glucose-Capillary: 111 mg/dL — ABNORMAL HIGH (ref 70–99)
Glucose-Capillary: 115 mg/dL — ABNORMAL HIGH (ref 70–99)
Glucose-Capillary: 118 mg/dL — ABNORMAL HIGH (ref 70–99)
Glucose-Capillary: 124 mg/dL — ABNORMAL HIGH (ref 70–99)
Glucose-Capillary: 89 mg/dL (ref 70–99)

## 2023-03-30 LAB — HEPARIN LEVEL (UNFRACTIONATED): Heparin Unfractionated: 0.41 [IU]/mL (ref 0.30–0.70)

## 2023-03-30 MED ORDER — BIOTIN 10 MG PO TABS
10.0000 mg | ORAL_TABLET | Freq: Every day | ORAL | Status: DC
  Administered 2023-03-30: 10 mg via ORAL
  Filled 2023-03-30 (×2): qty 1

## 2023-03-30 MED ORDER — FENTANYL CITRATE PF 50 MCG/ML IJ SOSY
50.0000 ug | PREFILLED_SYRINGE | INTRAMUSCULAR | Status: DC | PRN
  Administered 2023-03-30 – 2023-04-05 (×12): 50 ug via INTRAVENOUS
  Filled 2023-03-30 (×15): qty 1

## 2023-03-30 NOTE — TOC Progression Note (Signed)
Transition of Care [facility]) - Progression Note    Patient Details  Name: Karen [name] MRN: [id number] Date of Birth: 02-Sep-1950  [facility] CM/SW Contact  Adrian Prows, RN Phone Number: 03/30/2023, 4:07 PM  Clinical Narrative:    Received follow up call from DJ, Liaison for [facility] regarding pt admission; spoke w/ pt's dtr [name] over phone [phone]); her sister was also present on 3-way; the two agree to pt going to long-term acute care facility; [facility] facility of preference [facility]; they both verbalized that they would not like for pt to [facility]; DJ was notified that facility was not selected; Will [place], Liaison for [facility] notified; he will proceed w/ insurance auth.   Expected Discharge Plan: Home/Self Care Barriers to Discharge: Continued Medical Work up  Expected Discharge Plan and Services   Discharge Planning Services: CM Consult   Living arrangements for the past 2 months: Single Family Home                                       Social Determinants of Health (SDOH) Interventions SDOH Screenings   Food Insecurity: No Food Insecurity (03/09/2023)  Housing: Patient Unable To Answer (03/09/2023)  Transportation Needs: No Transportation Needs (03/09/2023)  Utilities: Not At Risk (03/09/2023)  Depression (PHQ2-9): Low Risk  (03/23/2022)  Tobacco Use: Medium Risk (03/18/2023)    Readmission Risk Interventions    03/09/2023    1:14 PM  Readmission Risk Prevention Plan  Transportation Screening Complete  PCP or Specialist Appt within 5-7 Days Complete  Home Care Screening Complete  Medication Review (RN CM) Complete

## 2023-03-30 NOTE — Evaluation (Signed)
Physical Therapy Evaluation Patient Details Name: Karen [name] MRN: [id number] DOB: [birth year] Today's Date: 03/30/2023  History of Present Illness  Patient is a 72 year old female who presented on 11/20 with hypoxia and AMS from scheduled colonoscopy. Patient was provided with narcan and placed on BiPAP. Patient was intubated on 11/21 with sedation and tremors. Patient was extubated on 11/30 onto BiPAP. Patient developed mucus plug on 12/2 with reintubation on 12/4 with bronch. Patient with reduced prece dex. 12/10 patient underwent trach placement. 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  PMH: HTN, major depressive disorder, cognitive impairment, hypothyroidism, arthritis.  Clinical Impression  Pt admitted with above diagnosis.  Pt currently with functional limitations due to the deficits listed below (see PT Problem List). Pt will benefit from acute skilled PT to increase their independence and safety with mobility to allow discharge.  '    The patient is on 40% FiO2 via trach, VSSS, max RR 30 with   therapy.The patient is awake, at times mouthed a few words, unable to discern.Patient's spouse present.  Patient did follow a few simple directions for moving the legs. Assisted with A/AROM to UEs, taken out of soft restraints to assist, replaced   restraints, mitts in place, also.   Patient may  benefit from [facility] if remains on tracheostomy. Plan to progress activity to dangle on bed  edge as medical/respiratory  status allows.    If plan is discharge home, recommend the following: Two people to help with walking and/or transfers;Two people to help with bathing/dressing/bathroom;Assist for transportation;Help with stairs or ramp for entrance   Can travel by private vehicle        Equipment Recommendations None recommended by PT  Recommendations for Other Services       Functional Status Assessment Patient has had a recent decline  in their functional status and demonstrates the ability to make significant improvements in function in a reasonable and predictable amount of time.     Precautions / Restrictions Precautions Precautions: Fall Precaution Comments: small bor feeding tube, trach, fecal management system Restrictions Weight Bearing Restrictions Per Provider Order: No      Mobility  Bed Mobility               General bed mobility comments: patient postures to the right, quite strong and resists when attempting to  slide  shoulders and trunk back towards midline using the bed pad.    Transfers                   General transfer comment: NT    Ambulation/Gait                  Stairs            Wheelchair Mobility     Tilt Bed    Modified Rankin (Stroke Patients Only)       Balance                                             Pertinent Vitals/Pain Pain Assessment Pain Assessment: PAINAD Breathing: normal Negative Vocalization: none Facial Expression: sad, frightened, frown Body Language: relaxed Consolability: no need to console PAINAD Score: 1    Home Living Family/patient expects to be discharged to:: Private residence Living Arrangements: Spouse/significant other Available Help at Discharge: Family;Available PRN/intermittently Type of  Home: House Home Access: Stairs to enter   [employer] of Steps: 2   Home Layout: Two level;Able to live on main level with bedroom/bathroom Home Equipment: Cane - single point;Rolling Walker (2 wheels)      Prior Function Prior Level of Function : Independent/Modified Independent             Mobility Comments: likes to  walk ADLs Comments: husband reported that patient was completing ADLs herself and driving prior to admission.     Extremity/Trunk Assessment   Upper Extremity Assessment RUE Deficits / Details: assistd with  AAROM, at times resists movement, will relax when  held in position    Lower Extremity Assessment Lower Extremity Assessment: LLE deficits/detail;RLE deficits/detail RLE Deficits / Details: weakly raises  leg from bed ~ 3 ", assisted with heels slides, noted postures ankles in plantarflexion,resists ankle dorsiflexion with noted relaxation with continued pressure to stretch into Dorsiflexion as patient relaxes   plantarflexing LLE Deficits / Details: lifts leg ~ 6' from bed, able to flex the leg  with min assist, postures in plantarflexion, relaxes ankle with  continued pressure into dorsiflexion       Communication   Communication Communication: Tracheostomy Cueing Techniques: Tactile cues;Gestural cues;Verbal cues  Cognition Arousal: Lethargic   Overall Cognitive Status: Difficult to assess                                 General Comments: patient mouthed a few words, intermittently closes eyes,        General Comments General comments (skin integrity, edema, etc.): NT, tilts to the right, Head favors right rotation, assisted to rotate head to the left, does not go beyond  midline. then rotates back to the right.    Exercises     Assessment/Plan    PT Assessment Patient needs continued PT services  PT Problem List Decreased strength;Decreased mobility;Decreased knowledge of precautions;Decreased range of motion;Decreased activity tolerance;Cardiopulmonary status limiting activity;Decreased balance;Decreased knowledge of use of DME;Decreased cognition       PT Treatment Interventions DME instruction;Therapeutic activities;Cognitive remediation;Therapeutic exercise;Patient/family education;Functional mobility training;Balance training    PT Goals (Current goals can be found in the Care Plan section)  Acute Rehab PT Goals Patient Stated Goal: go home PT Goal Formulation: With family Time For Goal Achievement: 04/13/23 Potential to Achieve Goals: Fair    Frequency Min 1X/week     Co-evaluation                AM-PAC PT "6 Clicks" Mobility  Outcome Measure Help needed turning from your back to your side while in a flat bed without using bedrails?: Total Help needed moving from lying on your back to sitting on the side of a flat bed without using bedrails?: Total Help needed moving to and from a bed to a chair (including a wheelchair)?: Total Help needed standing up from a chair using your arms (e.g., wheelchair or bedside chair)?: Total Help needed to walk in hospital room?: Total Help needed climbing 3-5 steps with a railing? : Total 6 Click Score: 6    End of Session   Activity Tolerance: Patient tolerated treatment well Patient left: in bed;with call bell/phone within reach;with bed alarm set;with family/visitor present;with nursing/sitter in room Nurse Communication: Mobility status;Need for lift equipment PT Visit Diagnosis: Unsteadiness on feet (R26.81);Muscle weakness (generalized) (M62.81);Difficulty in walking, not elsewhere classified (R26.2)    Time: 8657-8469 PT Time Calculation (min) (  ACUTE ONLY): 23 min   Charges:   PT Evaluation $PT Eval Low Complexity: 1 Low PT Treatments $Therapeutic Exercise: 8-22 mins PT General Charges $$ ACUTE PT VISIT: 1 Visit         Blanchard Kelch PT Acute Rehabilitation Services Office [phone] Weekend pager-[phone]   Rada Hay 03/30/2023, 1:16 PM

## 2023-03-30 NOTE — Progress Notes (Signed)
NAME:  Karen [name], MRN:  [id number], DOB:  04/08/1951, LOS: 22 ADMISSION DATE:  03/08/2023, CONSULTATION DATE:  03/08/23  REFERRING MD: MD Halivand CHIEF COMPLAINT:  AMS, hypoxia   History of Present Illness:  Pt is a 72 yr old female with a significant pmh of HTN, Hypothyroidism, Major Depressive Disorder, cognitive impairment, arthritis, hearing loss, 30-40lb unintentional weight loss since 05/2022. Per patient's husband and daughter endorse that patient has had a functional and cognitive decline since the beginning of the 2024. Patient has had an extensive workup in regards to cognitive impairment and unintentional weight loss with MRI Brain, MRCPs, CT of chest and abdomen with no definitive etiology.   Patient presented to [facility] ED with AMS and hypoxia (O2 Sats in 70s-80s) by EMS from [facility], who was scheduled for a colonoscopy on 11/20. Patient did not receive colonoscopy due to hypoxia and AMS. Initial VBG revealed pH 7.17 and pCO2 > 123. Patient was given narcan during the initial ED workup with no response and subsequently placed on BIPAP due to increasing hypoxia and respiratory distress. Repeat VBG showed no improvement and patient was intubated due to hypoxia/hypercarbia. Also, CT of chest obtained by EDP showed evidence of scattered subsegmental PE and PCCM was consulted for further vent management as well as management for PE.   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    reports that she quit smoking about 22 years ago. Her smoking use included cigarettes. She started smoking about 32 years ago. She has never used smokeless tobacco.   has a past surgical history that includes Cesarean section; Colonoscopy with propofol (N/A, 11/17/2015); Colonoscopy; and Polypectomy.  Significant Hospital Events: Including procedures, antibiotic start and stop dates in addition to other pertinent events   11/20 Admit with AMS, acute hypoxic/hypercarbic respiratory failure, subsegmental PE intubated No RV strain on CT.  No emphysema reported on CT. PCCM consult EF 70%.  Elevated pulm artery pressures 53.  Right ventricle normal. RVP NEG MRSA PCR NEG 11/21 Intubated, sedated, tremors  11/22 Febrile, tachycardic, shivering/tremors seen with physical touch. IR LP obtained > negative 11/23 Neuro consulted. MRI brain ordered 11/25 TRAC ASPIRATE > RARE MSSA 11/28 weaning but high respiratory rate, pressure support of 10 11/30 extubated.  Requiring BiPAP.  Transfused 1 unit packed red cells 12/2 mucus plug reintubated, bronch with therapeutic aspiration of RUL, LUL, lingula, bronchus intermedius TRACH ASPIRATE RARE MSSA 12/3 CXR improved, extubated 12/4 reintubation after further discussions with family with bronch 12/5 MRI of C-Spine-neg for spinal infection/cord compression, remains on vent.  12/6 patient following commands, on precedex, will need to readdress GOC with patient and patient family  - Intubated/Sedated but responding  GOC iPAL by Dr Chestine Spore 12.7/24 FiO2 30% on the ventilator fentanyl infusion present Precedex infusion.  Ongoing heparin infusion and tube feeds.  Is on Unasyn.  Has Foley catheter.  afebrile with a white count 7000.  +13.75 L since admission. Per RN - family  interested in course of steroids 12/9  Following commands, on fentanyl/precedex gtt  12/10 Bedside trach following commands, on fentanyl gtt  12/11 No issues overnight  12/12 Periods of apnea on SBT this am, stop fentanyl drip   Interim History / Subjective:  Eyes spontaneously open but periods of apnea on SBT  Objective   Blood pressure (!) 105/49, pulse 94, temperature 99.8 F (37.7 C), temperature source Oral, resp. rate (!) 24, height 5\' 4"  (1.626 m), weight 52.9 kg, SpO2 100%.    Vent Mode: PRVC FiO2 (%):  [30 %] 30 % Set Rate:  [24 bmp] 24 bmp Vt Set:  [440 mL] 440 mL PEEP:  [8 cmH20] 8 cmH20 Plateau Pressure:  [18 cmH20-22 cmH20] 19 cmH20   Intake/Output Summary (Last 24 hours) at 03/30/2023 0254 Last data filed at 03/30/2023 0225 Gross per 24 hour  Intake 1817.7 ml  Output 635 ml  Net 1182.7 ml   Filed Weights   03/28/23 0408 03/29/23 0500 03/30/23 0432  Weight: 54.7 kg 52.3 kg 52.9 kg   Physical Exam: General: Acute on chronically ill appearing elderly female lying in bed on mechanical ventilation, in NAD HEENT: Trach midline, MM pink/moist, PERRL,  Neuro: Eyes spontaneously open, unable to follow commands  CV: s1s2 regular rate and rhythm, no murmur, rubs, or gallops,  PULM:  Clear to auscultation, no increased work of breathing, tolerating vent  GI: soft, bowel sounds active in all 4 quadrants, non-tender, non-distended, tolerating TF Extremities: warm/dry, no edema  Skin: no rashes or lesions  Resolved problems:   Fluid Overload - improved  -On 12/10 patient was estimated ~ 8.5 L positive by 12/11 down to ~1L positive, appear net even on exam   Assessment & Plan:  Acute hypoxemic respiratory failure  MSSA VAP -Tracheal aspirate from 12/2 grew Staph Aureus, Vanc/Zosyn stopped on 12/6 transitioned to unasyn  Chronic ongoing aspiration events no s/p percutaneous tracheostomy  P:  Trial of ATC today  Routine trach care  Continue ventilator support with lung  protective strategies  Wean PEEP and FiO2 for sats greater than 90%. Head of bed elevated 30 degrees. Plateau pressures less than 30 cm H20.  Follow intermittent chest x-ray and ABG.   SAT/SBT as tolerated, mentation preclude extubation  Ensure adequate pulmonary hygiene  Follow cultures  VAP bundle in place  PAD protocol Aspiration precautions  Unasyn with stop date in place   Subsegmental PEs  -11/20 Subsegmental Pulmonary Embolisms with LLE DVT  ECHO EF 70-75%  on 11/21 no evidence of RV heart strain  P: Heparin drip until need for PEG tube decision made   Progressive weakness  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  Low vitamin B7, copper, and vitamin C - Secondary to neurocognitive disorder (dementia) vs chronic aspiration  -Extensive workup outpatient with unrevealing results to date including negative LP and MRI C-spine   P: PT/OT as able  Delirium precautions  Continue tube feeds  Supplement thiamine and micronutrients  Optimize electrolytes  Anemia of Critical Illness  P:  Transfuse per protocol  Hgb goal > 7 Heparin drip as above   Hypoglycemia  -CBGs stable with tube feeds P: SSI  CBG goal 140-180 CBG checks q4  HTN  HLD -Episodes of hypotension in setting of sedation  P: Continue telemetry  Continue statin   Hypothyroidism P: Synthroid   Anxiety  Depression  Dementia P: Aricept   Best Practice (right click and "Reselect all SmartList Selections" daily)   Diet/type: TUBE FEEDS DVT prophylaxis: systemic heparin for now Pressure ulcer(s). Stage 2  on Buttocks BL  GI prophylaxis: PPI Lines: N/A Foley:  Yes, and it is still needed Code Status:  full code Last date of multidisciplinary goals of care discussion:  Husband and son updated in regards to tracheostomy procedure. Husband and family are in agreement for tracheostomy scheduled at 1300.   CRITICAL CARE Performed by: Jakye Mullens D. Harris  Total critical care time: 37 minutes  Critical  care time was exclusive of separately billable procedures and treating other patients.  Critical care was necessary to treat or prevent imminent or life-threatening deterioration.  Critical care was time spent personally by me on the following activities: development of treatment plan with patient and/or surrogate as well as nursing, discussions with consultants, evaluation of patient's response to treatment, examination of patient, obtaining history from patient or surrogate, ordering and performing treatments and interventions, ordering and review of laboratory studies, ordering and review of radiographic studies, pulse oximetry and re-evaluation of patient's condition.  Claborn Janusz D. Harris, NP-C [facility] Personal contact information can be found on Amion  If no contact or response made please call [phone] 03/30/2023, 7:27 AM

## 2023-03-30 NOTE — Progress Notes (Signed)
PCCM Progress Note  Spoke to Onalee Hua (spouse) and [name] (daughter) for daily update and to engage in disposition planning. Ultimate goal per family is to progress her enough to be able to go home with as much assistance as insurance will allow. We discussed current limitations in care that would make this plan difficult including; nocturnal vent support, persistent need of artifical nutrition via NG tube, and Precedex drip for agitation.   At that time they would like to see what progress has been made over the weekend (has tolerated ACT trial for the majority of the day) and early next week engage with team to determine disposition plan. Family is open to long term care and skilled nursing options but request more details on options.  Will plan to continue nocturnal vent support to avoid respiratory fatigue over the weekend and see if she can progress to ATC only early next week.   With the above statements in mind [name] would like to continue NG tube for and wait on the decision for PEG tube placement.   Karen Grieshop D. Harris, NP-C [facility] Personal contact information can be found on Amion  If no contact or response made please call [phone] 03/30/2023, 4:02 PM

## 2023-03-30 NOTE — Progress Notes (Signed)
Pt placed on wean, CPAP/PS 5/8.  Pt is tolerating well. Vital signs stable, RN made aware.

## 2023-03-30 NOTE — Progress Notes (Signed)
PHARMACY - ANTICOAGULATION CONSULT NOTE  Pharmacy Consult for heparin Indication: pulmonary embolus and DVT  Allergies  Allergen Reactions   Zoloft [Sertraline Hcl] Anxiety and Other (See Comments)    "crazy feeling"    Patient Measurements: Height: 5\' 4"  (162.6 cm) Weight: 52.9 kg (116 lb 10 oz) IBW/kg (Calculated) : 54.7 Heparin Dosing Weight: TBW  Vital Signs: Temp: 99.4 F (37.4 C) (12/12 0800) Temp Source: Oral (12/12 0800) BP: 105/49 (12/12 0600) Pulse Rate: 92 (12/12 0743)  Labs: Recent Labs    03/28/23 0403 03/28/23 1004 03/29/23 0540 03/30/23 0444  HGB 8.4*  --  7.8* 7.6*  HCT 27.4*  --  25.4* 25.2*  PLT 577*  --  505* 473*  LABPROT  --  14.9  --   --   INR  --  1.2  --   --   HEPARINUNFRC 0.64  --  0.58 0.41  CREATININE  --   --  0.51  --     Estimated Creatinine Clearance: 54.6 mL/min (by C-G formula based on SCr of 0.51 mg/dL).   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 Three variants of uncertain significance were detected - one in the AXIN2 gene called c.1829G>A, one in the PMS2 gene called c.964G>A, and one in the RNF43 gene called c.1114C>T. The report date is 06/24/2019.     The Multi-Cancer Panel offered by [facility] includes sequencing and/or deletion duplication test   GERD (gastroesophageal reflux disease)    History of colonic polyps    Hyperlipidemia    Hypertension    Hypothyroidism    Major depressive  disorder    Osteoarthritis of knee 07/20/2021   Subjective tinnitus of both ears 03/26/2019   Tick bite 08/2015   UTI (urinary tract infection) 10/2015   Assessment: 72 YO female presenting for a colonoscopy procedure 11/20 with SpO2 70-80% and slight shortness of breath. She was transferred to the [facility] ED where CTA chest revealed scattered subsegmental pulmonary emboli throughout the right lung, no RHS. She was intubated in the ED due to lack of improvement on BiPAP. No anticoagulation noted PTA. Pharmacy consulted for heparin dosing.   Significant events: -11/21: LE Doppler: age indeterminate DVT of the left peroneal veins.  -11/22: lumbar puncture by IR  -11/30: extubated, 12/2 reintubated, bronch -12/3: extubated, 12/4 re-intubated -12/10: Heparin gtt held ~0900 in preparation for tracheostomy at 13:00. Heparin restarted at 1600, per CCM.  Today, 03/30/2023: - Heparin level 0.41, remains therapeutic on 1200 units/hr - CBC: Hgb low/stable at 7.6, Plt remain elevated - No complications of therapy noted, No bleeding reported.    Goal of Therapy:  Heparin level 0.3-0.7 units/ml Monitor platelets by anticoagulation protocol: Yes   Plan:  - Continue heparin infusion at 1200 units/hr - Daily CBC, heparin level - Monitor closely for s/sx of bleeding - Follow up plans for PEG tube placement    Lynann Beaver PharmD,  BCPS [facility] main pharmacy [phone] 03/30/2023 8:27 AM

## 2023-03-30 NOTE — Progress Notes (Signed)
Pt placed on ATC per wean protocol for trach pt.  Pt placed on 10L/40%.  HR 95-100, RR 32-37 but comfortable.  Pt suctioned prior for small thick white secretions.  RN at bedside.  Explained wean to daughter at bedside.

## 2023-03-30 NOTE — Progress Notes (Signed)
Pt seen on 40% ATC, HR115+, RR30+ spo2 98%.  Pt placed back on nocturnal vent support per CCMs note/plan for patient.  Pt placed back on previous vent settings, RN aware.

## 2023-03-30 NOTE — Plan of Care (Signed)
  Problem: Clinical Measurements: Goal: Ability to maintain clinical measurements within normal limits will improve Outcome: Progressing   Problem: Nutrition: Goal: Adequate nutrition will be maintained Outcome: Progressing   Problem: Elimination: Goal: Will not experience complications related to urinary retention Outcome: Progressing   Problem: Pain Management: Goal: General experience of comfort will improve Outcome: Progressing   Problem: Safety: Goal: Ability to remain free from injury will improve Outcome: Progressing   Problem: Skin Integrity: Goal: Risk for impaired skin integrity will decrease Outcome: Progressing

## 2023-03-31 DIAGNOSIS — J9602 Acute respiratory failure with hypercapnia: Secondary | ICD-10-CM | POA: Diagnosis not present

## 2023-03-31 LAB — CBC
HCT: 26.1 % — ABNORMAL LOW (ref 36.0–46.0)
Hemoglobin: 8 g/dL — ABNORMAL LOW (ref 12.0–15.0)
MCH: 30.3 pg (ref 26.0–34.0)
MCHC: 30.7 g/dL (ref 30.0–36.0)
MCV: 98.9 fL (ref 80.0–100.0)
Platelets: 477 K/uL — ABNORMAL HIGH (ref 150–400)
RBC: 2.64 MIL/uL — ABNORMAL LOW (ref 3.87–5.11)
RDW: 15.5 % (ref 11.5–15.5)
WBC: 12.1 K/uL — ABNORMAL HIGH (ref 4.0–10.5)
nRBC: 0 % (ref 0.0–0.2)

## 2023-03-31 LAB — GLUCOSE, CAPILLARY
Glucose-Capillary: 110 mg/dL — ABNORMAL HIGH (ref 70–99)
Glucose-Capillary: 123 mg/dL — ABNORMAL HIGH (ref 70–99)
Glucose-Capillary: 112 mg/dL — ABNORMAL HIGH (ref 70–99)
Glucose-Capillary: 138 mg/dL — ABNORMAL HIGH (ref 70–99)
Glucose-Capillary: 98 mg/dL (ref 70–99)

## 2023-03-31 LAB — BASIC METABOLIC PANEL
Anion gap: 5 (ref 5–15)
BUN: 23 mg/dL (ref 8–23)
CO2: 25 mmol/L (ref 22–32)
Calcium: 9.8 mg/dL (ref 8.9–10.3)
Chloride: 109 mmol/L (ref 98–111)
Creatinine, Ser: 0.5 mg/dL (ref 0.44–1.00)
GFR, Estimated: >60 mL/min (ref >60)
Glucose, Bld: 132 mg/dL — ABNORMAL HIGH (ref 70–99)
Potassium: 4.4 mmol/L (ref 3.5–5.1)
Sodium: 139 mmol/L (ref 135–145)

## 2023-03-31 LAB — BLOOD GAS, ARTERIAL
Acid-Base Excess: 0.1 mmol/L (ref 0.0–2.0)
Bicarbonate: 26.7 mmol/L (ref 20.0–28.0)
Drawn by: 20012
FIO2: 28 %
O2 Content: 28 L/min
O2 Saturation: 95.4 %
Patient temperature: 37.1
pCO2 arterial: 53 mm[Hg] — ABNORMAL HIGH (ref 32–48)
pH, Arterial: 7.31 — ABNORMAL LOW (ref 7.35–7.45)
pO2, Arterial: 79 mm[Hg] — ABNORMAL LOW (ref 83–108)

## 2023-03-31 LAB — HEPARIN LEVEL (UNFRACTIONATED): Heparin Unfractionated: 0.52 [IU]/mL (ref 0.30–0.70)

## 2023-03-31 MED ORDER — HYDRALAZINE HCL 20 MG/ML IJ SOLN
10.0000 mg | INTRAMUSCULAR | Status: DC | PRN
  Filled 2023-03-31: qty 1

## 2023-03-31 MED ORDER — HYDRALAZINE HCL 20 MG/ML IJ SOLN
40.0000 mg | Freq: Once | INTRAMUSCULAR | Status: AC
  Administered 2023-03-31: 40 mg via INTRAVENOUS
  Filled 2023-03-31: qty 2

## 2023-03-31 MED ORDER — ZINC OXIDE 40 % EX OINT
TOPICAL_OINTMENT | Freq: Three times a day (TID) | CUTANEOUS | Status: DC | PRN
  Administered 2023-04-04: 1 via TOPICAL
  Filled 2023-03-31: qty 57

## 2023-03-31 MED ORDER — CLONAZEPAM 0.5 MG PO TABS
0.5000 mg | ORAL_TABLET | Freq: Two times a day (BID) | ORAL | Status: DC
  Administered 2023-03-31 – 2023-04-05 (×11): 0.5 mg
  Filled 2023-03-31 (×11): qty 1

## 2023-03-31 MED ORDER — ORAL CARE MOUTH RINSE: 15.0000 mL | OROMUCOSAL | Status: DC | PRN

## 2023-03-31 MED ORDER — CARVEDILOL 12.5 MG PO TABS
12.5000 mg | ORAL_TABLET | Freq: Two times a day (BID) | ORAL | Status: DC
  Administered 2023-04-01 – 2023-04-05 (×9): 12.5 mg
  Filled 2023-03-31 (×9): qty 1

## 2023-03-31 MED ORDER — ORAL CARE MOUTH RINSE
15.0000 mL | OROMUCOSAL | Status: DC
  Administered 2023-03-31 – 2023-04-05 (×52): 15 mL via OROMUCOSAL

## 2023-03-31 MED ORDER — BIOTIN 10 MG PO TABS
10.0000 mg | ORAL_TABLET | Freq: Every day | ORAL | Status: DC
  Administered 2023-03-31 – 2023-04-05 (×6): 10 mg
  Filled 2023-03-31 (×6): qty 1

## 2023-03-31 MED ORDER — IPRATROPIUM-ALBUTEROL 0.5-2.5 (3) MG/3ML IN SOLN
3.0000 mL | Freq: Three times a day (TID) | RESPIRATORY_TRACT | Status: DC
Start: 2023-04-01 — End: 2023-04-03
  Administered 2023-04-01 – 2023-04-03 (×7): 3 mL via RESPIRATORY_TRACT
  Filled 2023-03-31 (×7): qty 3

## 2023-03-31 MED FILL — Biotin Tab 10 MG: ORAL | Qty: 1 | Status: AC

## 2023-03-31 NOTE — Progress Notes (Signed)
NAME:  Karen [name], MRN:  [id number], DOB:  [birth year], LOS: 23 ADMISSION DATE:  03/08/2023, CONSULTATION DATE:  03/08/23  REFERRING MD: MD Halivand CHIEF COMPLAINT:  AMS, hypoxia   History of Present Illness:  Pt is a 72 yr old female with a significant pmh of HTN, Hypothyroidism, Major Depressive Disorder, cognitive impairment, arthritis, hearing loss, 30-40lb unintentional weight loss since 05/2022. Per patient's husband and daughter endorse that patient has had a functional and cognitive decline since the beginning of the 2024. Patient has had an extensive workup in regards to cognitive impairment and unintentional weight loss with MRI Brain, MRCPs, CT of chest and abdomen with no definitive etiology.   Patient presented to [facility] ED with AMS and hypoxia (O2 Sats in 70s-80s) by EMS from [facility], who was scheduled for a colonoscopy on 11/20. Patient did not receive colonoscopy due to hypoxia and AMS. Initial VBG revealed pH 7.17 and pCO2 > 123. Patient was given narcan during the initial ED workup with no response and subsequently placed on BIPAP due to increasing hypoxia and respiratory distress. Repeat VBG showed no improvement and patient was intubated due to hypoxia/hypercarbia. Also, CT of chest obtained by EDP showed evidence of scattered subsegmental PE and PCCM was consulted for further vent management as well as management for PE.   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    reports that she quit smoking about 22 years ago. Her smoking use included cigarettes. She started smoking about 32 years ago. She has never used smokeless tobacco.   has a past surgical history that includes Cesarean section; Colonoscopy with propofol (N/A, 11/17/2015); Colonoscopy; and Polypectomy.  Significant Hospital Events: Including procedures, antibiotic start and stop dates in addition to other pertinent events   11/20 Admit with AMS, acute hypoxic/hypercarbic respiratory failure, subsegmental PE intubated No RV strain on CT.  No emphysema reported on CT. PCCM consult EF 70%.  Elevated pulm artery pressures 53.  Right ventricle normal. RVP NEG MRSA PCR NEG 11/21 Intubated, sedated, tremors  11/22 Febrile, tachycardic, shivering/tremors seen with physical touch. IR LP obtained > negative 11/23 Neuro consulted. MRI brain ordered 11/25 TRAC ASPIRATE > RARE MSSA 11/28 weaning but high respiratory rate, pressure support of 10 11/30 extubated.  Requiring BiPAP.  Transfused 1 unit packed red cells 12/2 mucus plug reintubated, bronch with therapeutic aspiration of RUL, LUL, lingula, bronchus intermedius TRACH ASPIRATE RARE MSSA 12/3 CXR improved, extubated 12/4 reintubation after further discussions with family with bronch 12/5 MRI of C-Spine-neg for spinal infection/cord compression, remains on vent.  12/6 patient following commands, on precedex, will need to readdress GOC with patient and patient family  - Intubated/Sedated but responding  GOC iPAL by Dr Chestine Spore 12.7/24 FiO2 30% on the ventilator fentanyl infusion present Precedex infusion.  Ongoing heparin infusion and tube feeds.  Is on Unasyn.  Has Foley catheter.  afebrile with a white count 7000.  +13.75 L since admission. Per RN - family  interested in course of steroids 12/9  Following commands, on fentanyl/precedex gtt  12/10 Bedside trach following commands, on fentanyl gtt  12/11 No issues overnight  12/12 Periods of apnea on SBT this am, stop fentanyl drip  12/13 working on weaning ATC  Interim History / Subjective:  No distress   Objective   Blood pressure (!) 104/57, pulse 93, temperature (!) 100.9 F (38.3 C), temperature source Oral, resp. rate (!) 24, height 5\' 4"  (1.626 m), weight 51.1 kg, SpO2 100%.    Vent Mode: PRVC FiO2 (%):  [30 %-40 %] 30 % Set Rate:  [24 bmp] 24 bmp Vt Set:  [440 mL] 440 mL PEEP:  [5 cmH20] 5 cmH20 Pressure Support:  [8 cmH20] 8 cmH20 Plateau Pressure:  [17 cmH20-19 cmH20] 18 cmH20   Intake/Output Summary (Last 24 hours) at 03/31/2023 0723 Last data filed at 03/31/2023 0059 Gross per 24 hour  Intake 2412.97 ml  Output 985 ml  Net 1427.97 ml   Filed Weights   03/29/23 0500 03/30/23 0432 03/31/23 0500  Weight: 52.3 kg 52.9 kg 51.1 kg   Physical Exam: General chronically ill appearing 72 year old female resting in bed no distress HENT NCAT trach 6 cuffed.  Pulm Now on ATC RR shallow not appearing labored. Decreased bases Card rrr Abd soft Ext warm  Neuro awake follows commands. Not verbal does not attempt to communicate. No focal def   Resolved problems:   Fluid Overload - improved  Acute hypoxemic respiratory failure  MSSA VAP (completed rx)  Assessment & Plan:   Tracheostomy/vent dependent secondary to ineffective airway clearance, and recurrent aspiration and prob element of neuromuscular weakness Working on weaning Plan Cont ATC as tolerated Will get ABG today while weaning (has rapid shallow rate but does not appear in distress) Will see if we can change vent to PRN VAP bundle  Routine trach care Suture removal of trach on 12/17  Subsegmental PEs  Plan Cont IV heparin  Decide on timing of DOAC   Failure to Thrive w/ Progressive weakness  Malnutrition   Low vitamin B7, copper, and vitamin C - Secondary to neurocognitive disorder (dementia) vs chronic aspiration had extensive outpatient with unrevealing results to date including negative LP and MRI C-spine   Plan PT/OT as able  Continue tube feeds  Supplement thiamine and micronutrients  Optimize electrolytes  Anemia of Critical Illness  No evidence of active bleeding Plan Trend cbc Trigger for transfusion < 7  Hypoglycemia ->improved -CBGs stable with tube feed Plan Trend cbg  HTN  HLD -Episodes of hypotension in setting of sedation Plan Continue telemetry  Continue statin   Hypothyroidism Plan Synthroid   Anxiety  Depression  Dementia Plan Aricept   Best Practice (right click and "Reselect all SmartList Selections" daily)   Diet/type: TUBE FEEDS DVT prophylaxis: systemic heparin for now Pressure ulcer(s). Stage 2 on Buttocks BL  GI prophylaxis: PPI Lines: N/A Foley:  Yes, and it is still needed Code Status:  full code Last date of multidisciplinary goals of care discussion:  Husband and son updated in regards to tracheostomy procedure. Husband and family are in agreement for tracheostomy scheduled at 1300.   CRITICAL CARE Performed by: Shelby Mattocks

## 2023-03-31 NOTE — Progress Notes (Signed)
   03/31/23 1011  Vent Select  Invasive or Noninvasive Invasive  Adult Vent Y  Adult Ventilator Settings  Vent ID [id number]  Vent Type Servo i  Humidity HME  Vent Mode PRVC  Vt Set 440 mL  Set Rate 24 bmp  FiO2 (%) 30 %  I Time 0.91 Sec(s)  PEEP 5 cmH20  Adult Ventilator Measurements  Peak Airway Pressure 21 L/min  Mean Airway Pressure 12 cmH20  Resp Rate Spontaneous 4 br/min  Resp Rate Total 28 br/min  Exhaled Vt 428 mL  Measured Ve 11.8 L  I:E Ratio Measured 1:1.1  Total PEEP 5 cmH20  SpO2 100 %  Adult Ventilator Alarms  Alarms On Y  Ve High Alarm 18 L/min  Ve Low Alarm 4 L/min  Resp Rate High Alarm 38 br/min  Resp Rate Low Alarm 20  PEEP Low Alarm 3 cmH2O  Press High Alarm 40 cmH2O  T Apnea 20 sec(s)  VAP Prevention  HOB> 30 Degrees Y  Breath Sounds  Bilateral Breath Sounds Diminished  Vent Respiratory Assessment  Level of Consciousness Alert  Respiratory Pattern (S)  Labored (Pt was on ATC 28% x 2 hrs, placed back on vent due to ABG results.)  Suction Method  Respiratory Interventions Airway suction;Oral suction  Oral Suctioning/Secretions  Suction Type Oral  Suction Device Catheter  Secretion Amount Small  Secretion Color White  Secretion Consistency Thin  Suction Tolerance Tolerated well  Suctioning Adverse Effects None  Airway Suctioning/Secretions  Suction Type ETT  Suction Device  Catheter  Secretion Amount Small  Secretion Color White  Secretion Consistency Thin  Suction Tolerance Tolerated well  Suctioning Adverse Effects None

## 2023-03-31 NOTE — Progress Notes (Signed)
PHARMACY - ANTICOAGULATION CONSULT NOTE  Pharmacy Consult for heparin Indication: pulmonary embolus and DVT  Allergies  Allergen Reactions   Zoloft [Sertraline Hcl] Anxiety and Other (See Comments)    "crazy feeling"    Patient Measurements: Height: 5\' 4"  (162.6 cm) Weight: 51.1 kg (112 lb 10.5 oz) IBW/kg (Calculated) : 54.7 Heparin Dosing Weight: TBW  Vital Signs: Temp: 100.9 F (38.3 C) (12/13 0400) Temp Source: Oral (12/13 0400) BP: 111/67 (12/13 0700) Pulse Rate: 93 (12/13 0341)  Labs: Recent Labs    03/28/23 1004 03/29/23 0540 03/29/23 0540 03/30/23 0444 03/31/23 0537  HGB  --  7.8*   < > 7.6* 8.0*  HCT  --  25.4*  --  25.2* 26.1*  PLT  --  505*  --  473* 477*  LABPROT 14.9  --   --   --   --   INR 1.2  --   --   --   --   HEPARINUNFRC  --  0.58  --  0.41 0.52  CREATININE  --  0.51  --   --   --    < > = values in this interval not displayed.    Estimated Creatinine Clearance: 52.8 mL/min (by C-G formula based on SCr of 0.51 mg/dL).   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 Three variants of uncertain significance were detected - one in the AXIN2 gene called c.1829G>A, one in the PMS2 gene called c.964G>A, and one in the RNF43 gene called c.1114C>T. The report date is 06/24/2019.     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 She was transferred to the [facility] ED where CTA chest revealed scattered subsegmental pulmonary emboli throughout the right lung, no RHS. She was intubated in the ED due to lack of improvement on BiPAP. No anticoagulation noted PTA. Pharmacy consulted for heparin dosing.   Significant events: -11/21: LE Doppler: age indeterminate DVT of the left peroneal veins.  -11/22: lumbar puncture by IR  -11/30: extubated, 12/2 reintubated, bronch -12/3: extubated, 12/4 re-intubated -12/10: Heparin gtt held ~0900 in preparation for tracheostomy at 13:00. Heparin restarted at 1600, per CCM.  Today, 03/31/2023: - Heparin level 0.52, remains therapeutic on 1200 units/hr - CBC: Hgb low/improved to 8, Plt remain elevated - No complications of therapy noted, No bleeding reported.    Goal of Therapy:  Heparin level 0.3-0.7 units/ml Monitor platelets by anticoagulation protocol: Yes  Plan:  - Continue heparin infusion at 1200 units/hr - Daily CBC, heparin level - Monitor closely for s/sx of bleeding - Follow up plans for PEG tube placement   Lynann Beaver PharmD, BCPS [facility] main pharmacy [phone] 03/31/2023 8:29 AM

## 2023-03-31 NOTE — Progress Notes (Signed)
   03/31/23 0812  Oxygen Therapy/Pulse Ox  O2 Device (S)  Tracheostomy Collar (Placed vent on standby, ATC as tolerated.)   ATC 28%.

## 2023-04-01 DIAGNOSIS — J9602 Acute respiratory failure with hypercapnia: Secondary | ICD-10-CM | POA: Diagnosis not present

## 2023-04-01 LAB — GLUCOSE, CAPILLARY
Glucose-Capillary: 103 mg/dL — ABNORMAL HIGH (ref 70–99)
Glucose-Capillary: 106 mg/dL — ABNORMAL HIGH (ref 70–99)
Glucose-Capillary: 107 mg/dL — ABNORMAL HIGH (ref 70–99)
Glucose-Capillary: 117 mg/dL — ABNORMAL HIGH (ref 70–99)
Glucose-Capillary: 126 mg/dL — ABNORMAL HIGH (ref 70–99)
Glucose-Capillary: 93 mg/dL (ref 70–99)

## 2023-04-01 LAB — CBC
HCT: 26.4 % — ABNORMAL LOW (ref 36.0–46.0)
Hemoglobin: 8 g/dL — ABNORMAL LOW (ref 12.0–15.0)
MCH: 29.9 pg (ref 26.0–34.0)
MCHC: 30.3 g/dL (ref 30.0–36.0)
MCV: 98.5 fL (ref 80.0–100.0)
Platelets: 467 10*3/uL — ABNORMAL HIGH (ref 150–400)
RBC: 2.68 MIL/uL — ABNORMAL LOW (ref 3.87–5.11)
RDW: 15.9 % — ABNORMAL HIGH (ref 11.5–15.5)
WBC: 13.5 10*3/uL — ABNORMAL HIGH (ref 4.0–10.5)
nRBC: 0 % (ref 0.0–0.2)

## 2023-04-01 LAB — HEPARIN LEVEL (UNFRACTIONATED): Heparin Unfractionated: 0.56 [IU]/mL (ref 0.30–0.70)

## 2023-04-01 MED ORDER — APIXABAN 5 MG PO TABS: 5.0000 mg | ORAL_TABLET | Freq: Two times a day (BID) | ORAL | Status: DC

## 2023-04-01 MED ORDER — HYDRALAZINE HCL 20 MG/ML IJ SOLN: 10.0000 mg | Freq: Four times a day (QID) | INTRAMUSCULAR | Status: DC | PRN

## 2023-04-01 MED ORDER — NOREPINEPHRINE 4 MG/250ML-% IV SOLN: 2.0000 ug/min | INTRAVENOUS | Status: DC

## 2023-04-01 MED ORDER — APIXABAN 5 MG PO TABS: 10.0000 mg | ORAL_TABLET | Freq: Two times a day (BID) | ORAL | Status: DC

## 2023-04-01 MED ORDER — SODIUM CHLORIDE 0.9 % IV SOLN: 250.0000 mL | INTRAVENOUS | Status: AC

## 2023-04-01 MED ORDER — APIXABAN 5 MG PO TABS
5.0000 mg | ORAL_TABLET | Freq: Two times a day (BID) | ORAL | Status: DC
  Administered 2023-04-01 – 2023-04-05 (×9): 5 mg
  Filled 2023-04-01 (×9): qty 1

## 2023-04-01 MED ORDER — FENTANYL CITRATE PF 50 MCG/ML IJ SOSY
50.0000 ug | PREFILLED_SYRINGE | Freq: Once | INTRAMUSCULAR | Status: AC
  Administered 2023-04-01: 50 ug via INTRAVENOUS

## 2023-04-01 MED ORDER — FUROSEMIDE 10 MG/ML IJ SOLN
40.0000 mg | Freq: Once | INTRAMUSCULAR | Status: AC
  Administered 2023-04-01: 40 mg via INTRAVENOUS
  Filled 2023-04-01: qty 4

## 2023-04-01 MED ORDER — NOREPINEPHRINE 4 MG/250ML-% IV SOLN
INTRAVENOUS | Status: AC
  Administered 2023-04-01: 2 ug/min via INTRAVENOUS
  Filled 2023-04-01: qty 250

## 2023-04-01 NOTE — Progress Notes (Signed)
   04/01/23 1235  Vent Select  Invasive or Noninvasive Invasive  Adult Vent Y  Tracheostomy Shiley Flexible 6 mm Cuffed  Placement Date/Time: 03/28/23 1323   Placed By: ICU physician  Brand: Shiley Flexible  Size (mm): 6 mm  Style: Cuffed  Status  (see notes at 1144, no changes, clean site.)  Adult Ventilator Settings  Vent Type Servo i  Humidity HME  Vent Mode (S)  PRVC (Pt completed 4 hrs ATC 28%, placed back on vent for rest.)  Vt Set 440 mL  Set Rate 24 bmp  FiO2 (%) 30 %  I Time 0.91 Sec(s)  PEEP 5 cmH20  Adult Ventilator Measurements  Peak Airway Pressure 23 L/min  Mean Airway Pressure 13 cmH20  Plateau Pressure 18 cmH20  Resp Rate Spontaneous 0 br/min  Resp Rate Total 24 br/min  Exhaled Vt 438 mL  Measured Ve 10.7 L  I:E Ratio Measured 1:1.8  Total PEEP 5 cmH20  SpO2 100 %  Adult Ventilator Alarms  Alarms On Y  Ve High Alarm 20 L/min  Ve Low Alarm 4 L/min  Resp Rate High Alarm 38 br/min  Resp Rate Low Alarm 20  PEEP Low Alarm 3 cmH2O  T Apnea 20 sec(s)  VAP Prevention  HOB> 30 Degrees Y  Breath Sounds  Bilateral Breath Sounds Clear;Diminished;Fine crackles  R Upper  Breath Sounds Clear  L Upper Breath Sounds Clear  R Lower Breath Sounds Diminished;Fine crackles  L Lower Breath Sounds Diminished;Fine crackles  Vent Respiratory Assessment  Level of Consciousness Alert  Respiratory Pattern Regular  Patient Tolerance Tolerated well  Suction Method  Respiratory Interventions Oral suction;Airway suction  Oral Suctioning/Secretions  Suction Type Oral  Suction Device Yankauer  Secretion Amount Small  Secretion Color White  Secretion Consistency Thin  Suction Tolerance Tolerated well  Suctioning Adverse Effects None  Airway Suctioning/Secretions  Suction Type ETT  Suction Device  Catheter  Secretion Amount Small  Secretion Color White  Secretion Consistency Thin  Suction Tolerance Tolerated well  Suctioning Adverse Effects None

## 2023-04-01 NOTE — Progress Notes (Signed)
PCCM Progress Note  Called to bedside for persistent hypotension with SBP 60s. However patient awake and follows some commands. Precedex discontinued and remained hypotensive. Obtained verbal consent for A-line however unsuccessful due to inability to adequately sedate patient after Fentanyl pushes 50 mcg x 3 and restarting precedex. BP improved with levophed 2 mcg/h. Updated daughter, [name], regarding current condition. Unclear cause for hypotension. Will work-up with repeat limited echo, repeat blood cultures. She also requested [place] evaluation of patient legs for clots, though less likely will rule out for source of possible infection with prior borderline fever in last 48 hours.

## 2023-04-01 NOTE — Progress Notes (Signed)
PHARMACY - ANTICOAGULATION CONSULT NOTE  Pharmacy Consult for apixaban Indication: pulmonary embolus and DVT  Allergies  Allergen Reactions   Zoloft [Sertraline Hcl] Anxiety and Other (See Comments)    "crazy feeling"   Patient Measurements: Height: 5\' 4"  (162.6 cm) Weight: 53.2 kg (117 lb 4.6 oz) IBW/kg (Calculated) : 54.7 Heparin Dosing Weight: TBW  Vital Signs: Temp: 97.6 F (36.4 C) (12/14 0800) Temp Source: Axillary (12/14 0800) BP: 80/58 (12/14 0915) Pulse Rate: 86 (12/14 0738)  Labs: Recent Labs    03/30/23 0444 03/31/23 0537 03/31/23 1008 04/01/23 0412  HGB 7.6* 8.0*  --  8.0*  HCT 25.2* 26.1*  --  26.4*  PLT 473* 477*  --  467*  HEPARINUNFRC 0.41 0.52  --  0.56  CREATININE  --   --  0.50  --     Estimated Creatinine Clearance: 55 mL/min (by C-G formula based on SCr of 0.5 mg/dL).   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 Three variants of uncertain significance were detected - one in the AXIN2 gene called c.1829G>A, one in the PMS2 gene called c.964G>A, and one in the RNF43 gene called c.1114C>T. The report date is 06/24/2019.     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 She was transferred to the [facility] ED where CTA chest revealed scattered subsegmental pulmonary emboli throughout the right lung, no RHS. She was intubated in the ED due to lack of improvement on BiPAP. No anticoagulation noted PTA. Pharmacy consulted for heparin dosing initially. No procedures planned so switching over to Eliquis.  Significant events: -11/21: LE Doppler: age indeterminate DVT of the left peroneal veins.  -11/22: lumbar puncture by IR  -11/30: extubated, 12/2 reintubated, bronch -12/3: extubated, 12/4 re-intubated -12/10: Heparin gtt held ~0900 in preparation for tracheostomy at 13:00. Heparin restarted at 1600, per CCM.  Today, 04/01/2023: Has been therapeutic on heparin for well over a week.  - CBC: Hgb low/improved to 8, Plt remain elevated - No complications of therapy noted, No bleeding reported.    Goal of Therapy:  Heparin level 0.3-0.7 units/ml Monitor platelets by anticoagulation protocol: Yes   Plan:  Start apixaban 5mg  per tube BID Stop heparin infusion when first dose of apixaban given Monitor CBC, s/s of bleed Will have patient advocate check copay for apixaban when able  Enzo Bi, PharmD, BCPS, BCIDP Clinical Pharmacist 04/01/2023 9:29 AM

## 2023-04-01 NOTE — Plan of Care (Signed)
  Problem: Education: Goal: Knowledge of General Education information will improve Description: Including pain rating scale, medication(s)/side effects and non-pharmacologic comfort measures Outcome: Progressing   Problem: Clinical Measurements: Goal: Ability to maintain clinical measurements within normal limits will improve Outcome: Progressing Goal: Diagnostic test results will improve Outcome: Progressing   Problem: Nutrition: Goal: Adequate nutrition will be maintained Outcome: Progressing   Problem: Elimination: Goal: Will not experience complications related to urinary retention Outcome: Progressing   Problem: Health Behavior/Discharge Planning: Goal: Ability to manage tracheostomy will improve Outcome: Progressing   Problem: Respiratory: Goal: Patent airway maintenance will improve Outcome: Progressing   Problem: Role Relationship: Goal: Ability to communicate will improve Outcome: Progressing

## 2023-04-01 NOTE — Progress Notes (Signed)
PHARMACY - ANTICOAGULATION CONSULT NOTE  Pharmacy Consult for heparin Indication: pulmonary embolus and DVT  Allergies  Allergen Reactions   Zoloft [Sertraline Hcl] Anxiety and Other (See Comments)    "crazy feeling"    Patient Measurements: Height: 5\' 4"  (162.6 cm) Weight: 53.2 kg (117 lb 4.6 oz) IBW/kg (Calculated) : 54.7 Heparin Dosing Weight: TBW  Vital Signs: Temp: 99.1 F (37.3 C) (12/14 0010) Temp Source: Axillary (12/14 0010) BP: 88/53 (12/14 0333) Pulse Rate: 81 (12/14 0333)  Labs: Recent Labs    03/29/23 0540 03/30/23 0444 03/31/23 0537 03/31/23 1008 04/01/23 0412  HGB 7.8* 7.6* 8.0*  --  8.0*  HCT 25.4* 25.2* 26.1*  --  26.4*  PLT 505* 473* 477*  --  467*  HEPARINUNFRC 0.58 0.41 0.52  --  0.56  CREATININE 0.51  --   --  0.50  --     Estimated Creatinine Clearance: 55 mL/min (by C-G formula based on SCr of 0.5 mg/dL).   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 Three variants of uncertain significance were detected - one in the AXIN2 gene called c.1829G>A, one in the PMS2 gene called c.964G>A, and one in the RNF43 gene called c.1114C>T. The report date is 06/24/2019.     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 She was transferred to the [facility] ED where CTA chest revealed scattered subsegmental pulmonary emboli throughout the right lung, no RHS. She was intubated in the ED due to lack of improvement on BiPAP. No anticoagulation noted PTA. Pharmacy consulted for heparin dosing.   Significant events: -11/21: LE Doppler: age indeterminate DVT of the left peroneal veins.  -11/22: lumbar puncture by IR  -11/30: extubated, 12/2 reintubated, bronch -12/3: extubated, 12/4 re-intubated -12/10: Heparin gtt held ~0900 in preparation for tracheostomy at 13:00. Heparin restarted at 1600, per CCM.  Today, 04/01/2023: - Heparin level 0.56, remains therapeutic on 1200 units/hr - CBC: Hgb low/improved to 8, Plt remain elevated - No complications of therapy noted, No bleeding reported.    Goal of Therapy:  Heparin level 0.3-0.7 units/ml Monitor platelets by anticoagulation protocol: Yes   Plan:  - Continue heparin infusion at 1200 units/hr - Daily CBC, heparin level - Monitor closely for s/sx of bleeding - Follow up plans for PEG tube placement   Arley Phenix RPh 04/01/2023, 5:08 AM

## 2023-04-01 NOTE — Progress Notes (Signed)
   04/01/23 0829  Adult Ventilator Settings  Vent Type (S)   (Placed vent on standby: Pt is now on ATC 28%, wean as tolerated.)   Sp02: 97% ATC 28%.

## 2023-04-01 NOTE — Progress Notes (Signed)
When this nurse entered the pt room at shift change, the BP was 195/101. This nurse repositioned the pt and the cuff then recycled the BP which was 198/100.  This nurse then attempted to arouse the pt who was having muscle spasms in her legs and R side of face. The patient would not follow commands nor would she open her eyes.  This nurse titrated the precedex according to the orders and sternal rubbed the patient until her eyes fluttered. Still no eye opening/contact or following commands.  This nurse called the elink nurse to request something for the BP and to update regarding the unarousability and titration of the precedex.  This nurse administered the ordered 40 mg hydralazine which brought the pt BP to 89/35. This nurse and the patient's caregiver were at this time able to arouse the pt enough to follow commands and sustain eye contact.   Repositioning and arousing the pt resulted in a BP of 106/52.

## 2023-04-01 NOTE — Progress Notes (Signed)
NAME:  Karen [name], MRN:  [id number], DOB:  Sep 18, 1950, LOS: 24 ADMISSION DATE:  03/08/2023, CONSULTATION DATE:  03/08/23  REFERRING MD: MD Halivand CHIEF COMPLAINT:  AMS, hypoxia   History of Present Illness:  Pt is a 72 yr old female with a significant pmh of HTN, Hypothyroidism, Major Depressive Disorder, cognitive impairment, arthritis, hearing loss, 30-40lb unintentional weight loss since 05/2022. Per patient's husband and daughter endorse that patient has had a functional and cognitive decline since the beginning of the 2024. Patient has had an extensive workup in regards to cognitive impairment and unintentional weight loss with MRI Brain, MRCPs, CT of chest and abdomen with no definitive etiology.   Patient presented to [facility] ED with AMS and hypoxia (O2 Sats in 70s-80s) by EMS from [facility], who was scheduled for a colonoscopy on 11/20. Patient did not receive colonoscopy due to hypoxia and AMS. Initial VBG revealed pH 7.17 and pCO2 > 123. Patient was given narcan during the initial ED workup with no response and subsequently placed on BIPAP due to increasing hypoxia and respiratory distress. Repeat VBG showed no improvement and patient was intubated due to hypoxia/hypercarbia. Also, CT of chest obtained by EDP showed evidence of scattered subsegmental PE and PCCM was consulted for further vent management as well as management for PE.   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    reports that she quit smoking about 22 years ago. Her smoking use included cigarettes. She started smoking about 32 years ago. She has never used smokeless tobacco.   has a past surgical history that includes Cesarean section; Colonoscopy with propofol (N/A, 11/17/2015); Colonoscopy; and Polypectomy.  Significant Hospital Events: Including procedures, antibiotic start and stop dates in addition to other pertinent events   11/20 Admit with AMS, acute hypoxic/hypercarbic respiratory failure, subsegmental PE intubated No RV strain on CT.  No emphysema reported on CT. PCCM consult EF 70%.  Elevated pulm artery pressures 53.  Right ventricle normal. RVP NEG MRSA PCR NEG 11/21 Intubated, sedated, tremors  11/22 Febrile, tachycardic, shivering/tremors seen with physical touch. IR LP obtained > negative 11/23 Neuro consulted. MRI brain ordered 11/25 TRAC ASPIRATE > RARE MSSA 11/28 weaning but high respiratory rate, pressure support of 10 11/30 extubated.  Requiring BiPAP.  Transfused 1 unit packed red cells 12/2 mucus plug reintubated, bronch with therapeutic aspiration of RUL, LUL, lingula, bronchus intermedius TRACH ASPIRATE RARE MSSA 12/3 CXR improved, extubated 12/4 reintubation after further discussions with family with bronch 12/5 MRI of C-Spine-neg for spinal infection/cord compression, remains on vent.  12/6 patient following commands, on precedex, will need to readdress GOC with patient and patient family  - Intubated/Sedated but responding  GOC iPAL by Dr Chestine Spore 12.7/24 FiO2 30% on the ventilator fentanyl infusion present Precedex infusion.  Ongoing heparin infusion and tube feeds.  Is on Unasyn.  Has Foley catheter.  afebrile with a white count 7000.  +13.75 L since admission. Per RN - family  interested in course of steroids 12/9  Following commands, on fentanyl/precedex gtt  12/10 Bedside trach following commands, on fentanyl gtt  12/11 No issues overnight  12/12 Periods of apnea on SBT this am, stop fentanyl drip. Tolerated TC x 9 hours 12/13 working on weaning ATC.  Interim History / Subjective:  Tolerated trach collar 9 hours on 12/12 and 7 hours on 12/13  Objective   Blood pressure (!) 85/48, pulse 81, temperature 99.1 F (37.3 C), temperature source Axillary, resp. rate (!) 24, height 5\' 4"  (1.626 m), weight 53.2 kg, SpO2 100%.    Vent Mode: PRVC FiO2 (%):  [28 %-30 %] 30 % Set Rate:  [24 bmp] 24 bmp Vt Set:  [440 mL] 440 mL PEEP:  [5 cmH20] 5 cmH20 Plateau Pressure:  [18 cmH20-19 cmH20] 19 cmH20   Intake/Output Summary (Last 24 hours) at 04/01/2023 0724 Last data filed at 04/01/2023 7829 Gross per 24 hour  Intake 1454.72 ml  Output 712 ml  Net 742.72 ml   Filed Weights   03/30/23 0432 03/31/23 0500 04/01/23 0426  Weight: 52.9 kg 51.1 kg 53.2 kg   Physical Exam: General: Chronically ill-appearing, no acute distress HENT: [place], AT, ETT in place Eyes: EOMI, no scleral icterus Respiratory: Clear to auscultation bilaterally.  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 Adequate ventilation and oxygenation VAP bundle  Routine trach care Suture removal of trach on 12/17 Diurese lasix 40 mg once  Subsegmental PEs  Plan IV heparin Consider DOAC if no pending  procedures  Failure to Thrive w/ Progressive weakness  Malnutrition  Low vitamin B7, copper, and vitamin C - Secondary to neurocognitive disorder (dementia) vs chronic aspiration had extensive outpatient with unrevealing results to date including negative LP and MRI C-spine   Plan PT/OT as able  Continue tube feeds  Supplement thiamine and micronutrients  Optimize electrolytes  Anemia of Critical Illness  No evidence of active bleeding Plan Trend cbc Trigger for transfusion < 7  Hypoglycemia ->improved -CBGs stable with tube feed Plan Trend cbg  HTN  HLD -Episodes of hypotension in setting of sedation Plan Continue telemetry  Continue statin  Coreg  Hypothyroidism Plan Synthroid   Anxiety  Depression  Dementia Plan Wean Precedex Aricept  Klonpin 12/13  Best Practice (right click and "Reselect all SmartList Selections" daily)   Diet/type: TUBE FEEDS DVT prophylaxis: systemic heparin for now Pressure ulcer(s). Stage 2 on Buttocks BL  GI prophylaxis: PPI Lines: N/A Foley:  Yes, and it is still needed Code Status:  full code Last date of multidisciplinary goals of care discussion: 12/10  The patient is critically ill with multiple organ systems failure and requires high complexity decision making for assessment and support, frequent evaluation and titration of therapies, application of advanced monitoring technologies and extensive interpretation of multiple databases.  Independent Critical Care Time: 36 Minutes.   Mechele Collin, M.D. [facility] Pulmonary/Critical Care Medicine 04/01/2023 7:25 AM   Please see Amion for pager number to reach on-call Pulmonary and Critical Care Team.

## 2023-04-02 ENCOUNTER — Inpatient Hospital Stay (HOSPITAL_COMMUNITY): Payer: Medicare HMO

## 2023-04-02 DIAGNOSIS — R578 Other shock: Secondary | ICD-10-CM | POA: Diagnosis not present

## 2023-04-02 DIAGNOSIS — I959 Hypotension, unspecified: Secondary | ICD-10-CM | POA: Diagnosis not present

## 2023-04-02 DIAGNOSIS — R509 Fever, unspecified: Secondary | ICD-10-CM

## 2023-04-02 DIAGNOSIS — J9602 Acute respiratory failure with hypercapnia: Secondary | ICD-10-CM | POA: Diagnosis not present

## 2023-04-02 LAB — GLUCOSE, CAPILLARY
Glucose-Capillary: 128 mg/dL — ABNORMAL HIGH (ref 70–99)
Glucose-Capillary: 133 mg/dL — ABNORMAL HIGH (ref 70–99)
Glucose-Capillary: 121 mg/dL — ABNORMAL HIGH (ref 70–99)
Glucose-Capillary: 103 mg/dL — ABNORMAL HIGH (ref 70–99)
Glucose-Capillary: 116 mg/dL — ABNORMAL HIGH (ref 70–99)

## 2023-04-02 LAB — BASIC METABOLIC PANEL
Anion gap: 8 (ref 5–15)
BUN: 28 mg/dL — ABNORMAL HIGH (ref 8–23)
CO2: 19 mmol/L — ABNORMAL LOW (ref 22–32)
Calcium: 9.2 mg/dL (ref 8.9–10.3)
Chloride: 109 mmol/L (ref 98–111)
Creatinine, Ser: 0.42 mg/dL — ABNORMAL LOW (ref 0.44–1.00)
GFR, Estimated: >60 mL/min (ref >60)
Glucose, Bld: 111 mg/dL — ABNORMAL HIGH (ref 70–99)
Potassium: 3.4 mmol/L — ABNORMAL LOW (ref 3.5–5.1)
Sodium: 136 mmol/L (ref 135–145)

## 2023-04-02 LAB — ECHOCARDIOGRAM LIMITED
Height: 64 in
S' Lateral: 2.3 cm
Weight: 1813.06 [oz_av]

## 2023-04-02 MED ORDER — POTASSIUM CHLORIDE CRYS ER 20 MEQ PO TBCR
40.0000 meq | EXTENDED_RELEASE_TABLET | Freq: Once | ORAL | Status: DC
Start: 2023-04-02 — End: 2023-04-02

## 2023-04-02 MED ORDER — DEXMEDETOMIDINE HCL IN NACL 400 MCG/100ML IV SOLN
0.4000 ug/kg/h | INTRAVENOUS | Status: DC
  Administered 2023-04-02 (×3): 1.1 ug/kg/h via INTRAVENOUS
  Administered 2023-04-03: 0.8 ug/kg/h via INTRAVENOUS
  Administered 2023-04-03: 1.1 ug/kg/h via INTRAVENOUS
  Administered 2023-04-04: 0.5 ug/kg/h via INTRAVENOUS
  Filled 2023-04-02: qty 200
  Filled 2023-04-02 (×5): qty 100

## 2023-04-02 MED ORDER — POTASSIUM CHLORIDE 20 MEQ PO PACK
40.0000 meq | PACK | Freq: Once | ORAL | Status: AC
  Administered 2023-04-02: 40 meq via ORAL
  Filled 2023-04-02: qty 2

## 2023-04-02 MED ORDER — OXYCODONE HCL 5 MG/5ML PO SOLN
5.0000 mg | ORAL | Status: DC | PRN
  Administered 2023-04-02 – 2023-04-05 (×10): 5 mg
  Filled 2023-04-02 (×10): qty 5

## 2023-04-02 MED ORDER — FUROSEMIDE 10 MG/ML IJ SOLN
40.0000 mg | Freq: Once | INTRAMUSCULAR | Status: AC
  Administered 2023-04-02: 40 mg via INTRAVENOUS
  Filled 2023-04-02: qty 4

## 2023-04-02 NOTE — Progress Notes (Signed)
Echocardiogram 2D Echocardiogram has been performed.  Karen [name] 04/02/2023, 9:23 AM

## 2023-04-02 NOTE — Plan of Care (Signed)
  Problem: Clinical Measurements: Goal: Ability to maintain clinical measurements within normal limits will improve Outcome: Progressing Goal: Diagnostic test results will improve Outcome: Progressing Goal: Respiratory complications will improve Outcome: Progressing Goal: Cardiovascular complication will be avoided Outcome: Progressing   Problem: Elimination: Goal: Will not experience complications related to urinary retention Outcome: Progressing   Problem: Education: Goal: Knowledge about tracheostomy care/management will improve Outcome: Progressing   Problem: Health Behavior/Discharge Planning: Goal: Ability to manage tracheostomy will improve Outcome: Progressing   Problem: Respiratory: Goal: Patent airway maintenance will improve Outcome: Progressing Note: Off ventilator and on trach collar for 8 hours today.

## 2023-04-02 NOTE — Progress Notes (Signed)
   04/02/23 1553  Adult Ventilator Settings  Vent Mode (S)  PRVC (Pt completed 8 hrs. ATC 28%, placed back on vent due to fatigue.)

## 2023-04-02 NOTE — TOC Progression Note (Addendum)
Transition of Care [facility]) - Progression Note    Patient Details  Name: Karen [name] MRN: [id number] Date of Birth: 02/26/1951  Transition of Care [facility]) CM/SW Contact  Darleene Cleaver, [place] Phone Number: 04/02/2023, 12:32 PM  Clinical Narrative:     CSW left a message with Will [place], [phone], at [facility], to get an update on insurance authorization.  CSW awaiting for a call back.  2:40pm Per Will auth is still pending, but they should have a decision tomorrow.  CSW provided weekday [facility] worker's name and number for him to follow up with.  Expected Discharge Plan: Home/Self Care Barriers to Discharge: Continued Medical Work up  Expected Discharge Plan and Services   Discharge Planning Services: CM Consult   Living arrangements for the past 2 months: Single Family Home                                       Social Determinants of Health (SDOH) Interventions SDOH Screenings   Food Insecurity: No Food Insecurity (03/09/2023)  Housing: Patient Unable To Answer (03/09/2023)  Transportation Needs: No Transportation Needs (03/09/2023)  Utilities: Not At Risk (03/09/2023)  Depression (PHQ2-9): Low Risk  (03/23/2022)  Tobacco Use: Medium Risk (03/18/2023)    Readmission Risk Interventions    03/09/2023    1:14 PM  Readmission Risk Prevention Plan  Transportation Screening Complete  PCP or Specialist Appt within 5-7 Days Complete  Home Care Screening Complete  Medication Review (RN CM) Complete

## 2023-04-02 NOTE — Progress Notes (Signed)
   04/02/23 0839  Vent [facility]  Adult Vent (S)   (Standby: Placed pt on 28% ATC, 99% Sp02, goal at least 8 hrs. or more as tolerated.)  Adult Ventilator Measurements  SpO2 95 %

## 2023-04-02 NOTE — Progress Notes (Signed)
NAME:  Karen [name], MRN:  [id number], DOB:  1950-08-20, LOS: 25 ADMISSION DATE:  03/08/2023, CONSULTATION DATE:  03/08/23  REFERRING MD: MD Halivand CHIEF COMPLAINT:  AMS, hypoxia   History of Present Illness:  Pt is a 72 yr old female with a significant pmh of HTN, Hypothyroidism, Major Depressive Disorder, cognitive impairment, arthritis, hearing loss, 30-40lb unintentional weight loss since 05/2022. Per patient's husband and daughter endorse that patient has had a functional and cognitive decline since the beginning of the 2024. Patient has had an extensive workup in regards to cognitive impairment and unintentional weight loss with MRI Brain, MRCPs, CT of chest and abdomen with no definitive etiology.   Patient presented to [facility] ED with AMS and hypoxia (O2 Sats in 70s-80s) by EMS from [facility], who was scheduled for a colonoscopy on 11/20. Patient did not receive colonoscopy due to hypoxia and AMS. Initial VBG revealed pH 7.17 and pCO2 > 123. Patient was given narcan during the initial ED workup with no response and subsequently placed on BIPAP due to increasing hypoxia and respiratory distress. Repeat VBG showed no improvement and patient was intubated due to hypoxia/hypercarbia. Also, CT of chest obtained by EDP showed evidence of scattered subsegmental PE and PCCM was consulted for further vent management as well as management for PE.   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    reports that she quit smoking about 22 years ago. Her smoking use included cigarettes. She started smoking about 32 years ago. She has never used smokeless tobacco.   has a past surgical history that includes Cesarean section; Colonoscopy with propofol (N/A, 11/17/2015); Colonoscopy; and Polypectomy.  Significant Hospital Events: Including procedures, antibiotic start and stop dates in addition to other pertinent events   11/20 Admit with AMS, acute hypoxic/hypercarbic respiratory failure, subsegmental PE intubated No RV strain on CT.  No emphysema reported on CT. PCCM consult EF 70%.  Elevated pulm artery pressures 53.  Right ventricle normal. RVP NEG MRSA PCR NEG 11/21 Intubated, sedated, tremors  11/22 Febrile, tachycardic, shivering/tremors seen with physical touch. IR LP obtained > negative 11/23 Neuro consulted. MRI brain ordered 11/25 TRAC ASPIRATE > RARE MSSA 11/28 weaning but high respiratory rate, pressure support of 10 11/30 extubated.  Requiring BiPAP.  Transfused 1 unit packed red cells 12/2 mucus plug reintubated, bronch with therapeutic aspiration of RUL, LUL, lingula, bronchus intermedius TRACH ASPIRATE RARE MSSA 12/3 CXR improved, extubated 12/4 reintubation after further discussions with family with bronch 12/5 MRI of C-Spine-neg for spinal infection/cord compression, remains on vent.  12/6 patient following commands, on precedex, will need to readdress GOC with patient and patient family  - Intubated/Sedated but responding  GOC iPAL by Dr Chestine Spore 12.7/24 FiO2 30% on the ventilator fentanyl infusion present Precedex infusion.  Ongoing heparin infusion and tube feeds.  Is on Unasyn.  Has Foley catheter.  afebrile with a white count 7000.  +13.75 L since admission. Per RN - family  interested in course of steroids 12/9  Following commands, on fentanyl/precedex gtt  12/10 Bedside trach following commands, on fentanyl gtt  12/11 No issues overnight  12/12 Periods of apnea on SBT this am, stop fentanyl drip. Tolerated TC x 9 hours 12/13 -12/15 Tolerated ATC  Interim History / Subjective:  Tolerated ATC 4 hours yesterday Episode of hypotension that briefly required levophed however patient had unchanged mentation. Weaned off levophed and hemodynamically stable this am  Objective   Blood pressure (!) 121/49, pulse 75, temperature 98.1 F (36.7 C), temperature source Oral, resp. rate 19, height 5\' 4"  (1.626 m), weight 51.4 kg, SpO2 100%.    Vent Mode: PRVC FiO2 (%):  [28 %-30 %] 30 % Set Rate:  [24 bmp] 24 bmp Vt Set:  [440 mL] 440 mL PEEP:  [5 cmH20] 5 cmH20 Plateau Pressure:  [15 cmH20-19 cmH20] 18 cmH20   Intake/Output Summary (Last 24 hours) at 04/02/2023 1012 Last data filed at 04/02/2023 0959 Gross per 24 hour  Intake 1239.38 ml  Output 1781 ml  Net -541.62 ml   Filed Weights   03/31/23 0500 04/01/23 0426 04/02/23 0500  Weight: 51.1 kg 53.2 kg 51.4 kg   Physical Exam: General: Chronically ill-appearing, no acute distress HENT: Brush, AT, OP clear, MMM Neck: Trach #6 in place, c/d/i Eyes: EOMI, no scleral icterus Respiratory: Clear to auscultation bilaterally.  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 Possible medication related however tolerating precedex. May be diuresis related. Plan Off pressors F/u blood cultures F/u echo  Subsegmental PEs  Plan Eliquis F/u LE dopplers  Failure to Thrive w/ Progressive weakness  Malnutrition  Low vitamin B7, copper, and vitamin C - Secondary to neurocognitive disorder (dementia) vs chronic aspiration had extensive outpatient with unrevealing results to date including negative LP and MRI C-spine   Plan PT/OT as able  Continue tube feeds  Supplement thiamine and micronutrients  Optimize electrolytes  Hypokalemia Replete  Anemia of Critical Illness  No evidence of active bleeding Plan Trend cbc Trigger for transfusion < 7  Hypoglycemia ->improved -CBGs stable with tube feed Plan Trend cbg  HTN  HLD -Episodes of hypotension in setting of sedation Plan Continue telemetry  Continue statin  Coreg  Hypothyroidism Plan Synthroid   Anxiety  Depression  Dementia Plan Wean Precedex Aricept  Klonpin 12/13 Oxycodone 5 mg q4h PRN  Best Practice (right click and "Reselect all SmartList Selections" daily)   Diet/type: TUBE FEEDS DVT prophylaxis: DOAC  Pressure ulcer(s). Stage 2 on Buttocks BL  GI prophylaxis: PPI Lines: N/A Foley:  Yes, and it is still needed Code Status:  full code Last date of multidisciplinary goals of care discussion: 12/10  The patient is critically ill with multiple organ systems failure and requires high complexity decision making for assessment and support, frequent evaluation and titration of therapies, application of advanced monitoring technologies and extensive interpretation of multiple databases.  Independent Critical Care Time: 32 Minutes.   Mechele Collin, M.D.  [facility] 04/02/2023 10:12 AM   Please see Amion for pager number to reach on-call Pulmonary and  Critical Care Team.

## 2023-04-02 NOTE — Plan of Care (Signed)
  Problem: Clinical Measurements: Goal: Ability to maintain clinical measurements within normal limits will improve Outcome: Progressing Goal: Will remain free from infection Outcome: Progressing Goal: Diagnostic test results will improve Outcome: Progressing Goal: Respiratory complications will improve Outcome: Progressing Goal: Cardiovascular complication will be avoided Outcome: Progressing   Problem: Activity: Goal: Risk for activity intolerance will decrease Outcome: Progressing   Problem: Nutrition: Goal: Adequate nutrition will be maintained Outcome: Progressing   Problem: Coping: Goal: Level of anxiety will decrease Outcome: Progressing   Problem: Pain Management: Goal: General experience of comfort will improve Outcome: Progressing

## 2023-04-02 NOTE — Progress Notes (Signed)
eLink Physician-Brief Progress Note Patient Name: Karen [name] DOB: Apr 05, 1951 MRN: [id number]   Date of Service  04/02/2023  HPI/Events of Note  Patient has nonspecific diffuse pain and has been difficult to sedate throughout the day.  Received multiple doses of fentanyl throughout the day.  Has a feeding tube in place.  Requesting pain meds.  eICU Interventions  Add oxycodone as needed through feeding tube     Intervention Category Intermediate Interventions: Pain - evaluation and management  Karen [name] 04/02/2023, 4:30 AM

## 2023-04-03 ENCOUNTER — Telehealth (HOSPITAL_COMMUNITY): Payer: Self-pay

## 2023-04-03 ENCOUNTER — Other Ambulatory Visit (HOSPITAL_COMMUNITY): Payer: Self-pay

## 2023-04-03 DIAGNOSIS — J189 Pneumonia, unspecified organism: Secondary | ICD-10-CM

## 2023-04-03 DIAGNOSIS — I959 Hypotension, unspecified: Secondary | ICD-10-CM | POA: Diagnosis not present

## 2023-04-03 DIAGNOSIS — Z9911 Dependence on respirator [ventilator] status: Secondary | ICD-10-CM

## 2023-04-03 DIAGNOSIS — I2699 Other pulmonary embolism without acute cor pulmonale: Secondary | ICD-10-CM | POA: Diagnosis not present

## 2023-04-03 LAB — GLUCOSE, CAPILLARY
Glucose-Capillary: 101 mg/dL — ABNORMAL HIGH (ref 70–99)
Glucose-Capillary: 110 mg/dL — ABNORMAL HIGH (ref 70–99)
Glucose-Capillary: 111 mg/dL — ABNORMAL HIGH (ref 70–99)
Glucose-Capillary: 116 mg/dL — ABNORMAL HIGH (ref 70–99)
Glucose-Capillary: 142 mg/dL — ABNORMAL HIGH (ref 70–99)
Glucose-Capillary: 85 mg/dL (ref 70–99)

## 2023-04-03 LAB — BASIC METABOLIC PANEL
Anion gap: 8 (ref 5–15)
BUN: 32 mg/dL — ABNORMAL HIGH (ref 8–23)
CO2: 20 mmol/L — ABNORMAL LOW (ref 22–32)
Calcium: 9.5 mg/dL (ref 8.9–10.3)
Chloride: 107 mmol/L (ref 98–111)
Creatinine, Ser: 0.43 mg/dL — ABNORMAL LOW (ref 0.44–1.00)
GFR, Estimated: >60 mL/min (ref >60)
Glucose, Bld: 108 mg/dL — ABNORMAL HIGH (ref 70–99)
Potassium: 3.6 mmol/L (ref 3.5–5.1)
Sodium: 135 mmol/L (ref 135–145)

## 2023-04-03 LAB — CBC
HCT: 27.4 % — ABNORMAL LOW (ref 36.0–46.0)
Hemoglobin: 8.2 g/dL — ABNORMAL LOW (ref 12.0–15.0)
MCH: 29.9 pg (ref 26.0–34.0)
MCHC: 29.9 g/dL — ABNORMAL LOW (ref 30.0–36.0)
MCV: 100 fL (ref 80.0–100.0)
Platelets: 391 10*3/uL (ref 150–400)
RBC: 2.74 MIL/uL — ABNORMAL LOW (ref 3.87–5.11)
RDW: 15.7 % — ABNORMAL HIGH (ref 11.5–15.5)
WBC: 13.4 10*3/uL — ABNORMAL HIGH (ref 4.0–10.5)
nRBC: 0 % (ref 0.0–0.2)

## 2023-04-03 LAB — BLOOD CULTURE ID PANEL (REFLEXED) - BCID2

## 2023-04-03 LAB — MAGNESIUM: Magnesium: 1.5 mg/dL — ABNORMAL LOW (ref 1.7–2.4)

## 2023-04-03 MED ORDER — MAGNESIUM SULFATE 2 GM/50ML IV SOLN
2.0000 g | Freq: Once | INTRAVENOUS | Status: AC
  Administered 2023-04-03: 2 g via INTRAVENOUS
  Filled 2023-04-03: qty 50

## 2023-04-03 MED ORDER — FUROSEMIDE 10 MG/ML IJ SOLN
40.0000 mg | Freq: Once | INTRAMUSCULAR | Status: AC
  Administered 2023-04-03: 40 mg via INTRAVENOUS
  Filled 2023-04-03: qty 4

## 2023-04-03 MED ORDER — VENLAFAXINE HCL 25 MG PO TABS
25.0000 mg | ORAL_TABLET | Freq: Two times a day (BID) | ORAL | Status: DC
  Administered 2023-04-03 – 2023-04-04 (×3): 25 mg
  Filled 2023-04-03 (×3): qty 1

## 2023-04-03 MED ORDER — THIAMINE MONONITRATE 100 MG PO TABS
100.0000 mg | ORAL_TABLET | Freq: Every day | ORAL | Status: DC
  Administered 2023-04-04 – 2023-04-05 (×2): 100 mg
  Filled 2023-04-03 (×2): qty 1

## 2023-04-03 MED ORDER — IPRATROPIUM-ALBUTEROL 0.5-2.5 (3) MG/3ML IN SOLN
3.0000 mL | Freq: Two times a day (BID) | RESPIRATORY_TRACT | Status: DC
Start: 2023-04-03 — End: 2023-04-05
  Administered 2023-04-03 – 2023-04-05 (×4): 3 mL via RESPIRATORY_TRACT
  Filled 2023-04-03 (×3): qty 3

## 2023-04-03 NOTE — TOC Progression Note (Signed)
Transition of Care [facility]) - Progression Note    Patient Details  Name: Karen [name] MRN: [id number] Date of Birth: 07-01-1950  [facility]) CM/SW Contact  Geni Bers, RN Phone Number: 04/03/2023, 9:47 AM  Clinical Narrative:    Spoke with both Mr. Parshall and daughter [name] concerning disposition. Both state that they had selected, LTAC at [facility]. Insurance company asked for a peer to peer and MD is aware.    Expected Discharge Plan: Home/Self Care Barriers to Discharge: Continued Medical Work up  Expected Discharge Plan and Services   Discharge Planning Services: CM Consult   Living arrangements for the past 2 months: Single Family Home                                       Social Determinants of Health (SDOH) Interventions SDOH Screenings   Food Insecurity: No Food Insecurity (03/09/2023)  Housing: Patient Unable To Answer (03/09/2023)  Transportation Needs: No Transportation Needs (03/09/2023)  Utilities: Not At Risk (03/09/2023)  Depression (PHQ2-9): Low Risk  (03/23/2022)  Tobacco Use: Medium Risk (03/18/2023)    Readmission Risk Interventions    03/09/2023    1:14 PM  Readmission Risk Prevention Plan  Transportation Screening Complete  PCP or Specialist Appt within 5-7 Days Complete  Home Care Screening Complete  Medication Review (RN CM) Complete

## 2023-04-03 NOTE — Progress Notes (Signed)
NAME:  Karen [name], MRN:  [id number], DOB:  [birth year], LOS: 26 ADMISSION DATE:  03/08/2023, CONSULTATION DATE:  03/08/23  REFERRING MD: MD Halivand CHIEF COMPLAINT:  AMS, hypoxia   History of Present Illness:  Pt is a 72 yr old female with a significant pmh of HTN, Hypothyroidism, Major Depressive Disorder, cognitive impairment, arthritis, hearing loss, 30-40lb unintentional weight loss since 05/2022. Per patient's husband and daughter endorse that patient has had a functional and cognitive decline since the beginning of the 2024. Patient has had an extensive workup in regards to cognitive impairment and unintentional weight loss with MRI Brain, MRCPs, CT of chest and abdomen with no definitive etiology.   Patient presented to [facility] ED with AMS and hypoxia (O2 Sats in 70s-80s) by EMS from [facility], who was scheduled for a colonoscopy on 11/20. Patient did not receive colonoscopy due to hypoxia and AMS. Initial VBG revealed pH 7.17 and pCO2 > 123. Patient was given narcan during the initial ED workup with no response and subsequently placed on BIPAP due to increasing hypoxia and respiratory distress. Repeat VBG showed no improvement and patient was intubated due to hypoxia/hypercarbia. Also, CT of chest obtained by EDP showed evidence of scattered subsegmental PE and PCCM was consulted for further vent management as well as management for PE.   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    reports that she quit smoking about 22 years ago. Her smoking use included cigarettes. She started smoking about 32 years ago. She has never used smokeless tobacco.   has a past surgical history that includes Cesarean section; Colonoscopy with propofol (N/A, 11/17/2015); Colonoscopy; and Polypectomy.  Significant Hospital Events: Including procedures, antibiotic start and stop dates in addition to other pertinent events   11/20 Admit with AMS, acute hypoxic/hypercarbic respiratory failure, subsegmental PE intubated No RV strain on CT.  No emphysema reported on CT. PCCM consult EF 70%.  Elevated pulm artery pressures 53.  Right ventricle normal. RVP NEG MRSA PCR NEG 11/21 Intubated, sedated, tremors  11/22 Febrile, tachycardic, shivering/tremors seen with physical touch. IR LP obtained > negative 11/23 Neuro consulted. MRI brain ordered 11/25 TRAC ASPIRATE > RARE MSSA 11/28 weaning but high respiratory rate, pressure support of 10 11/30 extubated.  Requiring BiPAP.  Transfused 1 unit packed red cells 12/2 mucus plug reintubated, bronch with therapeutic aspiration of RUL, LUL, lingula, bronchus intermedius TRACH ASPIRATE RARE MSSA 12/3 CXR improved, extubated 12/4 reintubation after further discussions with family with bronch 12/5 MRI of C-Spine-neg for spinal infection/cord compression, remains on vent.  12/6 patient following commands, on precedex, will need to readdress GOC with patient and patient family  - Intubated/Sedated but responding  GOC iPAL by Dr Chestine Spore 12.7/24 FiO2 30% on the ventilator fentanyl infusion present Precedex infusion.  Ongoing heparin infusion and tube feeds.  Is on Unasyn.  Has Foley catheter.  afebrile with a white count 7000.  +13.75 L since admission. Per RN - family  interested in course of steroids 12/9  Following commands, on fentanyl/precedex gtt  12/10 Bedside trach following commands, on fentanyl gtt  12/11 No issues overnight  12/12 Periods of apnea on SBT this am, stop fentanyl drip. Tolerated TC x 9 hours 12/13 -12/15 Tolerated ATC 12/16- Trache collar, mobilize to chair   Interim History / Subjective:  Tolerated ATC 4 hours yesterday Episode of hypotension that briefly required levophed however patient had unchanged mentation. Weaned off levophed and hemodynamically stable this am  Objective   Blood pressure (!) 109/55, pulse 74, temperature (!) 101.9 F (38.8 C), temperature source Axillary, resp. rate (!) 24, height 5\' 4"  (1.626 m), weight 51.4 kg, SpO2 100%.    Vent Mode: PRVC FiO2 (%):  [28 %-30 %] 30 % Set Rate:  [24 bmp] 24 bmp Vt Set:  [440 mL] 440 mL PEEP:  [5 cmH20] 5 cmH20 Plateau Pressure:  [18 cmH20-19 cmH20] 18 cmH20   Intake/Output Summary (Last 24 hours) at 04/03/2023 0700 Last data filed at 04/03/2023 0341 Gross per 24 hour  Intake 960.46 ml  Output 2970 ml  Net -2009.54 ml   Filed Weights   03/31/23 0500 04/01/23 0426 04/02/23 0500  Weight: 51.1 kg 53.2 kg 51.4 kg   Physical Exam: General: chronically ill appearing female, on vent, lying in ICU bed  HENT: Normocephalic, #6 shiley trache, NG tuve, poor dentition/missing teeth Pink MM  Respiratory: clear, diminished throughout lung bases, no respiratory distress  Cardiovascular: s1, s2, RRR, no MRG, no JVD  GI: soft, BS active  Extremities: 1 + edema generalized, moves extremities to commands Neuro: Alert, follows commands, RASS 0    Resolved problems:   Hypoglycemia  Fluid Overload  Acute hypoxemic respiratory failure  MSSA VAP (completed rx)  Assessment & Plan:  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 Possible medication related however tolerating precedex.  Repeat BC 1/3 reveal staph species- more likely contaminate  Echo 12/15 EF 65-70%, no regional wall abnormalities  Has been receiving lasix due to fluid positive status, currently still ~ 1.5L +  P:  Continue to trend BPs with vital signs Continue to wean precedex gtt as tolerated, decrease max parameters Continue gentle diuresis with lasix 40mg  once   Subsegmental PEs  Repeat Vas Ultrasound-negative for DVTs  P: Continue eliquis   Failure to Thrive w/ Progressive weakness  Malnutrition  Low vitamin B7, copper, and vitamin C Secondary to neurocognitive disorder (dementia) vs chronic aspiration had extensive outpatient with unrevealing results to date including negative LP and MRI C-spine   P: Continue tube feeding per small bore feeding tube with RD recs Continue supplementing thiamine, vitamin c, copper   Hypomagnesemia  P: 2 grams of magnesium ordered  Repeat Mag, phos with BMET in AM   Anemia of Critical Illness  No signs of bleeding P: Continue to trend CBC Transfuse if hgb < 7   HTN  HLD Episodes of hypotension in setting of sedation vs diuresis  P: Continue to monitor VS per unit routine  Continue statin   Hypothyroidism P: Continue levothyroxine   Anxiety  Depression  Dementia P: Continue to wean precedex, will change max range  parameters Continue Aricept  Continue oxycodone 5mg  q4 prn  Add Effexor 25mg  BID   Best Practice (right click and "Reselect all SmartList Selections" daily)   Diet/type: TUBE FEEDS DVT prophylaxis: DOAC  Pressure ulcer(s). Stage 2 on Buttocks BL  GI prophylaxis: PPI Lines:  N/A Foley:  Yes, and it is still needed Code Status:  full code Last date of multidisciplinary goals of care discussion: 12/10  CC time: 30 mins  Christian Jahnavi Muratore AGACNP-BC   [facility] 04/03/2023, 11:18 AM  Please see [email or web address] for pager details.  From 7A-7P if no response, please call [phone]. After hours, please call ELink [phone].

## 2023-04-03 NOTE — Progress Notes (Signed)
PHARMACY - PHYSICIAN COMMUNICATION CRITICAL VALUE ALERT - BLOOD CULTURE IDENTIFICATION (BCID)  Karen [name] is an 72 y.o. female who presented to [facility] on 03/08/2023 with a chief complaint of AMS and hypoxia   Assessment:  1/3 staph species, no R  Name of physician (or Provider) Contacted:   Current antibiotics: none  Changes to prescribed antibiotics recommended:  None, probable contaminant  No results found for this or any previous visit.  Arley Phenix RPh 04/03/2023, 6:02 AM

## 2023-04-03 NOTE — Progress Notes (Signed)
Occupational Therapy Treatment Patient Details Name: Karen [name] MRN: [id number] DOB: 12/02/1950 Today's Date: 04/03/2023   History of present illness Patient is a 72 year old female who presented on 11/20 with hypoxia and AMS from scheduled colonoscopy. Patient was provided with narcan and placed on BiPAP. Patient was intubated on 11/21 with sedation and tremors. Patient was extubated on 11/30 onto BiPAP. Patient developed mucus plug on 12/2 with reintubation on 12/4 with bronch. Patient with reduced prece dex. 12/10 patient underwent trach placement. 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  PMH: HTN, major depressive disorder, cognitive impairment, hypothyroidism, arthritis.   OT comments  Patient was able to progress to sitting EOB on this date. Patient was on trach 5L/min FiO2 28% during session. Patient with strong posterior leaning and lateral leaning to L side with patient resistive to hip flexion to improve sitting balance. Patient noted to have head turned to R side and down turned. Patient and spouse in room were educated on how to position pillows to encourage midline positioning of neck and encouraging all visitors to stand on L side to encourage patient to turn head that way. Patients husband and nurse verbalized understanding. Patient will benefit from continued inpatient follow up therapy, <3 hours/day. Patient's discharge plan remains appropriate at this time. OT will continue to follow acutely.        If plan is discharge home, recommend the following:  Two people to help with walking and/or transfers;Help with stairs or ramp for entrance;Direct supervision/assist for financial management;Assist for transportation;Direct supervision/assist for medications management;Assistance with cooking/housework   Equipment Recommendations  None recommended by OT       Precautions / Restrictions Precautions Precautions:  Fall Precaution Comments: small bor feeding tube, trach, fecal management system Restrictions Weight Bearing Restrictions Per Provider Order: No       Mobility Bed Mobility Overal bed mobility: Needs Assistance Bed Mobility: Rolling, Supine to Sit, Sit to Supine Rolling: +2 for safety/equipment, +2 for physical assistance, Total assist     Sit to supine: Total assist, +2 for physical assistance, +2 for safety/equipment   General bed mobility comments: +2 total to roll , assist flexion of knees to facilite, Bed pad used  to slide patient around to bed edge to prevent dislodging fecal tube, total assistance  then back to supine           Balance Overall balance assessment: Needs assistance Sitting-balance support: Feet supported, Bilateral upper extremity supported Sitting balance-Leahy Scale: Zero Sitting balance - Comments: posterior  and to right bias, Multimodal cues to reach forward , weakly lifts lift arm forward Postural control: Right lateral lean, Posterior lean       ADL either performed or assessed with clinical judgement   ADL Overall ADL's : Needs assistance/impaired           General ADL Comments: Patient was +2 to advance to EOB and maintain balance. patient attempted to be encouraged to lean forwards with trunk to correct posterior leaning. patient able to follow cues occasionally to lean forwards with appearance of pain response with attempting flexion at hips. patient even with husband present to attempt encouragement to lean shoulders forwards for a hug was not encough to help with posture correction. patient was able to reach with R hand to therapist hand with increased time and multimodal cues. patient unable to coordinate L hand to touch therapist hands when extended in front of patient.      Cognition  Arousal: Lethargic Behavior During Therapy: Flat affect Overall Cognitive Status: Impaired/Different from baseline Area of Impairment: Following  commands, Awareness     Following Commands: Follows one step commands inconsistently       General Comments: patient was able to clearly mouth a few words during session which was an improvement from OT eval. husband was present during session.                   Pertinent Vitals/ Pain       Pain Assessment Pain Assessment: Faces Faces Pain Scale: Hurts even more Pain Location: trach site Pain Descriptors / Indicators: Discomfort, Grimacing Pain Intervention(s): Limited activity within patient's tolerance, Monitored during session         Frequency  Min 2X/week        Progress Toward Goals  OT Goals(current goals can now be found in the care plan section)  Progress towards OT goals: Progressing toward goals     Plan      Co-evaluation      Reason for Co-Treatment: Complexity of the patient's impairments (multi-system involvement);For patient/therapist safety;To address functional/ADL transfers PT goals addressed during session: Mobility/safety with mobility OT goals addressed during session: ADL's and self-care      AM-PAC OT "6 Clicks" Daily Activity     Outcome Measure   Help from another person eating meals?: Total (NPO) Help from another person taking care of personal grooming?: Total Help from another person toileting, which includes using toliet, bedpan, or urinal?: Total Help from another person bathing (including washing, rinsing, drying)?: Total Help from another person to put on and taking off regular upper body clothing?: Total Help from another person to put on and taking off regular lower body clothing?: Total 6 Click Score: 6    End of Session Equipment Utilized During Treatment: Oxygen  OT Visit Diagnosis: Pain;Muscle weakness (generalized) (M62.81);Other symptoms and signs involving cognitive function   Activity Tolerance Patient limited by fatigue;Patient limited by pain   Patient Left in bed;with call bell/phone within reach;with  bed alarm set;with family/visitor present;with restraints reapplied   Nurse Communication Mobility status        Time: 6045-4098 OT Time Calculation (min): 19 min  Charges: OT General Charges $OT Visit: 1 Visit OT Treatments $Therapeutic Activity: 8-22 mins  Rosalio Loud, MS Acute Rehabilitation Department Office# [phone]   Selinda Flavin 04/03/2023, 1:35 PM

## 2023-04-03 NOTE — Progress Notes (Signed)
Physical Therapy Treatment Patient Details Name: Karen [name] MRN: [id number] DOB: 28-Jan-1951 Today's Date: 04/03/2023   History of Present Illness Patient is a 72 year old female who presented on 11/20 with hypoxia and AMS from scheduled colonoscopy. Patient was provided with narcan and placed on BiPAP. Patient was intubated on 11/21 with sedation and tremors. Patient was extubated on 11/30 onto BiPAP. Patient developed mucus plug on 12/2 with reintubation on 12/4 with bronch. Patient with reduced prece dex. 12/10 patient underwent trach placement. 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  PMH: HTN, major depressive disorder, cognitive impairment, hypothyroidism, arthritis.    PT Comments  The patient is intermittently aroused, follows 1 step inconsistently.  Patient assisted  to sitting  onto bed edge  x ~ 4 minutes with total assistance  of 2.  Patient on 28% TC, SPO2 100%. Patient tends to favor right side, cues to  look to left, does not  turn  beyond neutral . Continue  progressive mobility/ transfers as tolerated.     If plan is discharge home, recommend the following: Two people to help with walking and/or transfers;Two people to help with bathing/dressing/bathroom;Assist for transportation;Help with stairs or ramp for entrance   Can travel by private vehicle        Equipment Recommendations  None recommended by PT    Recommendations for Other Services       Precautions / Restrictions Precautions Precautions: Fall Precaution Comments: small bor feeding tube, trach, fecal management system Restrictions Weight Bearing Restrictions Per Provider Order: No     Mobility  Bed Mobility Overal bed mobility: Needs Assistance Bed Mobility: Rolling, Supine to Sit, Sit to Supine Rolling: +2 for safety/equipment, +2 for physical assistance, Total assist   Supine to sit: +2 for safety/equipment, +2 for physical assistance,  Total assist Sit to supine: Total assist, +2 for physical assistance, +2 for safety/equipment   General bed mobility comments: +2 total to roll , assist flexion  of knees to facilitye, Bed pad used  to slide patient around to bed edge to prevent dislodging fecal tube, total assistance  then back to supine    Transfers                        Ambulation/Gait                   Stairs             Wheelchair Mobility     Tilt Bed    Modified Rankin (Stroke Patients Only)       Balance Overall balance assessment: Needs assistance Sitting-balance support: Feet supported, Bilateral upper extremity supported Sitting balance-Leahy Scale: Zero Sitting balance - Comments: posterior  and to right bias, Multimodal cues to reach forward , weakly lifts lift arm forward Postural control: Right lateral lean, Posterior lean Standing balance support: Bilateral upper extremity supported                                Cognition Arousal: Lethargic Behavior During Therapy: Flat affect Overall Cognitive Status: Impaired/Different from baseline Area of Impairment: Following commands, Awareness                       Following Commands: Follows one step commands inconsistently   Awareness: Intellectual   General Comments: patient mouthed a few words, intermittently closes eyes,  reaches to trach when asked about pain,        Exercises      General Comments        Pertinent Vitals/Pain Pain Assessment Pain Assessment: PAINAD Faces Pain Scale: Hurts even more Pain Location: trach site Pain Descriptors / Indicators: Discomfort, Grimacing Pain Intervention(s): Monitored during session    Home Living                          Prior Function            PT Goals (current goals can now be found in the care plan section) Progress towards PT goals: Progressing toward goals    Frequency    Min 1X/week      PT Plan       Co-evaluation PT/OT/SLP Co-Evaluation/Treatment: Yes Reason for Co-Treatment: Complexity of the patient's impairments (multi-system involvement);For patient/therapist safety;To address functional/ADL transfers PT goals addressed during session: Mobility/safety with mobility OT goals addressed during session: ADL's and self-care      AM-PAC PT "6 Clicks" Mobility   Outcome Measure  Help needed turning from your back to your side while in a flat bed without using bedrails?: Total Help needed moving from lying on your back to sitting on the side of a flat bed without using bedrails?: Total Help needed moving to and from a bed to a chair (including a wheelchair)?: Total Help needed standing up from a chair using your arms (e.g., wheelchair or bedside chair)?: Total Help needed to walk in hospital room?: Total Help needed climbing 3-5 steps with a railing? : Total 6 Click Score: 6    End of Session Equipment Utilized During Treatment: Oxygen (28% trach collar) Activity Tolerance: Patient tolerated treatment well Patient left: in bed;with call bell/phone within reach;with bed alarm set;with family/visitor present Nurse Communication: Mobility status;Need for lift equipment PT Visit Diagnosis: Unsteadiness on feet (R26.81);Muscle weakness (generalized) (M62.81);Difficulty in walking, not elsewhere classified (R26.2)     Time: 1103-1130 PT Time Calculation (min) (ACUTE ONLY): 27 min  Charges:    $Therapeutic Activity: 8-22 mins PT General Charges $$ ACUTE PT VISIT: 1 Visit                     Blanchard Kelch PT Acute Rehabilitation Services Office [phone] Weekend pager-[phone]    Rada Hay 04/03/2023, 12:42 PM

## 2023-04-03 NOTE — Plan of Care (Signed)
  Problem: Clinical Measurements: Goal: Respiratory complications will improve Outcome: Progressing Goal: Cardiovascular complication will be avoided Outcome: Progressing   Problem: Coping: Goal: Level of anxiety will decrease Outcome: Progressing   Problem: Pain Management: Goal: General experience of comfort will improve Outcome: Progressing   Problem: Safety: Goal: Ability to remain free from injury will improve Outcome: Progressing   Problem: Respiratory: Goal: Patent airway maintenance will improve Outcome: Progressing

## 2023-04-03 NOTE — Telephone Encounter (Signed)
Pharmacy Patient Advocate Encounter  Insurance verification completed.    The patient is insured through [employer]. Patient has Medicare and is not eligible for a copay card, but may be able to apply for patient assistance, if available.    Ran test claim for Eliquis and the current 30 day co-pay is $45.00.   This test claim was processed through [facility]- copay amounts may vary at other pharmacies due to pharmacy/plan contracts, or as the patient moves through the different stages of their insurance plan.

## 2023-04-03 NOTE — Evaluation (Signed)
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 Three variants of uncertain significance were detected - one in the AXIN2 gene called c.1829G>A, one in the PMS2 gene called c.964G>A, and one in the RNF43 gene called c.1114C>T. The report date is 06/24/2019.     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 She presented to the hospital on 03/08/23 with hypoxia and AMS  from scheduled colonoscopy. She was provided with narcan and placed on BiPAP. She was intubated 11/21 and extubated 11/30 back to BiPAP. She developed a mucous plug on 12/2 and was reintubated on 12/4 with bronch. On 12/10 she underwent trach placement and is currently with a #6 cuffed trach. Patient noted to have chronic ongoing aspiration events s/p percutaneous tracheostomy.    Assessment / Plan / Recommendation  Clinical Impression  Patient was unable to tolerate cuff deflation x2 attempts during this evaluation. PMV was not attempted. Prior to cuff deflation, RR was in low 20's and SpO2 was in  mid to high 90%. During cuff deflation, patient's RR increased  (as high as 42), SpO2 decreased to as low as 89% and HR decreased briefly to low to mid 40's. When cuff reinflated, her vitals started to slowly return to baseline. During second cuff deflation attempt, patient experienced the same symptoms (minus the HR decline) and in addition, she had some wet sounding tracheal exhalations. Again, these symptoms subsided after a short period of time. SLP discussed patient's current state and goals with spouse and their hired Barrister's clerk. SLP Visit Diagnosis: Aphonia (R49.1)    SLP Assessment  Patient needs continued Speech Lanaguage Pathology Services    Recommendations for follow up therapy are one component of a multi-disciplinary discharge planning process, led by the attending physician.  Recommendations may be updated based on patient status, additional functional criteria and insurance authorization.  Follow Up Recommendations  SLP  at Long-term acute care hospital    Assistance Recommended at Discharge Frequent or constant Supervision/Assistance  Functional Status Assessment Patient has had a recent decline in their functional status and demonstrates the ability to make significant improvements in function in a reasonable and predictable amount of time.  Frequency and Duration min 2x/week   2 weeks    PMSV Trial  Did not attempt   Tracheostomy Tube       Vent Dependency  Vent Mode: PRVC Set Rate: 24 bmp PEEP: 5 cmH20 FiO2 (%): 28 % Vt Set: 440 mL    Cuff Deflation Trial Tolerated Cuff Deflation: No Length of Time for Cuff Deflation Trial: SLP deflated cuff two times for less than 60 seconds each attempt Behavior: Alert         Angela Nevin, MA, CCC-SLP Speech Therapy

## 2023-04-04 DIAGNOSIS — J9602 Acute respiratory failure with hypercapnia: Secondary | ICD-10-CM | POA: Diagnosis not present

## 2023-04-04 DIAGNOSIS — R5381 Other malaise: Secondary | ICD-10-CM | POA: Diagnosis present

## 2023-04-04 DIAGNOSIS — D649 Anemia, unspecified: Secondary | ICD-10-CM | POA: Insufficient documentation

## 2023-04-04 DIAGNOSIS — R0689 Other abnormalities of breathing: Secondary | ICD-10-CM | POA: Insufficient documentation

## 2023-04-04 DIAGNOSIS — E162 Hypoglycemia, unspecified: Secondary | ICD-10-CM

## 2023-04-04 DIAGNOSIS — E54 Ascorbic acid deficiency: Secondary | ICD-10-CM | POA: Insufficient documentation

## 2023-04-04 DIAGNOSIS — G9340 Encephalopathy, unspecified: Secondary | ICD-10-CM | POA: Insufficient documentation

## 2023-04-04 DIAGNOSIS — Z930 Tracheostomy status: Secondary | ICD-10-CM

## 2023-04-04 DIAGNOSIS — L8990 Pressure ulcer of unspecified site, unspecified stage: Secondary | ICD-10-CM | POA: Insufficient documentation

## 2023-04-04 DIAGNOSIS — J15211 Pneumonia due to Methicillin susceptible Staphylococcus aureus: Secondary | ICD-10-CM

## 2023-04-04 DIAGNOSIS — E610 Copper deficiency: Secondary | ICD-10-CM | POA: Insufficient documentation

## 2023-04-04 DIAGNOSIS — J9601 Acute respiratory failure with hypoxia: Secondary | ICD-10-CM | POA: Diagnosis not present

## 2023-04-04 DIAGNOSIS — G709 Myoneural disorder, unspecified: Secondary | ICD-10-CM | POA: Insufficient documentation

## 2023-04-04 DIAGNOSIS — Z9189 Other specified personal risk factors, not elsewhere classified: Secondary | ICD-10-CM

## 2023-04-04 DIAGNOSIS — I952 Hypotension due to drugs: Secondary | ICD-10-CM

## 2023-04-04 DIAGNOSIS — E539 Vitamin B deficiency, unspecified: Secondary | ICD-10-CM | POA: Insufficient documentation

## 2023-04-04 LAB — GLUCOSE, CAPILLARY
Glucose-Capillary: 109 mg/dL — ABNORMAL HIGH (ref 70–99)
Glucose-Capillary: 110 mg/dL — ABNORMAL HIGH (ref 70–99)
Glucose-Capillary: 113 mg/dL — ABNORMAL HIGH (ref 70–99)
Glucose-Capillary: 117 mg/dL — ABNORMAL HIGH (ref 70–99)
Glucose-Capillary: 125 mg/dL — ABNORMAL HIGH (ref 70–99)
Glucose-Capillary: 149 mg/dL — ABNORMAL HIGH (ref 70–99)

## 2023-04-04 LAB — CBC
HCT: 25.9 % — ABNORMAL LOW (ref 36.0–46.0)
Hemoglobin: 8.1 g/dL — ABNORMAL LOW (ref 12.0–15.0)
MCH: 30.7 pg (ref 26.0–34.0)
MCHC: 31.3 g/dL (ref 30.0–36.0)
MCV: 98.1 fL (ref 80.0–100.0)
Platelets: 389 10*3/uL (ref 150–400)
RBC: 2.64 MIL/uL — ABNORMAL LOW (ref 3.87–5.11)
RDW: 15.3 % (ref 11.5–15.5)
WBC: 11.8 10*3/uL — ABNORMAL HIGH (ref 4.0–10.5)
nRBC: 0 % (ref 0.0–0.2)

## 2023-04-04 LAB — BASIC METABOLIC PANEL
Anion gap: 8 (ref 5–15)
BUN: 32 mg/dL — ABNORMAL HIGH (ref 8–23)
CO2: 24 mmol/L (ref 22–32)
Calcium: 9.4 mg/dL (ref 8.9–10.3)
Chloride: 106 mmol/L (ref 98–111)
Creatinine, Ser: 0.37 mg/dL — ABNORMAL LOW (ref 0.44–1.00)
GFR, Estimated: >60 mL/min (ref >60)
Glucose, Bld: 98 mg/dL (ref 70–99)
Potassium: 3.3 mmol/L — ABNORMAL LOW (ref 3.5–5.1)
Sodium: 138 mmol/L (ref 135–145)

## 2023-04-04 LAB — PHOSPHORUS: Phosphorus: 3 mg/dL (ref 2.5–4.6)

## 2023-04-04 LAB — MAGNESIUM: Magnesium: 2 mg/dL (ref 1.7–2.4)

## 2023-04-04 MED ORDER — POTASSIUM CHLORIDE 20 MEQ PO PACK
40.0000 meq | PACK | Freq: Two times a day (BID) | ORAL | Status: DC
  Administered 2023-04-04 – 2023-04-05 (×3): 40 meq
  Filled 2023-04-04 (×3): qty 2

## 2023-04-04 MED ORDER — QUETIAPINE 12.5 MG HALF TABLET: 12.5000 mg | ORAL_TABLET | Freq: Every day | ORAL | Status: DC

## 2023-04-04 MED ORDER — VENLAFAXINE HCL 37.5 MG PO TABS
37.5000 mg | ORAL_TABLET | Freq: Two times a day (BID) | ORAL | Status: DC
  Administered 2023-04-04 – 2023-04-05 (×2): 37.5 mg
  Filled 2023-04-04 (×2): qty 1

## 2023-04-04 MED ORDER — STERILE WATER FOR INJECTION IJ SOLN
INTRAMUSCULAR | Status: AC
  Administered 2023-04-04: 2.1 mL
  Filled 2023-04-04: qty 10

## 2023-04-04 MED ORDER — SODIUM CHLORIDE 0.9% FLUSH
3.0000 mL | Freq: Two times a day (BID) | INTRAVENOUS | Status: DC
  Administered 2023-04-04 – 2023-04-05 (×2): 3 mL via INTRAVENOUS

## 2023-04-04 MED ORDER — QUETIAPINE FUMARATE 50 MG PO TABS: 25.0000 mg | ORAL_TABLET | Freq: Two times a day (BID) | ORAL | Status: DC

## 2023-04-04 MED ORDER — QUETIAPINE FUMARATE 50 MG PO TABS
25.0000 mg | ORAL_TABLET | Freq: Two times a day (BID) | ORAL | Status: DC
  Administered 2023-04-04 – 2023-04-05 (×3): 25 mg
  Filled 2023-04-04 (×3): qty 1

## 2023-04-04 MED ORDER — OLANZAPINE 10 MG IM SOLR
5.0000 mg | Freq: Four times a day (QID) | INTRAMUSCULAR | Status: DC | PRN
  Administered 2023-04-04: 5 mg via INTRAVENOUS
  Filled 2023-04-04: qty 10

## 2023-04-04 NOTE — Progress Notes (Signed)
eLink Physician-Brief Progress Note Patient Name: Karen [name] DOB: [birth year] MRN: [id number]   Date of Service  04/04/2023  HPI/Events of Note  72 year old female who initially presented for colonoscopy due to failure to thrive/weight loss and found hypoxemic prior to procedure requiring intubation. CTA with scattered subsegemental PE. Hospital course included work-up for altered mental status and fever (neg LP and imaging), failed extubation and failure to wean from vent, s/p trach 03/28/23.   Ongoing agitation on Precedex.  Has been sensitive to the Precedex and became hypotensive on an earlier.  QTc 373 on last check.  eICU Interventions  Add Zyprexa as needed for agitation     Intervention Category Minor Interventions: Agitation / anxiety - evaluation and management  Morty Ortwein 04/04/2023, 3:55 AM

## 2023-04-04 NOTE — Plan of Care (Signed)
  Problem: Clinical Measurements: Goal: Ability to maintain clinical measurements within normal limits will improve Outcome: Progressing Goal: Diagnostic test results will improve Outcome: Progressing Goal: Respiratory complications will improve Outcome: Progressing   Problem: Nutrition: Goal: Adequate nutrition will be maintained Outcome: Progressing   Problem: Coping: Goal: Level of anxiety will decrease Outcome: Progressing

## 2023-04-04 NOTE — Progress Notes (Signed)
SLP Cancellation Note  Patient Details Name: Karen [name] MRN: [id number] DOB: 09/22/1950   Cancelled treatment:       Reason Eval/Treat Not Completed: Other (comment);Medical issues which prohibited therapy (SLP requested RN, Hope, assess for upper airway patency to which she still has no patency, will continue efforts)  Rolena Infante, MS [facility] SLP Acute Rehab Services Office [phone]  Chales Abrahams 04/04/2023, 12:10 PM

## 2023-04-04 NOTE — TOC Progression Note (Signed)
Transition of Care [facility]) - Progression Note    Patient Details  Name: Karen [name] MRN: [id number] Date of Birth: 1950-12-09  Transition of Care [facility]) CM/SW Contact  Geni Bers, RN Phone Number: 04/04/2023, 3:31 PM  Clinical Narrative:    Continue to wait for [facility] authorization for [employer]. Dr. Revonda Standard called  for peer to peer on 12/16 to [facility] before 12 noon with no call back from [facility].  [place]  CM returned this AM call to confirm that the call from Dr. Joselyn Glassman was received before 12 noon. Waiting for [employer]. [employer] is also following up with [employer].    Expected Discharge Plan: Home/Self Care Barriers to Discharge: Continued Medical Work up  Expected Discharge Plan and Services   Discharge Planning Services: CM Consult   Living arrangements for the past 2 months: Single Family Home                                       Social Determinants of Health (SDOH) Interventions SDOH Screenings   Food Insecurity: No Food Insecurity (03/09/2023)  Housing: Patient Unable To Answer (03/09/2023)  Transportation Needs: No Transportation Needs (03/09/2023)  Utilities: Not At Risk (03/09/2023)  Depression (PHQ2-9): Low Risk  (03/23/2022)  Tobacco Use: Medium Risk (03/18/2023)    Readmission Risk Interventions    03/09/2023    1:14 PM  Readmission Risk Prevention Plan  Transportation Screening Complete  PCP or Specialist Appt within 5-7 Days Complete  Home Care Screening Complete  Medication Review (RN CM) Complete

## 2023-04-04 NOTE — Progress Notes (Signed)
Nutrition Follow-up  DOCUMENTATION CODES:   Non-severe (moderate) malnutrition in context of chronic illness  INTERVENTION:  - Continue TF: Vital 1.2 at 55 ml/h  Provides 1584 kcal, 99 gm protein, 1070 ml free water daily    - FWF per CCM/MD   - Checking micronutrient labs Biotin, vitamin D, vitamin C, and copper.             - Vitamin D: WNL             - Vitamin C: low, continue 500mg  vitamin C BID x30 days then recheck             - Copper: low, continue 2mg  x30 days then recheck         - Biotin: low, continue 10mg  x30 days then recheck              - Vitamin B6: low, continue 100mg  daily x30 days             - Continue liquid multivitamin with minerals.   - Monitor weight trends.  NUTRITION DIAGNOSIS:   Moderate Malnutrition related to chronic illness as evidenced by mild fat depletion, moderate muscle depletion, percent weight loss (23% in 9 months). *ongoing  GOAL:   Patient will meet greater than or equal to 90% of their needs *met with TF  MONITOR:   Vent status, Labs, Weight trends, TF tolerance  REASON FOR ASSESSMENT:   Consult Enteral/tube feeding initiation and management  ASSESSMENT:   72 yr old female with PMH significant of HTN, Hypothyroidism, MDD, cognitive impairment, arthritis who presented with AMS and hypoxia from [facility] after being scheduled for a colonoscopy.  11/20 Admit; Intubated  11/21 Vit 1.2 started at 59mL/hr 11/23 Reached goal of 30mL/hr 11/30 Extubated, OGT removed; Tube feeds stopped 12/2 Re-intubated, OGT placed 12/3 Extubated, OGT removed 12/4 Re-intubated; new OGT placed 12/5 restarting Vital 1.2 at 35mL/hr 12/6 reached goal TF 12/10 s/p trach; new SBFT placed, TF resumed  Patient sleeping at time of visit. TF infusing at goal of 39mL/hr. ATC trials remain ongoing. Husband at bedside. Discussed nutrition plan of care. Addressed all questions.  Patient continues to have diarrhea but has been getting  Miralax and Colace.     Admit weight: 122# (carried over?) -> 111# Current weight: 111# I&O's: -5L + generalized edema   Medications reviewed and include: Colace, Miralax, Lasix, 100mg  vitamin B6, 100mg  thiamine, MVI, 500mg  vitamin B BID, 10mg  Biotin, 2mg  copper Unasyn  Precedex Fentanyl   Labs reviewed:  K+ 3.3 Vitamin B6: 1.9 (LOW) Vitamin D: 30.33 (WNL) Vitamin C: 0.3 (LOW) Biotin: <0.05 (LOW) Copper: 76 (LOW)  Diet Order:   Diet Order             Diet NPO time specified  Diet effective now                   EDUCATION NEEDS:  Education needs have been addressed  Skin:  Skin Assessment: Skin Integrity Issues: Skin Integrity Issues:: Stage II Stage II: Left Buttocks  Last BM:  12/17 - type 7 - rectal tube  Height:  Ht Readings from Last 1 Encounters:  03/22/23 5\' 4"  (1.626 m)   Weight:  Wt Readings from Last 1 Encounters:  04/04/23 50.5 kg    BMI:  Body mass index is 19.11 kg/m.  Estimated Nutritional Needs:  Kcal:  1550-1750 Protein:  85-100g Fluid:  1.7L/day    Shelle Iron RD, LDN Contact via Secure Chat.

## 2023-04-04 NOTE — Discharge Summary (Signed)
Physician Discharge Summary         Patient ID: Karen [name] MRN: [id number] DOB/AGE: 1950-11-22 72 y.o.  Admit date: 03/08/2023 Discharge date: 04/05/2023  Discharge Diagnoses:    Active Hospital Problems   Diagnosis Date Noted   Tracheostomy dependence (HCC) 04/04/2023    Priority: 1.   Ineffective airway clearance 04/04/2023    Priority: 1.   Difficulty weaning from ventilator (HCC) 04/03/2023    Priority: 1.   Neuromuscular weakness (HCC) 04/04/2023    Priority: 2.   Physical deconditioning 04/04/2023    Priority: 2.   Acute metabolic encephalopathy 03/17/2023    Priority: 2.   Neurodegenerative cognitive impairment (HCC)     Priority: 2.   Major depressive disorder     Priority: 3.   Multiple subsegmental pulmonary emboli without acute cor pulmonale (HCC) 03/09/2023    Priority: 4.   Pressure injury of skin 04/04/2023   Vitamin C deficiency 04/04/2023   Vitamin B deficiency 04/04/2023   Copper deficiency 04/04/2023   At risk for fluid and electrolyte imbalance 04/04/2023   Anemia 04/04/2023   Failure to thrive in adult 03/23/2023   Malnutrition of moderate degree 03/10/2023   Hypothyroidism    GERD (gastroesophageal reflux disease)    Hyperlipidemia     Resolved Hospital Problems   Diagnosis Date Noted Date Resolved   MSSA (methicillin susceptible Staphylococcus aureus) pneumonia (HCC) 04/04/2023 04/04/2023   Hypotension due to medication 04/04/2023 04/04/2023   Hypoglycemia 04/04/2023 04/04/2023   Acute respiratory failure with hypoxia and hypercapnia [facility]) 03/09/2023 04/04/2023   Hypernatremia 03/09/2023 04/04/2023      Discharge summary    72 yr old female with a significant pmh of HTN, Hypothyroidism, Major Depressive Disorder, cognitive impairment, arthritis, hearing loss, 30-40lb unintentional weight loss since 05/2022. Per family patient has had a functional and cognitive decline since the beginning of the 2024. She is s/p extensive  workup in regards to cognitive impairment and unintentional weight loss with MRI Brain, MRCPs, CT of chest and abdomen with no definitive etiology.    Presented to [facility] ED with AMS and hypoxia (O2 Sats in 70s-80s) by EMS from [facility], who was scheduled for a colonoscopy on 11/20. Patient did not receive colonoscopy due to hypoxia and AMS. Initial VBG revealed pH 7.17 and pCO2 > 123. Patient was given narcan during the initial ED workup with no response and subsequently placed on BIPAP due to increasing hypoxia and respiratory distress. Repeat VBG showed no improvement and patient was intubated due to hypoxia/hypercarbia. Also, CT of chest obtained by EDP showed evidence of scattered subsegmental PE and PCCM was consulted for further vent management as well as management for PE.   Hospital Course  11/20 Admit with AMS, acute hypoxic/hypercarbic respiratory failure, subsegmental PE intubated No RV strain on CT.  No emphysema reported on CT. PCCM consulted EF 70%.  Elevated pulm artery pressures 53.  Right ventricle normal. RVP NEG MRSA PCR NEG 11/21 Intubated, sedated, tremors  11/22 Febrile, tachycardic, shivering/tremors seen with physical touch. IR LP obtained > negative 11/23 Neuro consulted. MRI brain ordered 11/25 TRAC ASPIRATE > RARE MSSA 11/28 weaning but high respiratory rate, pressure support of 10 11/30 extubated.  Requiring BiPAP.  Transfused 1 unit packed red cells 12/2 mucus plug reintubated, bronch with therapeutic aspiration of RUL, LUL, lingula, bronchus intermedius TRACH ASPIRATE RARE MSSA 12/3 CXR improved, extubated 12/4 reintubation after further discussions with family with bronch 12/5 MRI of C-Spine-neg for spinal infection/cord compression, remains on  vent.  12/6 patient following commands, on precedex, Intubated/Sedated but responding  12.7/24 FiO2 30% on the ventilator fentanyl infusion present Precedex infusion.  Ongoing heparin infusion and tube feeds.   on  Unasyn.  Has Foley catheter.  afebrile with a white count 7000.  +13.75 L since admission.  12/9 Following commands, on fentanyl/precedex gtt  12/10 Bedside trach following commands, on fentanyl gtt  12/11 No issues overnight  12/12 Periods of apnea on SBT stopped  fentanyl drip. Tolerated TC x 9 hours 12/13 -12/15 Tolerated ATC 12/16- Trache collar/still needing vent at night, PMV trial unsuccessful, weaning precedex, added home Effexor. Got zyprexa at hs.  12/17 Weaned off precedex, adding per tube adjuncts (seroquel BID and increased her home effexor)  continued trach collar. Trach sutures removed   12/18 rested on vent. Following commands but still impulsive. Increasing seroquel to 50mg  bid    Consultants Neuro  Physical therapy Speech therapy Registered dietitian   Discharge Plan by Active Problems    Tracheostomy/vent dependent secondary to ineffective airway clearance, recurrent aspiration and prob element of neuromuscular weakness Plan Continue to attempt trache collar and trial with PMV Vent PRN.  Would be reasonable to start day time PMV trials Continue VAP and PAD bundle, RASS of 0  Continue Routine trache care/with adequate pulm hygiene  Mobilize out of bed to chair via lift, continue PT/OT    Subsegmental PEs  Plan Continue eliquis     Neurodegenerative cognitive impairment complicated by acute metabolic encephalopathy and Major depression disorder and anxiety  Plan Continue Aricept  Increased Effexor to 37.5 mg BID, family will be interested in weaning off from this over course of LTAC stay given concern about wt loss. Continued currently as adjunct to assist with weaning off precedex earlier  Seroquel 50 mg bid  Continue Oxycodone and Zyprexa PRN  Continue to Monitor QTC  Failure to Thrive w/ Progressive weakness with physical deconditioning, Protein calorie Malnutrition  and Low vitamin B7, copper, and vitamin C Plan Continue tube feeding per small bore  feeding tube with RD recs Continue supplementing thiamine, vitamin c, copper  Continue optimizing electrolytes  Family open to PEG if required  Anemia of Critical Illness  No signs of bleeding Plan Continue to trend CBC daily  Transfuse if hgb <7   Fluid and electrolyte imbalance Plan Replace and recheck as needed   Hypothyroidism Plan Continue levothyroxine   HLD Plan  Continue statin per tube  Discharge Exam: BP (!) 111/59   Pulse 98   Temp (!) 100.6 F (38.1 C) (Oral) Comment: reported to rn  Resp 20   Ht 5\' 4"  (1.626 m)   Wt 53.5 kg   SpO2 98%   BMI 20.25 kg/m   General this is a chronically ill appearing 72 year old female who is laying in bed. Currently in no acute distress HENT NCAT 6 cuffed trach is unremarkable w/ no sig secretions Pulm shallow but non-labored resp effort. Currently on 28% ATC, sats mid 90s Card rrr Abd soft still has liq stool w/ flexi seal in place Ext warm and dry no sig edema Neuro awake, alert. Agitated at times. Will still reach for trach if allowed. Has generalized weakness but no focal def  GU cl yellow   Labs at discharge   Lab Results  Component Value Date   CREATININE 0.48 04/05/2023   BUN 32 (H) 04/05/2023   NA 137 04/05/2023   K 4.3 04/05/2023   CL 104 04/05/2023   CO2  25 04/05/2023   Lab Results  Component Value Date   WBC 11.8 (H) 04/04/2023   HGB 8.1 (L) 04/04/2023   HCT 25.9 (L) 04/04/2023   MCV 98.1 04/04/2023   PLT 389 04/04/2023   Lab Results  Component Value Date   ALT 26 03/27/2023   AST 29 03/27/2023   ALKPHOS 72 03/27/2023   BILITOT 0.4 03/27/2023   Lab Results  Component Value Date   INR 1.2 03/28/2023   INR 1.1 03/08/2023    Current radiological studies    No results found.  Disposition:    Discharge disposition: 63-Long Term Care       Discharge Instructions     Increase activity slowly   Complete by: As directed    No wound care   Complete by: As directed         Allergies as of 04/05/2023       Reactions   Zoloft [sertraline Hcl] Anxiety, Other (See Comments)   "crazy feeling"        Medication List     STOP taking these medications    Allergy Relief (Loratadine) 10 MG tablet Generic drug: loratadine   atenolol 50 MG tablet Commonly known as: TENORMIN   hydrOXYzine 25 MG tablet Commonly known as: ATARAX   LORazepam 2 MG tablet Commonly known as: Ativan   mirtazapine 30 MG tablet Commonly known as: REMERON   oxyCODONE-acetaminophen 5-325 MG tablet Commonly known as: PERCOCET/ROXICET   pantoprazole 20 MG tablet Commonly known as: PROTONIX Replaced by: pantoprazole 40 MG injection   traMADol 50 MG tablet Commonly known as: ULTRAM   venlafaxine XR 37.5 MG 24 hr capsule Commonly known as: EFFEXOR-XR Replaced by: venlafaxine 37.5 MG tablet       TAKE these medications    acetaminophen 325 MG tablet Commonly known as: TYLENOL Place 2 tablets (650 mg total) into feeding tube every 6 (six) hours as needed for fever or mild pain (pain score 1-3). What changed:  medication strength how much to take how to take this reasons to take this Another medication with the same name was removed. Continue taking this medication, and follow the directions you see here.   apixaban 5 MG Tabs tablet Commonly known as: ELIQUIS Place 1 tablet (5 mg total) into feeding tube 2 (two) times daily.   ascorbic acid 500 MG tablet Commonly known as: VITAMIN C Place 1 tablet (500 mg total) into feeding tube 2 (two) times daily.   Biotin 10 MG Tabs Place 1 tablet (10 mg total) into feeding tube daily. Start taking on: April 06, 2023   carvedilol 12.5 MG tablet Commonly known as: COREG Place 1 tablet (12.5 mg total) into feeding tube 2 (two) times daily with a meal.   clonazePAM 0.5 MG tablet Commonly known as: KLONOPIN Place 1 tablet (0.5 mg total) into feeding tube 2 (two) times daily.   copper tablet Place 1 tablet (2 mg  total) into feeding tube daily. Start taking on: April 06, 2023   docusate 50 MG/5ML liquid Commonly known as: COLACE Place 10 mLs (100 mg total) into feeding tube 2 (two) times daily.   donepezil 10 MG tablet Commonly known as: ARICEPT Place 1 tablet (10 mg total) into feeding tube at bedtime. What changed:  how to take this when to take this   feeding supplement (VITAL AF 1.2 CAL) Liqd Place 1,000 mLs into feeding tube continuous.   ipratropium-albuterol 0.5-2.5 (3) MG/3ML Soln Commonly known as: DUONEB Take 3 mLs  by nebulization every 6 (six) hours as needed.   levothyroxine 100 MCG tablet Commonly known as: SYNTHROID Place 1 tablet (100 mcg total) into feeding tube daily before breakfast. Start taking on: April 06, 2023 What changed: how to take this   lip balm ointment Apply topically as needed.   liver oil-zinc oxide 40 % ointment Commonly known as: DESITIN Apply topically every 8 (eight) hours as needed for irritation.   mouth rinse Liqd solution 15 mLs by Mouth Rinse route every 2 (two) hours.   multivitamin Liqd Place 15 mLs into feeding tube daily. Start taking on: April 06, 2023   OLANZapine injection Commonly known as: ZYPREXA Inject 1 mL (5 mg total) into the muscle every 6 (six) hours as needed for agitation.   oxyCODONE 5 MG/5ML solution Commonly known as: ROXICODONE Place 5 mLs (5 mg total) into feeding tube every 4 (four) hours as needed for severe pain (pain score 7-10) or moderate pain (pain score 4-6).   pantoprazole 40 MG injection Commonly known as: PROTONIX Inject 40 mg into the vein at bedtime. Replaces: pantoprazole 20 MG tablet   polyethylene glycol 17 g packet Commonly known as: MIRALAX / GLYCOLAX Place 17 g into feeding tube daily. Start taking on: April 06, 2023   potassium chloride 20 MEQ packet Commonly known as: KLOR-CON Place 40 mEq into feeding tube 2 (two) times daily.   pyridoxine 100 MG tablet Commonly  known as: B-6 Place 1 tablet (100 mg total) into feeding tube daily. Start taking on: April 06, 2023   QUEtiapine 50 MG tablet Commonly known as: SEROQUEL Place 1 tablet (50 mg total) into feeding tube 2 (two) times daily.   rosuvastatin 10 MG tablet Commonly known as: CRESTOR Place 1 tablet (10 mg total) into feeding tube daily. Start taking on: April 06, 2023 What changed:  how to take this when to take this   sodium chloride flush 0.9 % Soln Commonly known as: NS 10-40 mLs by Intracatheter route every 12 (twelve) hours.   sodium chloride flush 0.9 % Soln Commonly known as: NS 10-40 mLs by Intracatheter route as needed (flush).   thiamine 100 MG tablet Commonly known as: Vitamin B-1 Place 1 tablet (100 mg total) into feeding tube daily. Start taking on: April 06, 2023   venlafaxine 37.5 MG tablet Commonly known as: EFFEXOR Place 1 tablet (37.5 mg total) into feeding tube 2 (two) times daily. Replaces: venlafaxine XR 37.5 MG 24 hr capsule         Follow-up appointment   As indicated  Discharge Condition:    fair  Physician Statement:   The Patient was personally examined, the discharge assessment and plan has been personally reviewed and I agree with ACNP Myasia Sinatra's assessment and plan. 45 minutes of time have been dedicated to discharge assessment, planning and discharge instructions.   Signed: Shelby Mattocks 04/05/2023, 10:08 AM

## 2023-04-04 NOTE — Progress Notes (Signed)
Patient placed back on full vent support at 21:04 due to shallow breathing and dropping SpO2.  Patient resting comfortably.

## 2023-04-04 NOTE — Progress Notes (Signed)
RT removed the Pt's sutures and placed a new trach tie on the Pt. The pt had Red frank blood at the end of the inner cannula. The Pt was able to clear sputum from her throat and it was yellow

## 2023-04-04 NOTE — Progress Notes (Signed)
Physical Therapy Treatment Patient Details Name: Karen [name] MRN: [id number] DOB: October 09, 1950 Today's Date: 04/04/2023   History of Present Illness Patient is a 72 year old female who presented on 11/20 with hypoxia and AMS from scheduled colonoscopy. Patient was provided with narcan and placed on BiPAP. Patient was intubated on 11/21 with sedation and tremors. Patient was extubated on 11/30 onto BiPAP. Patient developed mucus plug on 12/2 with reintubation on 12/4 with bronch. Patient with reduced prece dex. 12/10 patient underwent trach placement. 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  PMH: HTN, major depressive disorder, cognitive impairment, hypothyroidism, arthritis.    PT Comments  Pt seen in ICU RM # [id number] General Comments: pt awake with fair eye contact and following repeat simple commands.  Appears partially groggy but awake enough to participate.  Non verbal TRACH.  B wrist restraints and mittens to avoid pulling lines/tubes. Pt remains on TRACH 5 L/min 30% Assisted OOB to recliner with RN.   General bed mobility comments: + 2 Total Asisst to roll pt <5% then total Asisst + 2 sidelying to EOB Pt 0%.  Once positioned upright/midline, pt was able to static sit EOB with Mod Assist x 3 min before showing signs of fatigue. General transfer comment: "Bear Hug" Total Asisst from elevated bed to recliner positioned 1/4 pivot.  Pt able to stand and support her weight briefly but unable to pivot step due to weakness.  MAXI MOVE pad in recliner for return to bed via LIFT. Positioned in recliner with multiple pillows. Spouse entered room at end of session pleased to see pt OOB.   Rec LIFT back to bed.  LPT has rec LTAC.    If plan is discharge home, recommend the following:     Can travel by private vehicle        Equipment Recommendations  None recommended by PT    Recommendations for Other Services       Precautions /  Restrictions Precautions Precautions: Fall Precaution Comments: small bor feeding tube, trach, fecal management system Restrictions Weight Bearing Restrictions Per Provider Order: No     Mobility  Bed Mobility Overal bed mobility: Needs Assistance Bed Mobility: Rolling, Sidelying to Sit Rolling: Total assist, +2 for physical assistance, +2 for safety/equipment Sidelying to sit: Max assist, +2 for physical assistance, +2 for safety/equipment       General bed mobility comments: + 2 Total Asisst to roll pt <5% then total Asisst + 2 sidelying to EOB Pt 0%.  Once positioned upright/midline, pt was able to static sit EOB with Mod Assist x 3 min before showing signs of fatigue.    Transfers Overall transfer level: Needs assistance Equipment used: None Transfers: Bed to chair/wheelchair/BSC   Stand pivot transfers: Total assist, +2 physical assistance, +2 safety/equipment         General transfer comment: "Bear Hug" Total Asisst from elevated bed to recliner positioned 1/4 pivot.  Pt able to stand and support her weight briefly but unable to pivot step due to weakness.  MAXI MOVE pad in recliner for return to bed via LIFT.    Ambulation/Gait               General Gait Details: non amb   Stairs             Wheelchair Mobility     Tilt Bed    Modified Rankin (Stroke Patients Only)       Balance  Cognition Arousal: Lethargic Behavior During Therapy: Flat affect Overall Cognitive Status: Impaired/Different from baseline Area of Impairment: Following commands, Awareness                       Following Commands: Follows one step commands inconsistently   Awareness: Intellectual   General Comments: pt awake with fair eye contact and following repeat simple commands.  Appears partially groggy but awake enough to participate.  Non verbal TRACH.  B wrist restraints and mittens to avoid  pulling lines/tubes.        Exercises      General Comments        Pertinent Vitals/Pain Pain Assessment Pain Assessment: No/denies pain    Home Living                          Prior Function            PT Goals (current goals can now be found in the care plan section) Progress towards PT goals: Progressing toward goals    Frequency    Min 1X/week      PT Plan      Co-evaluation              AM-PAC PT "6 Clicks" Mobility   Outcome Measure  Help needed turning from your back to your side while in a flat bed without using bedrails?: Total Help needed moving from lying on your back to sitting on the side of a flat bed without using bedrails?: Total Help needed moving to and from a bed to a chair (including a wheelchair)?: Total Help needed standing up from a chair using your arms (e.g., wheelchair or bedside chair)?: Total Help needed to walk in hospital room?: Total Help needed climbing 3-5 steps with a railing? : Total 6 Click Score: 6    End of Session Equipment Utilized During Treatment: Oxygen;Gait belt Activity Tolerance: Patient limited by fatigue Patient left: in chair;with call bell/phone within reach;with chair alarm set;with family/visitor present;with nursing/sitter in room Nurse Communication: Mobility status;Need for lift equipment PT Visit Diagnosis: Unsteadiness on feet (R26.81);Muscle weakness (generalized) (M62.81);Difficulty in walking, not elsewhere classified (R26.2)     Time: 8295-6213 PT Time Calculation (min) (ACUTE ONLY): 28 min  Charges:    $Therapeutic Activity: 23-37 mins PT General Charges $$ ACUTE PT VISIT: 1 Visit                     Felecia Shelling  PTA Acute  Rehabilitation Services Office M-F          [phone]

## 2023-04-04 NOTE — Progress Notes (Signed)
NAME:  Karen [name], MRN:  [id number], DOB:  21-Jan-1951, LOS: 27 ADMISSION DATE:  03/08/2023, CONSULTATION DATE:  03/08/23  REFERRING MD: MD Halivand CHIEF COMPLAINT:  AMS, hypoxia   History of Present Illness:  Pt is a 72 yr old female with a significant pmh of HTN, Hypothyroidism, Major Depressive Disorder, cognitive impairment, arthritis, hearing loss, 30-40lb unintentional weight loss since 05/2022. Per patient's husband and daughter endorse that patient has had a functional and cognitive decline since the beginning of the 2024. Patient has had an extensive workup in regards to cognitive impairment and unintentional weight loss with MRI Brain, MRCPs, CT of chest and abdomen with no definitive etiology.   Patient presented to [facility] ED with AMS and hypoxia (O2 Sats in 70s-80s) by EMS from [facility], who was scheduled for a colonoscopy on 11/20. Patient did not receive colonoscopy due to hypoxia and AMS. Initial VBG revealed pH 7.17 and pCO2 > 123. Patient was given narcan during the initial ED workup with no response and subsequently placed on BIPAP due to increasing hypoxia and respiratory distress. Repeat VBG showed no improvement and patient was intubated due to hypoxia/hypercarbia. Also, CT of chest obtained by EDP showed evidence of scattered subsegmental PE and PCCM was consulted for further vent management as well as management for PE.   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    reports that she quit smoking about 22 years ago. Her smoking use included cigarettes. She started smoking about 32 years ago. She has never used smokeless tobacco.   has a past surgical history that includes Cesarean section; Colonoscopy with propofol (N/A, 11/17/2015); Colonoscopy; and Polypectomy.  Significant Hospital Events: Including procedures, antibiotic start and stop dates in addition to other pertinent events   11/20 Admit with AMS, acute hypoxic/hypercarbic respiratory failure, subsegmental PE intubated No RV strain on CT.  No emphysema reported on CT. PCCM consult EF 70%.  Elevated pulm artery pressures 53.  Right ventricle normal. RVP NEG MRSA PCR NEG 11/21 Intubated, sedated, tremors  11/22 Febrile, tachycardic, shivering/tremors seen with physical touch. IR LP obtained > negative 11/23 Neuro consulted. MRI brain ordered 11/25 TRAC ASPIRATE > RARE MSSA 11/28 weaning but high respiratory rate, pressure support of 10 11/30 extubated.  Requiring BiPAP.  Transfused 1 unit packed red cells 12/2 mucus plug reintubated, bronch with therapeutic aspiration of RUL, LUL, lingula, bronchus intermedius TRACH ASPIRATE RARE MSSA 12/3 CXR improved, extubated 12/4 reintubation after further discussions with family with bronch 12/5 MRI of C-Spine-neg for spinal infection/cord compression, remains on vent.  12/6 patient following commands, on precedex, will need to readdress GOC with patient and patient family  - Intubated/Sedated but responding  GOC iPAL by Dr Chestine Spore 12.7/24 FiO2 30% on the ventilator fentanyl infusion present Precedex infusion.  Ongoing heparin infusion and tube feeds.  Is on Unasyn.  Has Foley catheter.  afebrile with a white count 7000.  +13.75 L since admission. Per RN - family  interested in course of steroids 12/9  Following commands, on fentanyl/precedex gtt  12/10 Bedside trach following commands, on fentanyl gtt  12/11 No issues overnight  12/12 Periods of apnea on SBT this am, stop fentanyl drip. Tolerated TC x 9 hours 12/13 -12/15 Tolerated ATC 12/16- Trache collar/still needing vent at night, PMV trial unsuccessful, weaning precedex 12/17 Weaning off precedex, adding per tube adjuncts, continue trache collar   Interim History / Subjective:  Able to wean down precedex, agitation overnight-zyprexa added  Currently on Trache collar   Objective   Blood pressure (!) 100/53, pulse 83, temperature 98.1 F (36.7 C), temperature source Axillary, resp. rate (!) 27, height 5\' 4"  (1.626 m), weight 50.5 kg, SpO2 97%.    Vent Mode: PRVC FiO2 (%):  [28 %-30 %] 28 % Set Rate:  [24 bmp] 24 bmp Vt Set:  [440 mL] 440 mL PEEP:  [5 cmH20] 5 cmH20 Plateau Pressure:  [20 cmH20-24 cmH20] 20 cmH20   Intake/Output Summary (Last 24 hours) at 04/04/2023 0933 Last data filed at 04/04/2023 4401 Gross per 24 hour  Intake 1871.19 ml  Output 2844 ml  Net -972.81 ml   Filed Weights   04/02/23 0500 04/03/23 0700 04/04/23 0500  Weight: 51.4 kg 51.5 kg 50.5 kg   Physical Exam: General: chronically ill appearing female, on vent, lying in ICU bed  HENT: Normocephalic, #6 shiley trache, NG tuve, poor dentition/missing teeth Pink MM  Respiratory: clear, diminished throughout lung bases, no respiratory distress  Cardiovascular: s1, s2, RRR, no MRG, no JVD  GI: soft, BS active  Extremities: 1 + edema generalized, moves extremities to commands Neuro: Alert, follows commands, RASS 0    Resolved problems:   Hypoglycemia  Fluid Overload  Acute hypoxemic respiratory failure  MSSA VAP (completed rx)  Assessment & Plan:  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 Possible medication related however tolerating precedex.  Repeat BC 1/3 reveal staph species- more likely contaminate  Echo 12/15 EF 65-70%, no regional wall abnormalities  Has been receiving lasix due to fluid positive status, currently still ~ 1.5L +  P:  Continue to trend BPs with VS Continue to wean precedex gtt off  Still fluid positive, give additional 40mg  of lasix   Subsegmental PEs  Repeat Vas Ultrasound-negative for DVTs  P: Continue eliquis   Failure to Thrive w/ Progressive weakness  Malnutrition  Low vitamin B7, copper, and vitamin C Secondary to neurocognitive disorder (dementia) vs chronic aspiration had extensive outpatient with unrevealing results to date including negative LP and MRI C-spine   P: Continue tube feeding per small bore feeding tube with RD recs Continue supplementing thiamine, vitamin c, copper  Continue optimizing electrolytes   Hypomagnesemia  P: Labs pending, f/u   Anemia of Critical Illness  No signs of bleeding P: Continue to trend CBC daily  Transfuse if hgb <7   HTN  HLD Episodes of hypotension in setting of sedation vs diuresis  P: Continue to cardiac monitoring with VS monitoring  Continue statin per tube   Hypothyroidism P: Continue levothyroxine   Anxiety  Depression  Dementia P:  Continue to wean off precedex  Continue Aricept  Increase Effexor to 37.5 mg BID Add Seroquel at bedtime 12.5 mg  Continue Oxycodone and Zyprexa PRN  Continue to Monitor QTC, Obtain EKG   Best Practice (right click and "Reselect all SmartList Selections" daily)   Diet/type: TUBE  FEEDS DVT prophylaxis: DOAC  Pressure ulcer(s). Stage 2 on Buttocks BL  GI prophylaxis: PPI Lines: N/A Foley:  Yes, and it is still needed Code Status:  full code Last date of multidisciplinary goals of care discussion: 12/10  CC time: 30 mins  Christian Carnell Beavers AGACNP-BC   [facility] 04/04/2023, 9:33 AM  Please see [email or web address] for pager details.  From 7A-7P if no response, please call [phone]. After hours, please call ELink [phone].

## 2023-04-04 NOTE — Plan of Care (Signed)
  Problem: Clinical Measurements: Goal: Respiratory complications will improve Outcome: Progressing Goal: Cardiovascular complication will be avoided Outcome: Progressing   Problem: Nutrition: Goal: Adequate nutrition will be maintained Outcome: Progressing   Problem: Pain Management: Goal: General experience of comfort will improve Outcome: Progressing   Problem: Activity: Goal: Risk for activity intolerance will decrease Outcome: Not Progressing   Problem: Coping: Goal: Level of anxiety will decrease Outcome: Not Progressing   Problem: Safety: Goal: Ability to remain free from injury will improve Outcome: Not Progressing   Problem: Role Relationship: Goal: Ability to communicate will improve Outcome: Not Progressing

## 2023-04-05 ENCOUNTER — Other Ambulatory Visit (HOSPITAL_COMMUNITY): Payer: Self-pay

## 2023-04-05 ENCOUNTER — Inpatient Hospital Stay (HOSPITAL_COMMUNITY)
Admission: AD | Admit: 2023-04-05 | Discharge: 2023-05-15 | Disposition: A | Discharge status: Skilled Nursing Facility | Payer: Self-pay | Source: Ambulatory Visit | Attending: Internal Medicine | Admitting: Internal Medicine

## 2023-04-05 DIAGNOSIS — J9811 Atelectasis: Secondary | ICD-10-CM | POA: Diagnosis not present

## 2023-04-05 DIAGNOSIS — J95851 Ventilator associated pneumonia: Secondary | ICD-10-CM | POA: Diagnosis not present

## 2023-04-05 DIAGNOSIS — R000 Tachycardia, unspecified: Secondary | ICD-10-CM | POA: Diagnosis not present

## 2023-04-05 DIAGNOSIS — J9601 Acute respiratory failure with hypoxia: Secondary | ICD-10-CM | POA: Diagnosis not present

## 2023-04-05 DIAGNOSIS — J15211 Pneumonia due to Methicillin susceptible Staphylococcus aureus: Secondary | ICD-10-CM | POA: Diagnosis not present

## 2023-04-05 DIAGNOSIS — Z9911 Dependence on respirator [ventilator] status: Secondary | ICD-10-CM

## 2023-04-05 DIAGNOSIS — E7849 Other hyperlipidemia: Secondary | ICD-10-CM | POA: Diagnosis not present

## 2023-04-05 DIAGNOSIS — I10 Essential (primary) hypertension: Secondary | ICD-10-CM | POA: Diagnosis not present

## 2023-04-05 DIAGNOSIS — I2693 Single subsegmental pulmonary embolism without acute cor pulmonale: Secondary | ICD-10-CM | POA: Diagnosis not present

## 2023-04-05 DIAGNOSIS — N390 Urinary tract infection, site not specified: Secondary | ICD-10-CM | POA: Diagnosis not present

## 2023-04-05 DIAGNOSIS — E039 Hypothyroidism, unspecified: Secondary | ICD-10-CM | POA: Diagnosis not present

## 2023-04-05 DIAGNOSIS — J9600 Acute respiratory failure, unspecified whether with hypoxia or hypercapnia: Secondary | ICD-10-CM | POA: Diagnosis not present

## 2023-04-05 DIAGNOSIS — I422 Other hypertrophic cardiomyopathy: Secondary | ICD-10-CM | POA: Diagnosis not present

## 2023-04-05 DIAGNOSIS — R0689 Other abnormalities of breathing: Secondary | ICD-10-CM | POA: Diagnosis not present

## 2023-04-05 DIAGNOSIS — I425 Other restrictive cardiomyopathy: Secondary | ICD-10-CM | POA: Diagnosis not present

## 2023-04-05 DIAGNOSIS — Z431 Encounter for attention to gastrostomy: Secondary | ICD-10-CM | POA: Diagnosis not present

## 2023-04-05 DIAGNOSIS — R1310 Dysphagia, unspecified: Secondary | ICD-10-CM | POA: Diagnosis not present

## 2023-04-05 DIAGNOSIS — F03911 Unspecified dementia, unspecified severity, with agitation: Secondary | ICD-10-CM | POA: Diagnosis not present

## 2023-04-05 DIAGNOSIS — Z743 Need for continuous supervision: Secondary | ICD-10-CM | POA: Diagnosis not present

## 2023-04-05 DIAGNOSIS — Z430 Encounter for attention to tracheostomy: Secondary | ICD-10-CM | POA: Diagnosis not present

## 2023-04-05 DIAGNOSIS — Z8701 Personal history of pneumonia (recurrent): Secondary | ICD-10-CM | POA: Diagnosis not present

## 2023-04-05 DIAGNOSIS — D500 Iron deficiency anemia secondary to blood loss (chronic): Secondary | ICD-10-CM | POA: Diagnosis not present

## 2023-04-05 DIAGNOSIS — R1311 Dysphagia, oral phase: Secondary | ICD-10-CM | POA: Diagnosis not present

## 2023-04-05 DIAGNOSIS — R404 Transient alteration of awareness: Secondary | ICD-10-CM | POA: Diagnosis not present

## 2023-04-05 DIAGNOSIS — J151 Pneumonia due to Pseudomonas: Secondary | ICD-10-CM | POA: Diagnosis not present

## 2023-04-05 DIAGNOSIS — I2694 Multiple subsegmental pulmonary emboli without acute cor pulmonale: Secondary | ICD-10-CM | POA: Diagnosis not present

## 2023-04-05 DIAGNOSIS — G9341 Metabolic encephalopathy: Secondary | ICD-10-CM | POA: Diagnosis not present

## 2023-04-05 DIAGNOSIS — I361 Nonrheumatic tricuspid (valve) insufficiency: Secondary | ICD-10-CM | POA: Diagnosis not present

## 2023-04-05 DIAGNOSIS — J9621 Acute and chronic respiratory failure with hypoxia: Secondary | ICD-10-CM | POA: Diagnosis not present

## 2023-04-05 DIAGNOSIS — R451 Restlessness and agitation: Secondary | ICD-10-CM | POA: Diagnosis not present

## 2023-04-05 DIAGNOSIS — J90 Pleural effusion, not elsewhere classified: Secondary | ICD-10-CM | POA: Diagnosis not present

## 2023-04-05 DIAGNOSIS — E46 Unspecified protein-calorie malnutrition: Secondary | ICD-10-CM | POA: Diagnosis not present

## 2023-04-05 DIAGNOSIS — Z930 Tracheostomy status: Secondary | ICD-10-CM | POA: Diagnosis not present

## 2023-04-05 LAB — GLUCOSE, CAPILLARY
Glucose-Capillary: 118 mg/dL — ABNORMAL HIGH (ref 70–99)
Glucose-Capillary: 132 mg/dL — ABNORMAL HIGH (ref 70–99)
Glucose-Capillary: 124 mg/dL — ABNORMAL HIGH (ref 70–99)

## 2023-04-05 LAB — BASIC METABOLIC PANEL WITH GFR
Anion gap: 5 (ref 5–15)
BUN: 21 mg/dL (ref 8–23)
CO2: 30 mmol/L (ref 22–32)
Calcium: 10.4 mg/dL — ABNORMAL HIGH (ref 8.9–10.3)
Chloride: 106 mmol/L (ref 98–111)
Creatinine, Ser: 0.61 mg/dL (ref 0.44–1.00)
GFR, Estimated: >60 mL/min
Glucose, Bld: 124 mg/dL — ABNORMAL HIGH (ref 70–99)
Potassium: 4.7 mmol/L (ref 3.5–5.1)
Sodium: 141 mmol/L (ref 135–145)

## 2023-04-05 LAB — CBC
HCT: 29.1 % — ABNORMAL LOW (ref 36.0–46.0)
Hemoglobin: 8.8 g/dL — ABNORMAL LOW (ref 12.0–15.0)
MCH: 29.7 pg (ref 26.0–34.0)
MCHC: 30.2 g/dL (ref 30.0–36.0)
MCV: 98.3 fL (ref 80.0–100.0)
Platelets: 361 10*3/uL (ref 150–400)
RBC: 2.96 MIL/uL — ABNORMAL LOW (ref 3.87–5.11)
RDW: 14.9 % (ref 11.5–15.5)
WBC: 11.4 10*3/uL — ABNORMAL HIGH (ref 4.0–10.5)
nRBC: 0 % (ref 0.0–0.2)

## 2023-04-05 LAB — CULTURE, BLOOD (ROUTINE X 2)

## 2023-04-05 LAB — TROPONIN I (HIGH SENSITIVITY): Troponin I (High Sensitivity): 8 ng/L (ref <18)

## 2023-04-05 LAB — BASIC METABOLIC PANEL
Anion gap: 8 (ref 5–15)
BUN: 32 mg/dL — ABNORMAL HIGH (ref 8–23)
CO2: 25 mmol/L (ref 22–32)
Calcium: 9.9 mg/dL (ref 8.9–10.3)
Chloride: 104 mmol/L (ref 98–111)
Creatinine, Ser: 0.48 mg/dL (ref 0.44–1.00)
GFR, Estimated: >60 mL/min (ref >60)
Glucose, Bld: 132 mg/dL — ABNORMAL HIGH (ref 70–99)
Potassium: 4.3 mmol/L (ref 3.5–5.1)
Sodium: 137 mmol/L (ref 135–145)

## 2023-04-05 LAB — PHOSPHORUS: Phosphorus: 2.2 mg/dL — ABNORMAL LOW (ref 2.5–4.6)

## 2023-04-05 LAB — MAGNESIUM: Magnesium: 1.6 mg/dL — ABNORMAL LOW (ref 1.7–2.4)

## 2023-04-05 MED ORDER — VITAL AF 1.2 CAL PO LIQD: 1000.0000 mL | ORAL | Status: AC

## 2023-04-05 MED ORDER — ADULT MULTIVITAMIN LIQUID CH: 15.0000 mL | Freq: Every day | ORAL | Status: AC

## 2023-04-05 MED ORDER — ROSUVASTATIN CALCIUM 10 MG PO TABS: 10.0000 mg | ORAL_TABLET | Freq: Every day | ORAL | Status: AC

## 2023-04-05 MED ORDER — LEVOTHYROXINE SODIUM 100 MCG PO TABS: 100.0000 ug | ORAL_TABLET | Freq: Every day | ORAL | Status: AC

## 2023-04-05 MED ORDER — BIOTIN 10 MG PO TABS: 10.0000 mg | ORAL_TABLET | Freq: Every day | ORAL | Status: AC

## 2023-04-05 MED ORDER — POTASSIUM CHLORIDE 20 MEQ PO PACK: 40.0000 meq | PACK | Freq: Two times a day (BID) | ORAL | Status: AC

## 2023-04-05 MED ORDER — POLYETHYLENE GLYCOL 3350 17 G PO PACK: 17.0000 g | PACK | Freq: Every day | ORAL | Status: AC

## 2023-04-05 MED ORDER — MAGNESIUM SULFATE 2 GM/50ML IV SOLN
2.0000 g | Freq: Once | INTRAVENOUS | Status: AC
  Administered 2023-04-05: 2 g via INTRAVENOUS
  Filled 2023-04-05: qty 50

## 2023-04-05 MED ORDER — ORAL CARE MOUTH RINSE: 15.0000 mL | OROMUCOSAL | Status: AC

## 2023-04-05 MED ORDER — ACETAMINOPHEN 325 MG PO TABS: 650.0000 mg | ORAL_TABLET | Freq: Four times a day (QID) | ORAL | Status: AC | PRN

## 2023-04-05 MED ORDER — VITAMIN B-1 100 MG PO TABS: 100.0000 mg | ORAL_TABLET | Freq: Every day | ORAL | Status: AC

## 2023-04-05 MED ORDER — DOCUSATE SODIUM 50 MG/5ML PO LIQD: 100.0000 mg | Freq: Two times a day (BID) | ORAL | Status: AC

## 2023-04-05 MED ORDER — APIXABAN 5 MG PO TABS: 5.0000 mg | ORAL_TABLET | Freq: Two times a day (BID) | ORAL | Status: AC

## 2023-04-05 MED ORDER — QUETIAPINE FUMARATE 50 MG PO TABS: 50.0000 mg | ORAL_TABLET | Freq: Two times a day (BID) | ORAL | Status: DC

## 2023-04-05 MED ORDER — CARMEX CLASSIC LIP BALM EX OINT: TOPICAL_OINTMENT | CUTANEOUS | Status: AC | PRN

## 2023-04-05 MED ORDER — QUETIAPINE FUMARATE 50 MG PO TABS: 50.0000 mg | ORAL_TABLET | Freq: Two times a day (BID) | ORAL | Status: AC

## 2023-04-05 MED ORDER — OXYCODONE HCL 5 MG/5ML PO SOLN: 5.0000 mg | ORAL | Status: AC | PRN

## 2023-04-05 MED ORDER — PYRIDOXINE HCL 100 MG PO TABS: 100.0000 mg | ORAL_TABLET | Freq: Every day | ORAL | Status: AC

## 2023-04-05 MED ORDER — CLONAZEPAM 0.5 MG PO TABS: 0.5000 mg | ORAL_TABLET | Freq: Two times a day (BID) | ORAL | Status: AC

## 2023-04-05 MED ORDER — IPRATROPIUM-ALBUTEROL 0.5-2.5 (3) MG/3ML IN SOLN: 3.0000 mL | Freq: Four times a day (QID) | RESPIRATORY_TRACT | Status: AC | PRN

## 2023-04-05 MED ORDER — SODIUM CHLORIDE 0.9% FLUSH: 10.0000 mL | Freq: Two times a day (BID) | INTRAVENOUS | Status: AC

## 2023-04-05 MED ORDER — ASCORBIC ACID 500 MG PO TABS: 500.0000 mg | ORAL_TABLET | Freq: Two times a day (BID) | ORAL | Status: AC

## 2023-04-05 MED ORDER — SODIUM CHLORIDE 0.9% FLUSH: 10.0000 mL | INTRAVENOUS | Status: AC | PRN

## 2023-04-05 MED ORDER — COPPER 2 MG PO TABS: 2.0000 mg | Freq: Every day | Status: AC

## 2023-04-05 MED ORDER — SODIUM PHOSPHATES 45 MMOLE/15ML IV SOLN
15.0000 mmol | Freq: Once | INTRAVENOUS | Status: DC
  Administered 2023-04-05: 15 mmol via INTRAVENOUS
  Filled 2023-04-05: qty 5

## 2023-04-05 MED ORDER — CARVEDILOL 12.5 MG PO TABS: 12.5000 mg | ORAL_TABLET | Freq: Two times a day (BID) | ORAL | Status: AC

## 2023-04-05 MED ORDER — DONEPEZIL HCL 10 MG PO TABS: 10.0000 mg | ORAL_TABLET | Freq: Every day | ORAL | Status: AC

## 2023-04-05 MED ORDER — OLANZAPINE 10 MG IM SOLR: 5.0000 mg | Freq: Four times a day (QID) | INTRAMUSCULAR | Status: AC | PRN

## 2023-04-05 MED ORDER — PANTOPRAZOLE SODIUM 40 MG IV SOLR: 40.0000 mg | Freq: Every day | INTRAVENOUS | Status: AC

## 2023-04-05 MED ORDER — ZINC OXIDE 40 % EX OINT: TOPICAL_OINTMENT | Freq: Three times a day (TID) | CUTANEOUS | Status: AC | PRN

## 2023-04-05 MED ORDER — VENLAFAXINE HCL 37.5 MG PO TABS: 37.5000 mg | ORAL_TABLET | Freq: Two times a day (BID) | ORAL | Status: AC

## 2023-04-05 NOTE — Plan of Care (Signed)
  Problem: Education: Goal: Knowledge of General Education information will improve Description: Including pain rating scale, medication(s)/side effects and non-pharmacologic comfort measures Outcome: Adequate for Discharge   Problem: Health Behavior/Discharge Planning: Goal: Ability to manage health-related needs will improve Outcome: Adequate for Discharge   Problem: Clinical Measurements: Goal: Ability to maintain clinical measurements within normal limits will improve Outcome: Adequate for Discharge Goal: Will remain free from infection Outcome: Adequate for Discharge Goal: Diagnostic test results will improve Outcome: Adequate for Discharge Goal: Respiratory complications will improve Outcome: Adequate for Discharge Goal: Cardiovascular complication will be avoided Outcome: Adequate for Discharge   Problem: Activity: Goal: Risk for activity intolerance will decrease Outcome: Adequate for Discharge   Problem: Nutrition: Goal: Adequate nutrition will be maintained Outcome: Adequate for Discharge   Problem: Coping: Goal: Level of anxiety will decrease Outcome: Adequate for Discharge   Problem: Elimination: Goal: Will not experience complications related to urinary retention Outcome: Adequate for Discharge   Problem: Pain Management: Goal: General experience of comfort will improve Outcome: Adequate for Discharge   Problem: Safety: Goal: Ability to remain free from injury will improve Outcome: Adequate for Discharge   Problem: Skin Integrity: Goal: Risk for impaired skin integrity will decrease Outcome: Adequate for Discharge   Problem: Education: Goal: Knowledge about tracheostomy care/management will improve Outcome: Adequate for Discharge   Problem: Activity: Goal: Ability to tolerate increased activity will improve Outcome: Adequate for Discharge   Problem: Health Behavior/Discharge Planning: Goal: Ability to manage tracheostomy will improve Outcome:  Adequate for Discharge   Problem: Respiratory: Goal: Patent airway maintenance will improve Outcome: Adequate for Discharge   Problem: Role Relationship: Goal: Ability to communicate will improve Outcome: Adequate for Discharge

## 2023-04-05 NOTE — Plan of Care (Signed)
  Problem: Clinical Measurements: Goal: Respiratory complications will improve Outcome: Progressing   Problem: Activity: Goal: Risk for activity intolerance will decrease Outcome: Progressing   Problem: Nutrition: Goal: Adequate nutrition will be maintained Outcome: Progressing   Problem: Pain Management: Goal: General experience of comfort will improve Outcome: Progressing   Problem: Safety: Goal: Ability to remain free from injury will improve Outcome: Progressing   Problem: Skin Integrity: Goal: Risk for impaired skin integrity will decrease Outcome: Progressing   Problem: Respiratory: Goal: Patent airway maintenance will improve Outcome: Progressing

## 2023-04-05 NOTE — Progress Notes (Signed)
OT Cancellation Note  Patient Details Name: Karen [name] MRN: [id number] DOB: [birth year]   Cancelled Treatment:    Reason Eval/Treat Not Completed: Other (comment) Patient plan to d/c to [facility] today. OT to continue to follow in next level of care.  Rosalio Loud, MS Acute Rehabilitation Department Office# [phone]  04/05/2023, 10:40 AM

## 2023-04-05 NOTE — Progress Notes (Signed)
After TOC ensured all proper arrangements were made for transfer, this RN went over AVS as ordered with patient's daughter [name]. AVS placed in transfer packet for receiving facility. Education and care plan reviewed. Report given to receiving RN at [facility] [place]. All patient's personal belongings taken by patient's daughter [name]. TF paused for [facility] transfer. Patient was transported on [facility] by [facility] staff.

## 2023-04-05 NOTE — TOC Transition Note (Signed)
Transition of Care [facility]) - Discharge Note   Patient Details  Name: Karen [name] MRN: [id number] Date of Birth: 10/18/1950  [facility] CM/SW Contact:  Armanda Heritage, RN Phone Number: 04/05/2023, 10:38 AM   Clinical Narrative:    CM has confirmed with [facility] rep will that insurance Berkley Harvey has been received.  Patient will discharge to room 5E18, report number [phone].  Accepting physician is Dr Luna Kitchens.  Bedside RN notified, will need to set up [facility] transport.  No further TOC needs identified at this time.    Final next level of care: Long Term Acute Care (LTAC) Barriers to Discharge: No Barriers Identified   Patient Goals and CMS Choice Patient states their goals for this hospitalization and ongoing recovery are:: patient will return home per [name] CMS Medicare.gov Compare Post Acute Care list provided to:: Patient Represenative (must comment) ([name])        Discharge Placement                Patient to be transferred to facility by: [facility]      Discharge Plan and Services Additional resources added to the After Visit Summary for     Discharge Planning Services: CM Consult                                 Social Drivers of Health (SDOH) Interventions SDOH Screenings   Food Insecurity: No Food Insecurity (03/09/2023)  Housing: Patient Unable To Answer (03/09/2023)  Transportation Needs: No Transportation Needs (03/09/2023)  Utilities: Not At Risk (03/09/2023)  Depression (PHQ2-9): Low Risk  (03/23/2022)  Tobacco Use: Medium Risk (03/18/2023)     Readmission Risk Interventions    03/09/2023    1:14 PM  Readmission Risk Prevention Plan  Transportation Screening Complete  PCP or Specialist Appt within 5-7 Days Complete  Home Care Screening Complete  Medication Review (RN CM) Complete

## 2023-04-06 ENCOUNTER — Other Ambulatory Visit (HOSPITAL_COMMUNITY): Payer: Self-pay

## 2023-04-06 DIAGNOSIS — I361 Nonrheumatic tricuspid (valve) insufficiency: Secondary | ICD-10-CM | POA: Diagnosis not present

## 2023-04-06 DIAGNOSIS — Z930 Tracheostomy status: Secondary | ICD-10-CM

## 2023-04-06 DIAGNOSIS — I2694 Multiple subsegmental pulmonary emboli without acute cor pulmonale: Secondary | ICD-10-CM

## 2023-04-06 DIAGNOSIS — D500 Iron deficiency anemia secondary to blood loss (chronic): Secondary | ICD-10-CM | POA: Diagnosis not present

## 2023-04-06 DIAGNOSIS — R1311 Dysphagia, oral phase: Secondary | ICD-10-CM

## 2023-04-06 DIAGNOSIS — J9621 Acute and chronic respiratory failure with hypoxia: Secondary | ICD-10-CM | POA: Diagnosis not present

## 2023-04-06 DIAGNOSIS — E7849 Other hyperlipidemia: Secondary | ICD-10-CM | POA: Diagnosis not present

## 2023-04-06 LAB — CULTURE, BLOOD (ROUTINE X 2)

## 2023-04-06 LAB — CBC WITH DIFFERENTIAL/PLATELET
Abs Immature Granulocytes: 0.29 10*3/uL — ABNORMAL HIGH (ref 0.00–0.07)
Basophils Absolute: 0.1 10*3/uL (ref 0.0–0.1)
Basophils Relative: 1 %
Eosinophils Absolute: 0.1 10*3/uL (ref 0.0–0.5)
Eosinophils Relative: 1 %
HCT: 28.5 % — ABNORMAL LOW (ref 36.0–46.0)
Hemoglobin: 8.7 g/dL — ABNORMAL LOW (ref 12.0–15.0)
Immature Granulocytes: 3 %
Lymphocytes Relative: 15 %
Lymphs Abs: 1.6 10*3/uL (ref 0.7–4.0)
MCH: 29.7 pg (ref 26.0–34.0)
MCHC: 30.5 g/dL (ref 30.0–36.0)
MCV: 97.3 fL (ref 80.0–100.0)
Monocytes Absolute: 0.7 10*3/uL (ref 0.1–1.0)
Monocytes Relative: 6 %
Neutro Abs: 8.3 10*3/uL — ABNORMAL HIGH (ref 1.7–7.7)
Neutrophils Relative %: 74 %
Platelets: 364 10*3/uL (ref 150–400)
RBC: 2.93 MIL/uL — ABNORMAL LOW (ref 3.87–5.11)
RDW: 14.8 % (ref 11.5–15.5)
WBC: 11 10*3/uL — ABNORMAL HIGH (ref 4.0–10.5)
nRBC: 0 % (ref 0.0–0.2)

## 2023-04-06 LAB — COMPREHENSIVE METABOLIC PANEL
ALT: 140 U/L — ABNORMAL HIGH (ref 0–44)
AST: 94 U/L — ABNORMAL HIGH (ref 15–41)
Albumin: 2.4 g/dL — ABNORMAL LOW (ref 3.5–5.0)
Alkaline Phosphatase: 165 U/L — ABNORMAL HIGH (ref 38–126)
Anion gap: 7 (ref 5–15)
BUN: 21 mg/dL (ref 8–23)
CO2: 28 mmol/L (ref 22–32)
Calcium: 10.5 mg/dL — ABNORMAL HIGH (ref 8.9–10.3)
Chloride: 105 mmol/L (ref 98–111)
Creatinine, Ser: 0.66 mg/dL (ref 0.44–1.00)
GFR, Estimated: >60 mL/min (ref >60)
Glucose, Bld: 122 mg/dL — ABNORMAL HIGH (ref 70–99)
Potassium: 4.2 mmol/L (ref 3.5–5.1)
Sodium: 140 mmol/L (ref 135–145)
Total Bilirubin: 0.4 mg/dL (ref <1.2)
Total Protein: 6.5 g/dL (ref 6.5–8.1)

## 2023-04-06 LAB — HEMOGLOBIN A1C
Hgb A1c MFr Bld: 4.6 % — ABNORMAL LOW (ref 4.8–5.6)
Mean Plasma Glucose: 85.32 mg/dL

## 2023-04-06 LAB — TSH: TSH: 0.962 u[IU]/mL (ref 0.350–4.500)

## 2023-04-07 DIAGNOSIS — R1311 Dysphagia, oral phase: Secondary | ICD-10-CM

## 2023-04-07 DIAGNOSIS — Z930 Tracheostomy status: Secondary | ICD-10-CM

## 2023-04-07 DIAGNOSIS — J9621 Acute and chronic respiratory failure with hypoxia: Secondary | ICD-10-CM

## 2023-04-07 DIAGNOSIS — I2694 Multiple subsegmental pulmonary emboli without acute cor pulmonale: Secondary | ICD-10-CM

## 2023-04-07 LAB — BLOOD GAS, ARTERIAL
Acid-Base Excess: 4.6 mmol/L — ABNORMAL HIGH (ref 0.0–2.0)
Bicarbonate: 33.8 mmol/L — ABNORMAL HIGH (ref 20.0–28.0)
O2 Saturation: 99.5 %
Patient temperature: 37
pCO2 arterial: 72 mm[Hg] (ref 32–48)
pH, Arterial: 7.28 — ABNORMAL LOW (ref 7.35–7.45)
pO2, Arterial: 144 mm[Hg] — ABNORMAL HIGH (ref 83–108)

## 2023-04-07 LAB — PARANEOPLASTIC AB
AGNA-1: NEGATIVE
Amphiphysin Antibody: NEGATIVE
Anti-Hu Ab: NEGATIVE
Anti-Ri Ab: NEGATIVE
Anti-Yo Ab: NEGATIVE
Antineruonal nuclear Ab Type 3: NEGATIVE
CASPR2 Antibody,Cell-based IFA: NEGATIVE
CRMP-5 IgG: NEGATIVE
Interpretation: NEGATIVE
LGI1 Antibody, Cell-based IFA: NEGATIVE
Purkinje Cell Cyto Ab Type 2: NEGATIVE
Purkinje Cell Cyto Ab Type Tr: NEGATIVE
VGCC Antibody: <1 pmol/L (ref 0.0–30.0)

## 2023-04-07 LAB — MAGNESIUM: Magnesium: 1.6 mg/dL — ABNORMAL LOW (ref 1.7–2.4)

## 2023-04-08 DIAGNOSIS — R1311 Dysphagia, oral phase: Secondary | ICD-10-CM

## 2023-04-08 DIAGNOSIS — J9621 Acute and chronic respiratory failure with hypoxia: Secondary | ICD-10-CM

## 2023-04-08 DIAGNOSIS — I2694 Multiple subsegmental pulmonary emboli without acute cor pulmonale: Secondary | ICD-10-CM

## 2023-04-08 DIAGNOSIS — Z930 Tracheostomy status: Secondary | ICD-10-CM

## 2023-04-08 LAB — MAGNESIUM: Magnesium: 1.7 mg/dL (ref 1.7–2.4)

## 2023-04-09 DIAGNOSIS — I2694 Multiple subsegmental pulmonary emboli without acute cor pulmonale: Secondary | ICD-10-CM

## 2023-04-09 DIAGNOSIS — Z930 Tracheostomy status: Secondary | ICD-10-CM

## 2023-04-09 DIAGNOSIS — R1311 Dysphagia, oral phase: Secondary | ICD-10-CM

## 2023-04-09 DIAGNOSIS — J9621 Acute and chronic respiratory failure with hypoxia: Secondary | ICD-10-CM

## 2023-04-10 DIAGNOSIS — I2694 Multiple subsegmental pulmonary emboli without acute cor pulmonale: Secondary | ICD-10-CM

## 2023-04-10 DIAGNOSIS — J9621 Acute and chronic respiratory failure with hypoxia: Secondary | ICD-10-CM

## 2023-04-10 DIAGNOSIS — R1311 Dysphagia, oral phase: Secondary | ICD-10-CM

## 2023-04-10 DIAGNOSIS — Z930 Tracheostomy status: Secondary | ICD-10-CM

## 2023-04-10 LAB — CBC
HCT: 26.9 % — ABNORMAL LOW (ref 36.0–46.0)
Hemoglobin: 8.4 g/dL — ABNORMAL LOW (ref 12.0–15.0)
MCH: 29.7 pg (ref 26.0–34.0)
MCHC: 31.2 g/dL (ref 30.0–36.0)
MCV: 95.1 fL (ref 80.0–100.0)
Platelets: 301 10*3/uL (ref 150–400)
RBC: 2.83 MIL/uL — ABNORMAL LOW (ref 3.87–5.11)
RDW: 13.9 % (ref 11.5–15.5)
WBC: 7.7 10*3/uL (ref 4.0–10.5)
nRBC: 0 % (ref 0.0–0.2)

## 2023-04-10 LAB — BASIC METABOLIC PANEL
Anion gap: 4 — ABNORMAL LOW (ref 5–15)
BUN: 19 mg/dL (ref 8–23)
CO2: 28 mmol/L (ref 22–32)
Calcium: 10 mg/dL (ref 8.9–10.3)
Chloride: 104 mmol/L (ref 98–111)
Creatinine, Ser: 0.43 mg/dL — ABNORMAL LOW (ref 0.44–1.00)
GFR, Estimated: >60 mL/min (ref >60)
Glucose, Bld: 123 mg/dL — ABNORMAL HIGH (ref 70–99)
Potassium: 4.4 mmol/L (ref 3.5–5.1)
Sodium: 136 mmol/L (ref 135–145)

## 2023-04-10 LAB — MAGNESIUM: Magnesium: 1.6 mg/dL — ABNORMAL LOW (ref 1.7–2.4)

## 2023-04-11 ENCOUNTER — Other Ambulatory Visit (HOSPITAL_COMMUNITY): Payer: Self-pay

## 2023-04-11 DIAGNOSIS — J9621 Acute and chronic respiratory failure with hypoxia: Secondary | ICD-10-CM

## 2023-04-11 DIAGNOSIS — I2694 Multiple subsegmental pulmonary emboli without acute cor pulmonale: Secondary | ICD-10-CM

## 2023-04-11 DIAGNOSIS — R1311 Dysphagia, oral phase: Secondary | ICD-10-CM

## 2023-04-11 DIAGNOSIS — Z930 Tracheostomy status: Secondary | ICD-10-CM

## 2023-04-11 LAB — MAGNESIUM: Magnesium: 1.6 mg/dL — ABNORMAL LOW (ref 1.7–2.4)

## 2023-04-12 DIAGNOSIS — Z930 Tracheostomy status: Secondary | ICD-10-CM

## 2023-04-12 DIAGNOSIS — I2694 Multiple subsegmental pulmonary emboli without acute cor pulmonale: Secondary | ICD-10-CM

## 2023-04-12 DIAGNOSIS — R1311 Dysphagia, oral phase: Secondary | ICD-10-CM

## 2023-04-12 DIAGNOSIS — J9621 Acute and chronic respiratory failure with hypoxia: Secondary | ICD-10-CM

## 2023-04-13 DIAGNOSIS — J9621 Acute and chronic respiratory failure with hypoxia: Secondary | ICD-10-CM

## 2023-04-13 DIAGNOSIS — I2694 Multiple subsegmental pulmonary emboli without acute cor pulmonale: Secondary | ICD-10-CM

## 2023-04-13 DIAGNOSIS — R1311 Dysphagia, oral phase: Secondary | ICD-10-CM

## 2023-04-13 DIAGNOSIS — Z930 Tracheostomy status: Secondary | ICD-10-CM

## 2023-04-13 LAB — MAGNESIUM: Magnesium: 1.6 mg/dL — ABNORMAL LOW (ref 1.7–2.4)

## 2023-04-14 DIAGNOSIS — J9621 Acute and chronic respiratory failure with hypoxia: Secondary | ICD-10-CM

## 2023-04-14 DIAGNOSIS — R1311 Dysphagia, oral phase: Secondary | ICD-10-CM

## 2023-04-14 DIAGNOSIS — Z930 Tracheostomy status: Secondary | ICD-10-CM

## 2023-04-14 DIAGNOSIS — I2694 Multiple subsegmental pulmonary emboli without acute cor pulmonale: Secondary | ICD-10-CM

## 2023-04-14 LAB — HEPATITIS PANEL, ACUTE
HCV Ab: NONREACTIVE
Hep A IgM: NONREACTIVE
Hep B C IgM: NONREACTIVE
Hepatitis B Surface Ag: NONREACTIVE

## 2023-04-14 LAB — COMPREHENSIVE METABOLIC PANEL
ALT: 45 U/L — ABNORMAL HIGH (ref 0–44)
AST: 32 U/L (ref 15–41)
Albumin: 2.5 g/dL — ABNORMAL LOW (ref 3.5–5.0)
Alkaline Phosphatase: 116 U/L (ref 38–126)
Anion gap: 12 (ref 5–15)
BUN: 20 mg/dL (ref 8–23)
CO2: 27 mmol/L (ref 22–32)
Calcium: 10.1 mg/dL (ref 8.9–10.3)
Chloride: 98 mmol/L (ref 98–111)
Creatinine, Ser: 0.48 mg/dL (ref 0.44–1.00)
GFR, Estimated: >60 mL/min (ref >60)
Glucose, Bld: 118 mg/dL — ABNORMAL HIGH (ref 70–99)
Potassium: 4.6 mmol/L (ref 3.5–5.1)
Sodium: 137 mmol/L (ref 135–145)
Total Bilirubin: 0.4 mg/dL (ref <1.2)
Total Protein: 6.6 g/dL (ref 6.5–8.1)

## 2023-04-14 LAB — PROTIME-INR
INR: 1.6 — ABNORMAL HIGH (ref 0.8–1.2)
Prothrombin Time: 19.2 s — ABNORMAL HIGH (ref 11.4–15.2)

## 2023-04-14 LAB — MAGNESIUM: Magnesium: 1.8 mg/dL (ref 1.7–2.4)

## 2023-04-14 LAB — GAMMA GT: GGT: 132 U/L — ABNORMAL HIGH (ref 7–50)

## 2023-04-14 NOTE — Consult Note (Signed)
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 female with HTN, Hypothyroidism, Major Depressive Disorder, cognitive impairment, arthritis, hearing loss, 30-40lb unintentional weight loss since 05/2022.   Presented to [facility] ED on 03/08/23  with AMS and hypoxia (O2 Sats in 70s-80s).  She was brought from [facility] via EMS. She was scheduled for a colonoscopy   Patient did not receive colonoscopy due to hypoxia and AMS.   CTA showed acute PE with scattered subsegmental pulmonary emboli throughout the right lung. She was started on anticoagulation.  Hospitalization was complicated by hospital-acquired pneumonia.    She failed extubation attempts at OSH and required tracheostomy placement.   Patient tolerating ATC [facility]  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 Three variants of uncertain significance were detected - one in the AXIN2 gene called c.1829G>A, one in the PMS2 gene called c.964G>A, and one in the RNF43 gene called c.1114C>T. The report date is 06/24/2019.     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 Authorizing Provider  acetaminophen (TYLENOL) 325 MG tablet Place 2 tablets (650 mg total) into feeding tube every 6 (six) hours as needed for fever or mild pain (pain score 1-3). 04/05/23   Simonne Martinet, NP  apixaban (ELIQUIS) 5 MG TABS tablet Place 1 tablet (5 mg total) into feeding tube 2 (two) times daily. 04/05/23   Simonne Martinet, NP  ascorbic acid (VITAMIN C) 500 MG tablet Place 1 tablet (500 mg total) into feeding tube 2 (two) times daily. 04/05/23   Simonne Martinet, NP  Biotin 10 MG TABS Place 1 tablet (10 mg total) into feeding tube daily. 04/06/23   Simonne Martinet, NP  carvedilol (COREG) 12.5 MG tablet Place 1 tablet (12.5 mg total) into feeding tube 2 (two) times daily with a meal.  04/05/23   Simonne Martinet, NP  clonazePAM (KLONOPIN) 0.5 MG tablet Place 1 tablet (0.5 mg total) into feeding tube 2 (two) times daily. 04/05/23   Simonne Martinet, NP  copper tablet Place 1 tablet (2 mg total) into feeding tube daily. 04/06/23   Simonne Martinet, NP  docusate (COLACE) 50 MG/5ML liquid Place 10 mLs (100 mg total) into feeding tube 2 (two) times daily. 04/05/23   Simonne Martinet, NP  donepezil (ARICEPT) 10 MG tablet Place 1 tablet (10 mg total) into feeding tube at bedtime.  04/05/23   Simonne Martinet, NP  ipratropium-albuterol (DUONEB) 0.5-2.5 (3) MG/3ML SOLN Take 3 mLs by nebulization every 6 (six) hours as needed. 04/05/23   Simonne Martinet, NP  levothyroxine (SYNTHROID) 100 MCG tablet Place 1 tablet (100 mcg total) into feeding tube daily before breakfast. 04/06/23   Simonne Martinet, NP  lip balm (CARMEX) ointment Apply topically as needed. 04/05/23   Simonne Martinet, NP  liver oil-zinc oxide (DESITIN) 40 % ointment Apply topically every 8 (eight) hours as needed for irritation. 04/05/23   Simonne Martinet, NP  Mouthwashes (MOUTH RINSE) LIQD solution 15 mLs by Mouth Rinse route every 2 (two) hours. 04/05/23   Simonne Martinet, NP  Multiple Vitamin (MULTIVITAMIN) LIQD Place 15 mLs into feeding tube daily. 04/06/23   Simonne Martinet, NP  Nutritional Supplements (FEEDING SUPPLEMENT, VITAL AF 1.2 CAL,) LIQD Place 1,000 mLs into feeding tube continuous. 04/05/23   Simonne Martinet, NP  OLANZapine (ZYPREXA) injection Inject 1 mL (5 mg total) into the muscle every 6 (six) hours as needed for agitation. 04/05/23   Simonne Martinet, NP  oxyCODONE (ROXICODONE) 5 MG/5ML solution Place 5 mLs (5 mg total) into feeding tube every 4 (four) hours as needed for severe pain (pain score 7-10) or moderate pain (pain score 4-6). 04/05/23   Simonne Martinet, NP  pantoprazole (PROTONIX) 40 MG injection Inject 40 mg into the vein at bedtime. 04/05/23   Simonne Martinet, NP  polyethylene glycol (MIRALAX / GLYCOLAX) 17 g packet Place 17 g into feeding tube daily. 04/06/23   Simonne Martinet, NP  potassium chloride (KLOR-CON) 20 MEQ packet Place 40 mEq into feeding tube 2 (two) times daily. 04/05/23   Simonne Martinet, NP  pyridOXINE (B-6) 100 MG tablet Place 1 tablet (100 mg total) into feeding tube daily. 04/06/23   Simonne Martinet, NP  QUEtiapine (SEROQUEL) 50 MG tablet Place 1 tablet (50 mg total) into feeding tube 2 (two) times daily. 04/05/23   Simonne Martinet, NP  rosuvastatin  (CRESTOR) 10 MG tablet Place 1 tablet (10 mg total) into feeding tube daily. 04/06/23   Simonne Martinet, NP  sodium chloride flush (NS) 0.9 % SOLN 10-40 mLs by Intracatheter route every 12 (twelve) hours. 04/05/23   Simonne Martinet, NP  sodium chloride flush (NS) 0.9 % SOLN 10-40 mLs by Intracatheter route as needed (flush). 04/05/23   Simonne Martinet, NP  thiamine (VITAMIN B-1) 100 MG tablet Place 1 tablet (100 mg total) into feeding tube daily. 04/06/23   Simonne Martinet, NP  venlafaxine (EFFEXOR) 37.5 MG tablet Place 1 tablet (37.5 mg total) into feeding tube 2 (two) times daily. 04/05/23   Simonne Martinet, NP     Family History  Problem Relation Age of Onset   Hypertension Mother    Pancreatic cancer Son 69   Ovarian cancer Maternal Grandmother 71   Pancreatic  cancer Half-Brother 46       non-smoker   Lung cancer Maternal Aunt 72       non-smoker   Throat cancer Maternal Aunt        dx. 48s, non-smoker   Breast cancer Cousin 18   Colon cancer Neg Hx    Esophageal cancer Neg Hx    Colon polyps Neg Hx    Rectal cancer Neg Hx    Stomach cancer Neg Hx     Social History   Socioeconomic History   Marital status: Married    Spouse name: Not on file   Number of children: 4   Years of education: 14   Highest education level: Some college, no degree  Occupational History   Occupation: Retired  Tobacco Use   Smoking status: Former    Current packs/day: 0.00    Types: Cigarettes    Start date: 04/18/1990    Quit date: 04/18/2000    Years since quitting: 23.0   Smokeless tobacco: Never   Tobacco comments:    LIGHT SMOKER did dip in 20's   Vaping Use   Vaping status: Never Used  Substance and Sexual Activity   Alcohol use: No   Drug use: Not Currently    Comment: remote college MJ use   Sexual activity: Not on file  Other Topics Concern   Not on file  Social History Narrative   Right handed   Drinks caffeine, coffee   Lives with husband and granddaughter    Two  story home   retired   Chief Executive Officer Drivers of Corporate investment banker Strain: Low Risk  (04/06/2023)   Received from [facility]   Overall Financial Resource Strain (CARDIA)    Difficulty of Paying Living Expenses: Not hard at all  Food Insecurity: No Food Insecurity (04/06/2023)   Received from [facility]   Hunger Vital Sign    Worried About Running Out of Food in the Last Year: Never true    Ran Out of Food in the Last Year: Never true  Transportation Needs: Patient Unable To Answer (04/05/2023)   Received from [facility]   SM SDOH Transportation Source    Has lack of transportation kept you from medical appointments or from getting medications?: Unable to respond    Has lack of transportation kept you from meetings, work, or from getting things needed for daily living?: Unable to respond  Physical Activity: Not on file  Stress: Patient Unable To Answer (04/05/2023)   Received from [facility]   [employer] of Occupational Health - Occupational Stress Questionnaire    Feeling of Stress : Patient unable to answer  Social Connections: Socially Integrated (04/06/2023)   Received from [facility]   Social Connection and Isolation Panel [NHANES]    Frequency of Communication with Friends and Family: More than three times a week    Frequency of Social Gatherings with Friends and Family: More than three times a week    Attends Religious Services: More than 4 times per year    Active Member of [employer] or Organizations: Yes    Attends Banker Meetings: 1 to 4 times per year    Marital Status: Married     Review of Systems  Unable to perform ROS: Patient nonverbal    Vital Signs: 144/77 98.8 Pulse 99  Physical Exam Vitals reviewed.  Constitutional:      Comments: Sleeping, unarousable  HENT:     Head: Normocephalic and atraumatic.  Eyes:     Extraocular Movements: Extraocular movements intact.  Neck:     Comments: NGT in  place Tracheostomy in place Cardiovascular:     Rate and Rhythm: Normal rate and regular rhythm.  Pulmonary:     Breath sounds: Normal breath sounds.     Comments: Tracheostomy/vent Abdominal:     General: There is no distension.     Palpations: Abdomen is soft. There is no mass.     Comments: No scars  Musculoskeletal:     Cervical back: Normal range of motion.  Skin:    General: Skin is warm and dry.     Imaging: CLINICAL DATA:  Evaluate anatomy for G-tube insertion   Respiratory failure   EXAM: CT ABDOMEN WITHOUT CONTRAST   TECHNIQUE: Multidetector CT imaging of the abdomen was performed following the standard protocol without IV contrast.   RADIATION DOSE REDUCTION: This exam was performed according to the departmental dose-optimization program which includes automated exposure control, adjustment of the mA and/or kV according to patient size and/or use of iterative reconstruction technique.   COMPARISON:  11/11/2022   FINDINGS: Lower chest: Mild bilateral dependent atelectasis.   Hepatobiliary: No focal liver abnormality is seen. Minimal cholelithiasis.   Pancreas: Unremarkable. No pancreatic ductal dilatation or surrounding inflammatory changes.   Spleen: Normal in size without focal abnormality.   Adrenals/Urinary Tract: Adrenal glands are unremarkable. Kidneys are normal, without renal calculi, focal lesion, or hydronephrosis.   Stomach/Bowel: Nasogastric tube terminates distal gastric lumen. No dilated loops of bowel identified within the visualized abdomen. Gastric anatomy is amenable to percutaneous gastrostomy placement.   Vascular/Lymphatic: No significant vascular findings are present. No enlarged abdominal lymph nodes.   Other: No abdominal wall hernia or abnormality.   Musculoskeletal: No acute or significant osseous findings.   IMPRESSION: 1. Gastric anatomy is amenable to percutaneous gastrostomy placement. 2. Minimal  cholelithiasis.     Electronically Signed   By: Acquanetta Belling M.D.   On: 04/12/2023 07:48    Labs:  CBC: Recent Labs    04/04/23 0537 04/05/23 2100 04/06/23 0334 04/10/23 0945  WBC 11.8* 11.4* 11.0* 7.7  HGB 8.1* 8.8* 8.7* 8.4*  HCT 25.9* 29.1* 28.5* 26.9*  PLT 389 361 364 301    COAGS: Recent Labs    03/08/23 1242 03/28/23 1004 04/14/23 0414  INR 1.1 1.2 1.6*  APTT 27  --   --     BMP: Recent Labs    04/05/23 2100 04/06/23 0334 04/10/23 0945 04/14/23 0414  NA 141 140 136 137  K 4.7 4.2 4.4 4.6  CL 106 105 104 98  CO2 30 28 28 27   GLUCOSE 124* 122* 123* 118*  BUN 21 21 19 20   CALCIUM 10.4* 10.5* 10.0 10.1  CREATININE 0.61 0.66 0.43* 0.48  GFRNONAA >60 >60 >60 >60    LIVER FUNCTION TESTS: Recent Labs    03/26/23 0344 03/27/23 0411 04/06/23 0334 04/14/23 0414  BILITOT 0.5 0.4 0.4 0.4  AST 25 29 94* 32  ALT 25 26 140* 45*  ALKPHOS 81 72 165* 116  PROT 6.4* 6.5 6.5 6.6  ALBUMIN 2.4* 2.4* 2.4* 2.5*    TUMOR MARKERS: No results for input(s): "AFPTM", "CEA", "CA199", "CHROMGRNA" in the last 8760 hours.  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  Spoke with daughter [name] today and she is currently undecided about the procedure.  She would like to discuss with other family members before allowing [place] to proceed.  I have  messaged Dr. Manson Passey and Leonie Man, NP via secure chat in [facility] to make them aware.  They will let us know if family is agreeable to proceed.  Thank you for allowing our service to participate in Elizebath Kalas 's care.  Electronically Signed: Gwynneth Macleod, PA-C   04/14/2023, 10:40 AM      I spent a total of 40 Minutes  in face to face in clinical consultation, greater than 50% of which was counseling/coordinating care for gtube placement.

## 2023-04-15 ENCOUNTER — Other Ambulatory Visit (HOSPITAL_COMMUNITY): Payer: Self-pay

## 2023-04-15 DIAGNOSIS — I2694 Multiple subsegmental pulmonary emboli without acute cor pulmonale: Secondary | ICD-10-CM

## 2023-04-15 DIAGNOSIS — J9621 Acute and chronic respiratory failure with hypoxia: Secondary | ICD-10-CM

## 2023-04-15 DIAGNOSIS — R1311 Dysphagia, oral phase: Secondary | ICD-10-CM

## 2023-04-15 DIAGNOSIS — Z930 Tracheostomy status: Secondary | ICD-10-CM

## 2023-04-16 DIAGNOSIS — I2694 Multiple subsegmental pulmonary emboli without acute cor pulmonale: Secondary | ICD-10-CM

## 2023-04-16 DIAGNOSIS — J9621 Acute and chronic respiratory failure with hypoxia: Secondary | ICD-10-CM

## 2023-04-16 DIAGNOSIS — R1311 Dysphagia, oral phase: Secondary | ICD-10-CM

## 2023-04-16 DIAGNOSIS — Z930 Tracheostomy status: Secondary | ICD-10-CM

## 2023-04-17 DIAGNOSIS — J9621 Acute and chronic respiratory failure with hypoxia: Secondary | ICD-10-CM

## 2023-04-17 DIAGNOSIS — Z930 Tracheostomy status: Secondary | ICD-10-CM

## 2023-04-17 DIAGNOSIS — R1311 Dysphagia, oral phase: Secondary | ICD-10-CM

## 2023-04-17 DIAGNOSIS — I2694 Multiple subsegmental pulmonary emboli without acute cor pulmonale: Secondary | ICD-10-CM

## 2023-04-17 LAB — BASIC METABOLIC PANEL
Anion gap: 8 (ref 5–15)
BUN: 17 mg/dL (ref 8–23)
CO2: 23 mmol/L (ref 22–32)
Calcium: 9.9 mg/dL (ref 8.9–10.3)
Chloride: 99 mmol/L (ref 98–111)
Creatinine, Ser: 0.46 mg/dL (ref 0.44–1.00)
GFR, Estimated: >60 mL/min (ref >60)
Glucose, Bld: 127 mg/dL — ABNORMAL HIGH (ref 70–99)
Potassium: 4.1 mmol/L (ref 3.5–5.1)
Sodium: 130 mmol/L — ABNORMAL LOW (ref 135–145)

## 2023-04-17 LAB — CBC
HCT: 25 % — ABNORMAL LOW (ref 36.0–46.0)
Hemoglobin: 8.1 g/dL — ABNORMAL LOW (ref 12.0–15.0)
MCH: 29.5 pg (ref 26.0–34.0)
MCHC: 32.4 g/dL (ref 30.0–36.0)
MCV: 90.9 fL (ref 80.0–100.0)
Platelets: 374 10*3/uL (ref 150–400)
RBC: 2.75 MIL/uL — ABNORMAL LOW (ref 3.87–5.11)
RDW: 13.6 % (ref 11.5–15.5)
WBC: 8.1 10*3/uL (ref 4.0–10.5)
nRBC: 0 % (ref 0.0–0.2)

## 2023-04-18 DIAGNOSIS — Z930 Tracheostomy status: Secondary | ICD-10-CM

## 2023-04-18 DIAGNOSIS — R1311 Dysphagia, oral phase: Secondary | ICD-10-CM

## 2023-04-18 DIAGNOSIS — I2694 Multiple subsegmental pulmonary emboli without acute cor pulmonale: Secondary | ICD-10-CM

## 2023-04-18 DIAGNOSIS — J9621 Acute and chronic respiratory failure with hypoxia: Secondary | ICD-10-CM

## 2023-04-19 ENCOUNTER — Institutional Professional Consult (permissible substitution) (HOSPITAL_COMMUNITY): Payer: Self-pay

## 2023-04-19 DIAGNOSIS — J9621 Acute and chronic respiratory failure with hypoxia: Secondary | ICD-10-CM

## 2023-04-19 DIAGNOSIS — Z930 Tracheostomy status: Secondary | ICD-10-CM

## 2023-04-19 DIAGNOSIS — R1311 Dysphagia, oral phase: Secondary | ICD-10-CM

## 2023-04-19 DIAGNOSIS — I2694 Multiple subsegmental pulmonary emboli without acute cor pulmonale: Secondary | ICD-10-CM

## 2023-04-19 LAB — URINALYSIS, ROUTINE W REFLEX MICROSCOPIC
Bacteria, UA: NONE SEEN
Bilirubin Urine: NEGATIVE
Glucose, UA: NEGATIVE mg/dL
Hgb urine dipstick: NEGATIVE
Ketones, ur: NEGATIVE mg/dL
Nitrite: NEGATIVE
Protein, ur: NEGATIVE mg/dL
Specific Gravity, Urine: 1.012 (ref 1.005–1.030)
pH: 8 (ref 5.0–8.0)

## 2023-04-20 ENCOUNTER — Institutional Professional Consult (permissible substitution) (HOSPITAL_COMMUNITY): Payer: Self-pay

## 2023-04-20 DIAGNOSIS — J9621 Acute and chronic respiratory failure with hypoxia: Secondary | ICD-10-CM

## 2023-04-20 DIAGNOSIS — Z930 Tracheostomy status: Secondary | ICD-10-CM

## 2023-04-20 DIAGNOSIS — I2694 Multiple subsegmental pulmonary emboli without acute cor pulmonale: Secondary | ICD-10-CM

## 2023-04-20 DIAGNOSIS — R1311 Dysphagia, oral phase: Secondary | ICD-10-CM

## 2023-04-21 DIAGNOSIS — R1311 Dysphagia, oral phase: Secondary | ICD-10-CM

## 2023-04-21 DIAGNOSIS — I2694 Multiple subsegmental pulmonary emboli without acute cor pulmonale: Secondary | ICD-10-CM

## 2023-04-21 DIAGNOSIS — J9621 Acute and chronic respiratory failure with hypoxia: Secondary | ICD-10-CM

## 2023-04-21 DIAGNOSIS — Z930 Tracheostomy status: Secondary | ICD-10-CM

## 2023-04-21 LAB — URINE CULTURE: Culture: 80000 — AB

## 2023-04-21 NOTE — Plan of Care (Signed)
 IR was consulted for G tube placement, patient's family has been discussing.  IR was informed that family wishes to proceed with the G tube now.  Daughter Romi Rathel and spouse Eleaner Dibartolo were called, risks and benefit of G tube were discussed in detail and informed consent was obtained, in IR.  Patient was formally seen on 04/14/23.   Patient currently afebrile, CBC on 12/30 with no leukocytosis. UA on 04/19/23 showing Klebsiella pneumoniae, on cefepime.  CT 04/19/23 showed concern for pneumonia and atelectasis, on cefepime empirically and Mucomyst.   Asked [facility] team to start holding Eliquis  on Saturday, requires 2 day hold.   The procedure is tentatively scheduled for Monday 1/6. - Will require finally approval from the performing radiologist due to recent diagnosis of UTI/ PNA.   PLAN - TF to be held Monday MN, Eliquis  to be held starting Sat [facility] team notified, order placed in [facility] Epic as well.  - Repeat CBC with diff on Monday - Will discuss during Monday huddle to see if we can proceed or need to wait till infection clears   Please call IR for questions and concerns.   Pharaoh Pio H Eljay Lave PA-C 04/21/2023 11:54 AM

## 2023-04-22 DIAGNOSIS — I2694 Multiple subsegmental pulmonary emboli without acute cor pulmonale: Secondary | ICD-10-CM

## 2023-04-22 DIAGNOSIS — Z930 Tracheostomy status: Secondary | ICD-10-CM

## 2023-04-22 DIAGNOSIS — R1311 Dysphagia, oral phase: Secondary | ICD-10-CM

## 2023-04-22 DIAGNOSIS — J9621 Acute and chronic respiratory failure with hypoxia: Secondary | ICD-10-CM

## 2023-04-22 LAB — CULTURE, RESPIRATORY W GRAM STAIN

## 2023-04-23 DIAGNOSIS — Z930 Tracheostomy status: Secondary | ICD-10-CM

## 2023-04-23 DIAGNOSIS — I2694 Multiple subsegmental pulmonary emboli without acute cor pulmonale: Secondary | ICD-10-CM

## 2023-04-23 DIAGNOSIS — R1311 Dysphagia, oral phase: Secondary | ICD-10-CM

## 2023-04-23 DIAGNOSIS — J9621 Acute and chronic respiratory failure with hypoxia: Secondary | ICD-10-CM

## 2023-04-24 ENCOUNTER — Other Ambulatory Visit (HOSPITAL_COMMUNITY): Payer: Self-pay

## 2023-04-24 HISTORY — PX: IR GASTROSTOMY TUBE MOD SED: IMG625

## 2023-04-24 LAB — BASIC METABOLIC PANEL
Anion gap: 11 (ref 5–15)
BUN: 24 mg/dL — ABNORMAL HIGH (ref 8–23)
CO2: 23 mmol/L (ref 22–32)
Calcium: 10.4 mg/dL — ABNORMAL HIGH (ref 8.9–10.3)
Chloride: 102 mmol/L (ref 98–111)
Creatinine, Ser: 0.48 mg/dL (ref 0.44–1.00)
GFR, Estimated: >60 mL/min (ref >60)
Glucose, Bld: 116 mg/dL — ABNORMAL HIGH (ref 70–99)
Potassium: 4 mmol/L (ref 3.5–5.1)
Sodium: 136 mmol/L (ref 135–145)

## 2023-04-24 LAB — CBC WITH DIFFERENTIAL/PLATELET
Abs Immature Granulocytes: 0.39 10*3/uL — ABNORMAL HIGH (ref 0.00–0.07)
Basophils Absolute: 0.1 10*3/uL (ref 0.0–0.1)
Basophils Relative: 1 %
Eosinophils Absolute: 0.1 10*3/uL (ref 0.0–0.5)
Eosinophils Relative: 1 %
HCT: 27 % — ABNORMAL LOW (ref 36.0–46.0)
Hemoglobin: 8.6 g/dL — ABNORMAL LOW (ref 12.0–15.0)
Immature Granulocytes: 4 %
Lymphocytes Relative: 21 %
Lymphs Abs: 1.9 10*3/uL (ref 0.7–4.0)
MCH: 28.9 pg (ref 26.0–34.0)
MCHC: 31.9 g/dL (ref 30.0–36.0)
MCV: 90.6 fL (ref 80.0–100.0)
Monocytes Absolute: 0.7 10*3/uL (ref 0.1–1.0)
Monocytes Relative: 7 %
Neutro Abs: 5.9 10*3/uL (ref 1.7–7.7)
Neutrophils Relative %: 66 %
Platelets: 567 10*3/uL — ABNORMAL HIGH (ref 150–400)
RBC: 2.98 MIL/uL — ABNORMAL LOW (ref 3.87–5.11)
RDW: 13.8 % (ref 11.5–15.5)
WBC: 9 10*3/uL (ref 4.0–10.5)
nRBC: 0 % (ref 0.0–0.2)

## 2023-04-24 LAB — CBC
HCT: 27.8 % — ABNORMAL LOW (ref 36.0–46.0)
Hemoglobin: 8.7 g/dL — ABNORMAL LOW (ref 12.0–15.0)
MCH: 28.6 pg (ref 26.0–34.0)
MCHC: 31.3 g/dL (ref 30.0–36.0)
MCV: 91.4 fL (ref 80.0–100.0)
Platelets: 594 10*3/uL — ABNORMAL HIGH (ref 150–400)
RBC: 3.04 MIL/uL — ABNORMAL LOW (ref 3.87–5.11)
RDW: 14 % (ref 11.5–15.5)
WBC: 9.4 10*3/uL (ref 4.0–10.5)
nRBC: 0 % (ref 0.0–0.2)

## 2023-04-24 LAB — PROTIME-INR
INR: 1.3 — ABNORMAL HIGH (ref 0.8–1.2)
Prothrombin Time: 16.5 s — ABNORMAL HIGH (ref 11.4–15.2)

## 2023-04-24 MED ORDER — IOHEXOL 300 MG/ML  SOLN
50.0000 mL | Freq: Once | INTRAMUSCULAR | Status: AC | PRN
  Administered 2023-04-24: 15 mL

## 2023-04-24 MED ORDER — FENTANYL CITRATE (PF) 100 MCG/2ML IJ SOLN
INTRAMUSCULAR | Status: AC | PRN
  Administered 2023-04-24: 25 ug via INTRAVENOUS
  Administered 2023-04-24: 50 ug via INTRAVENOUS

## 2023-04-24 MED ORDER — GLUCAGON HCL (RDNA) 1 MG IJ SOLR
INTRAMUSCULAR | Status: AC | PRN
  Administered 2023-04-24: 1 mg via INTRAVENOUS

## 2023-04-24 MED ORDER — LIDOCAINE HCL 1 % IJ SOLN
20.0000 mL | Freq: Once | INTRAMUSCULAR | Status: AC
  Administered 2023-04-24: 10 mL via INTRADERMAL

## 2023-04-24 MED ORDER — CEFAZOLIN SODIUM-DEXTROSE 2-4 GM/100ML-% IV SOLN
INTRAVENOUS | Status: AC | PRN
  Administered 2023-04-24: 2 g via INTRAVENOUS

## 2023-04-24 MED ORDER — MIDAZOLAM HCL 2 MG/2ML IJ SOLN
INTRAMUSCULAR | Status: AC | PRN
  Administered 2023-04-24: 1 mg via INTRAVENOUS
  Administered 2023-04-24 (×2): .5 mg via INTRAVENOUS

## 2023-04-24 NOTE — Procedures (Signed)
 Interventional Radiology Procedure Note  Procedure: Placement of percutaneous 47F pull-through gastrostomy tube.  Complications: None  Recommendations: - Maintain G-tube to LWS x 3 hrs - May begin using tube tomorrow morning  Signed,  Wilkie LOIS Lent, MD

## 2023-04-25 DIAGNOSIS — Z930 Tracheostomy status: Secondary | ICD-10-CM

## 2023-04-25 DIAGNOSIS — J9621 Acute and chronic respiratory failure with hypoxia: Secondary | ICD-10-CM

## 2023-04-25 DIAGNOSIS — I2694 Multiple subsegmental pulmonary emboli without acute cor pulmonale: Secondary | ICD-10-CM

## 2023-04-25 DIAGNOSIS — R1311 Dysphagia, oral phase: Secondary | ICD-10-CM

## 2023-04-25 NOTE — Progress Notes (Signed)
 Karen [name] is a 73 y.o. female s/p image guided gastrostomy tube placement with IR yesterday.   G tube site appears clean, dry, no bleeding, no sign of acute infection. Tube is being used w/o difficulty.   Please call IR for questions and concerns regarding the G tube.    Devaun Hernandez H Jermany Sundell PA-C 04/25/2023 11:34 AM

## 2023-04-26 DIAGNOSIS — J9621 Acute and chronic respiratory failure with hypoxia: Secondary | ICD-10-CM

## 2023-04-26 DIAGNOSIS — I2694 Multiple subsegmental pulmonary emboli without acute cor pulmonale: Secondary | ICD-10-CM

## 2023-04-26 DIAGNOSIS — R1311 Dysphagia, oral phase: Secondary | ICD-10-CM

## 2023-04-26 DIAGNOSIS — Z930 Tracheostomy status: Secondary | ICD-10-CM

## 2023-04-27 DIAGNOSIS — Z930 Tracheostomy status: Secondary | ICD-10-CM

## 2023-04-27 DIAGNOSIS — R1311 Dysphagia, oral phase: Secondary | ICD-10-CM

## 2023-04-27 DIAGNOSIS — I2694 Multiple subsegmental pulmonary emboli without acute cor pulmonale: Secondary | ICD-10-CM

## 2023-04-27 DIAGNOSIS — J9621 Acute and chronic respiratory failure with hypoxia: Secondary | ICD-10-CM

## 2023-04-27 LAB — BASIC METABOLIC PANEL
Anion gap: 7 (ref 5–15)
BUN: 16 mg/dL (ref 8–23)
CO2: 26 mmol/L (ref 22–32)
Calcium: 10.4 mg/dL — ABNORMAL HIGH (ref 8.9–10.3)
Chloride: 102 mmol/L (ref 98–111)
Creatinine, Ser: 0.43 mg/dL — ABNORMAL LOW (ref 0.44–1.00)
GFR, Estimated: >60 mL/min (ref >60)
Glucose, Bld: 86 mg/dL (ref 70–99)
Potassium: 4 mmol/L (ref 3.5–5.1)
Sodium: 135 mmol/L (ref 135–145)

## 2023-04-27 LAB — CBC
HCT: 27.8 % — ABNORMAL LOW (ref 36.0–46.0)
Hemoglobin: 8.7 g/dL — ABNORMAL LOW (ref 12.0–15.0)
MCH: 28.4 pg (ref 26.0–34.0)
MCHC: 31.3 g/dL (ref 30.0–36.0)
MCV: 90.8 fL (ref 80.0–100.0)
Platelets: 519 10*3/uL — ABNORMAL HIGH (ref 150–400)
RBC: 3.06 MIL/uL — ABNORMAL LOW (ref 3.87–5.11)
RDW: 14.1 % (ref 11.5–15.5)
WBC: 8.3 10*3/uL (ref 4.0–10.5)
nRBC: 0 % (ref 0.0–0.2)

## 2023-04-27 LAB — MAGNESIUM: Magnesium: 1.4 mg/dL — ABNORMAL LOW (ref 1.7–2.4)

## 2023-04-28 DIAGNOSIS — R1311 Dysphagia, oral phase: Secondary | ICD-10-CM

## 2023-04-28 DIAGNOSIS — J9621 Acute and chronic respiratory failure with hypoxia: Secondary | ICD-10-CM

## 2023-04-28 DIAGNOSIS — Z930 Tracheostomy status: Secondary | ICD-10-CM

## 2023-04-28 DIAGNOSIS — I2694 Multiple subsegmental pulmonary emboli without acute cor pulmonale: Secondary | ICD-10-CM

## 2023-04-28 LAB — MAGNESIUM: Magnesium: 1.9 mg/dL (ref 1.7–2.4)

## 2023-04-29 DIAGNOSIS — Z930 Tracheostomy status: Secondary | ICD-10-CM

## 2023-04-29 DIAGNOSIS — J9621 Acute and chronic respiratory failure with hypoxia: Secondary | ICD-10-CM

## 2023-04-29 DIAGNOSIS — R1311 Dysphagia, oral phase: Secondary | ICD-10-CM

## 2023-04-29 DIAGNOSIS — I2694 Multiple subsegmental pulmonary emboli without acute cor pulmonale: Secondary | ICD-10-CM

## 2023-04-30 DIAGNOSIS — R1311 Dysphagia, oral phase: Secondary | ICD-10-CM

## 2023-04-30 DIAGNOSIS — J9621 Acute and chronic respiratory failure with hypoxia: Secondary | ICD-10-CM

## 2023-04-30 DIAGNOSIS — I2694 Multiple subsegmental pulmonary emboli without acute cor pulmonale: Secondary | ICD-10-CM

## 2023-04-30 DIAGNOSIS — Z930 Tracheostomy status: Secondary | ICD-10-CM

## 2023-05-01 DIAGNOSIS — Z930 Tracheostomy status: Secondary | ICD-10-CM

## 2023-05-01 DIAGNOSIS — R1311 Dysphagia, oral phase: Secondary | ICD-10-CM

## 2023-05-01 DIAGNOSIS — J9621 Acute and chronic respiratory failure with hypoxia: Secondary | ICD-10-CM

## 2023-05-01 DIAGNOSIS — I2694 Multiple subsegmental pulmonary emboli without acute cor pulmonale: Secondary | ICD-10-CM

## 2023-05-01 LAB — COMPREHENSIVE METABOLIC PANEL
ALT: 24 U/L (ref 0–44)
AST: 24 U/L (ref 15–41)
Albumin: 2.5 g/dL — ABNORMAL LOW (ref 3.5–5.0)
Alkaline Phosphatase: 76 U/L (ref 38–126)
Anion gap: 7 (ref 5–15)
BUN: 25 mg/dL — ABNORMAL HIGH (ref 8–23)
CO2: 27 mmol/L (ref 22–32)
Calcium: 10 mg/dL (ref 8.9–10.3)
Chloride: 103 mmol/L (ref 98–111)
Creatinine, Ser: 0.5 mg/dL (ref 0.44–1.00)
GFR, Estimated: >60 mL/min (ref >60)
Glucose, Bld: 112 mg/dL — ABNORMAL HIGH (ref 70–99)
Potassium: 4.3 mmol/L (ref 3.5–5.1)
Sodium: 137 mmol/L (ref 135–145)
Total Bilirubin: 0.4 mg/dL (ref 0.0–1.2)
Total Protein: 6.7 g/dL (ref 6.5–8.1)

## 2023-05-01 LAB — CBC WITH DIFFERENTIAL/PLATELET
Abs Immature Granulocytes: 0.1 10*3/uL — ABNORMAL HIGH (ref 0.00–0.07)
Basophils Absolute: 0.1 10*3/uL (ref 0.0–0.1)
Basophils Relative: 1 %
Eosinophils Absolute: 0 10*3/uL (ref 0.0–0.5)
Eosinophils Relative: 0 %
HCT: 26.6 % — ABNORMAL LOW (ref 36.0–46.0)
Hemoglobin: 8.3 g/dL — ABNORMAL LOW (ref 12.0–15.0)
Immature Granulocytes: 1 %
Lymphocytes Relative: 17 %
Lymphs Abs: 1.4 10*3/uL (ref 0.7–4.0)
MCH: 28.1 pg (ref 26.0–34.0)
MCHC: 31.2 g/dL (ref 30.0–36.0)
MCV: 90.2 fL (ref 80.0–100.0)
Monocytes Absolute: 0.5 10*3/uL (ref 0.1–1.0)
Monocytes Relative: 6 %
Neutro Abs: 6.3 10*3/uL (ref 1.7–7.7)
Neutrophils Relative %: 75 %
Platelets: 508 10*3/uL — ABNORMAL HIGH (ref 150–400)
RBC: 2.95 MIL/uL — ABNORMAL LOW (ref 3.87–5.11)
RDW: 14.3 % (ref 11.5–15.5)
WBC: 8.4 10*3/uL (ref 4.0–10.5)
nRBC: 0 % (ref 0.0–0.2)

## 2023-05-02 DIAGNOSIS — Z930 Tracheostomy status: Secondary | ICD-10-CM

## 2023-05-02 DIAGNOSIS — J9621 Acute and chronic respiratory failure with hypoxia: Secondary | ICD-10-CM

## 2023-05-02 DIAGNOSIS — I2694 Multiple subsegmental pulmonary emboli without acute cor pulmonale: Secondary | ICD-10-CM

## 2023-05-02 DIAGNOSIS — R1311 Dysphagia, oral phase: Secondary | ICD-10-CM

## 2023-05-03 DIAGNOSIS — R1311 Dysphagia, oral phase: Secondary | ICD-10-CM

## 2023-05-03 DIAGNOSIS — Z930 Tracheostomy status: Secondary | ICD-10-CM

## 2023-05-03 DIAGNOSIS — I2694 Multiple subsegmental pulmonary emboli without acute cor pulmonale: Secondary | ICD-10-CM

## 2023-05-03 DIAGNOSIS — J9621 Acute and chronic respiratory failure with hypoxia: Secondary | ICD-10-CM

## 2023-05-04 DIAGNOSIS — Z930 Tracheostomy status: Secondary | ICD-10-CM

## 2023-05-04 DIAGNOSIS — J9621 Acute and chronic respiratory failure with hypoxia: Secondary | ICD-10-CM

## 2023-05-04 DIAGNOSIS — I2694 Multiple subsegmental pulmonary emboli without acute cor pulmonale: Secondary | ICD-10-CM

## 2023-05-04 DIAGNOSIS — R1311 Dysphagia, oral phase: Secondary | ICD-10-CM

## 2023-05-04 LAB — CBC
HCT: 29.4 % — ABNORMAL LOW (ref 36.0–46.0)
Hemoglobin: 9.1 g/dL — ABNORMAL LOW (ref 12.0–15.0)
MCH: 28.4 pg (ref 26.0–34.0)
MCHC: 31 g/dL (ref 30.0–36.0)
MCV: 91.9 fL (ref 80.0–100.0)
Platelets: 410 10*3/uL — ABNORMAL HIGH (ref 150–400)
RBC: 3.2 MIL/uL — ABNORMAL LOW (ref 3.87–5.11)
RDW: 14.6 % (ref 11.5–15.5)
WBC: 9.5 10*3/uL (ref 4.0–10.5)
nRBC: 0 % (ref 0.0–0.2)

## 2023-05-04 LAB — COMPREHENSIVE METABOLIC PANEL
ALT: 31 U/L (ref 0–44)
AST: 29 U/L (ref 15–41)
Albumin: 2.6 g/dL — ABNORMAL LOW (ref 3.5–5.0)
Alkaline Phosphatase: 80 U/L (ref 38–126)
Anion gap: 8 (ref 5–15)
BUN: 23 mg/dL (ref 8–23)
CO2: 28 mmol/L (ref 22–32)
Calcium: 10.2 mg/dL (ref 8.9–10.3)
Chloride: 103 mmol/L (ref 98–111)
Creatinine, Ser: 0.4 mg/dL — ABNORMAL LOW (ref 0.44–1.00)
GFR, Estimated: >60 mL/min (ref >60)
Glucose, Bld: 111 mg/dL — ABNORMAL HIGH (ref 70–99)
Potassium: 4.2 mmol/L (ref 3.5–5.1)
Sodium: 139 mmol/L (ref 135–145)
Total Bilirubin: 0.3 mg/dL (ref 0.0–1.2)
Total Protein: 7.1 g/dL (ref 6.5–8.1)

## 2023-05-04 LAB — MAGNESIUM: Magnesium: 1.4 mg/dL — ABNORMAL LOW (ref 1.7–2.4)

## 2023-05-05 DIAGNOSIS — J9621 Acute and chronic respiratory failure with hypoxia: Secondary | ICD-10-CM

## 2023-05-05 DIAGNOSIS — Z930 Tracheostomy status: Secondary | ICD-10-CM

## 2023-05-05 DIAGNOSIS — R1311 Dysphagia, oral phase: Secondary | ICD-10-CM

## 2023-05-05 DIAGNOSIS — I2694 Multiple subsegmental pulmonary emboli without acute cor pulmonale: Secondary | ICD-10-CM

## 2023-05-06 DIAGNOSIS — Z930 Tracheostomy status: Secondary | ICD-10-CM

## 2023-05-06 DIAGNOSIS — R1311 Dysphagia, oral phase: Secondary | ICD-10-CM

## 2023-05-06 DIAGNOSIS — I2694 Multiple subsegmental pulmonary emboli without acute cor pulmonale: Secondary | ICD-10-CM

## 2023-05-06 DIAGNOSIS — J9621 Acute and chronic respiratory failure with hypoxia: Secondary | ICD-10-CM

## 2023-05-06 LAB — MAGNESIUM: Magnesium: 1.5 mg/dL — ABNORMAL LOW (ref 1.7–2.4)

## 2023-05-07 DIAGNOSIS — R1311 Dysphagia, oral phase: Secondary | ICD-10-CM

## 2023-05-07 DIAGNOSIS — I2694 Multiple subsegmental pulmonary emboli without acute cor pulmonale: Secondary | ICD-10-CM

## 2023-05-07 DIAGNOSIS — Z930 Tracheostomy status: Secondary | ICD-10-CM

## 2023-05-07 DIAGNOSIS — J9621 Acute and chronic respiratory failure with hypoxia: Secondary | ICD-10-CM

## 2023-05-07 LAB — MAGNESIUM: Magnesium: 1.7 mg/dL (ref 1.7–2.4)

## 2023-05-08 DIAGNOSIS — J9621 Acute and chronic respiratory failure with hypoxia: Secondary | ICD-10-CM

## 2023-05-08 DIAGNOSIS — Z930 Tracheostomy status: Secondary | ICD-10-CM

## 2023-05-08 DIAGNOSIS — R1311 Dysphagia, oral phase: Secondary | ICD-10-CM

## 2023-05-08 DIAGNOSIS — I2694 Multiple subsegmental pulmonary emboli without acute cor pulmonale: Secondary | ICD-10-CM

## 2023-05-08 LAB — BLOOD GAS, ARTERIAL
Acid-Base Excess: 9.7 mmol/L — ABNORMAL HIGH (ref 0.0–2.0)
Bicarbonate: 36.3 mmol/L — ABNORMAL HIGH (ref 20.0–28.0)
O2 Saturation: 100 %
Patient temperature: 37
pCO2 arterial: 56 mm[Hg] — ABNORMAL HIGH (ref 32–48)
pH, Arterial: 7.42 (ref 7.35–7.45)
pO2, Arterial: 100 mm[Hg] (ref 83–108)

## 2023-05-08 LAB — BASIC METABOLIC PANEL
Anion gap: 7 (ref 5–15)
BUN: 20 mg/dL (ref 8–23)
CO2: 32 mmol/L (ref 22–32)
Calcium: 10.3 mg/dL (ref 8.9–10.3)
Chloride: 102 mmol/L (ref 98–111)
Creatinine, Ser: 0.37 mg/dL — ABNORMAL LOW (ref 0.44–1.00)
GFR, Estimated: >60 mL/min (ref >60)
Glucose, Bld: 126 mg/dL — ABNORMAL HIGH (ref 70–99)
Potassium: 4 mmol/L (ref 3.5–5.1)
Sodium: 141 mmol/L (ref 135–145)

## 2023-05-08 LAB — CBC
HCT: 29.5 % — ABNORMAL LOW (ref 36.0–46.0)
Hemoglobin: 9 g/dL — ABNORMAL LOW (ref 12.0–15.0)
MCH: 28.3 pg (ref 26.0–34.0)
MCHC: 30.5 g/dL (ref 30.0–36.0)
MCV: 92.8 fL (ref 80.0–100.0)
Platelets: 345 10*3/uL (ref 150–400)
RBC: 3.18 MIL/uL — ABNORMAL LOW (ref 3.87–5.11)
RDW: 14.4 % (ref 11.5–15.5)
WBC: 6.6 10*3/uL (ref 4.0–10.5)
nRBC: 0 % (ref 0.0–0.2)

## 2023-05-09 ENCOUNTER — Other Ambulatory Visit (HOSPITAL_COMMUNITY): Payer: Self-pay

## 2023-05-09 DIAGNOSIS — I2694 Multiple subsegmental pulmonary emboli without acute cor pulmonale: Secondary | ICD-10-CM

## 2023-05-09 DIAGNOSIS — J9621 Acute and chronic respiratory failure with hypoxia: Secondary | ICD-10-CM

## 2023-05-09 DIAGNOSIS — R1311 Dysphagia, oral phase: Secondary | ICD-10-CM

## 2023-05-09 DIAGNOSIS — Z930 Tracheostomy status: Secondary | ICD-10-CM

## 2023-05-09 LAB — BLOOD GAS, ARTERIAL
Acid-Base Excess: 14.2 mmol/L — ABNORMAL HIGH (ref 0.0–2.0)
Bicarbonate: 44.3 mmol/L — ABNORMAL HIGH (ref 20.0–28.0)
O2 Saturation: 100 %
Patient temperature: 36.1
pCO2 arterial: 81 mm[Hg] (ref 32–48)
pH, Arterial: 7.34 — ABNORMAL LOW (ref 7.35–7.45)
pO2, Arterial: 145 mm[Hg] — ABNORMAL HIGH (ref 83–108)

## 2023-05-09 LAB — MAGNESIUM: Magnesium: 1.6 mg/dL — ABNORMAL LOW (ref 1.7–2.4)

## 2023-05-10 DIAGNOSIS — R1311 Dysphagia, oral phase: Secondary | ICD-10-CM

## 2023-05-10 DIAGNOSIS — J9621 Acute and chronic respiratory failure with hypoxia: Secondary | ICD-10-CM

## 2023-05-10 DIAGNOSIS — I2694 Multiple subsegmental pulmonary emboli without acute cor pulmonale: Secondary | ICD-10-CM

## 2023-05-10 DIAGNOSIS — Z930 Tracheostomy status: Secondary | ICD-10-CM

## 2023-05-10 LAB — MAGNESIUM: Magnesium: 1.6 mg/dL — ABNORMAL LOW (ref 1.7–2.4)

## 2023-05-11 ENCOUNTER — Encounter: Payer: Self-pay | Admitting: Internal Medicine

## 2023-05-11 ENCOUNTER — Other Ambulatory Visit (HOSPITAL_COMMUNITY): Payer: Self-pay

## 2023-05-11 DIAGNOSIS — J9621 Acute and chronic respiratory failure with hypoxia: Secondary | ICD-10-CM

## 2023-05-11 DIAGNOSIS — Z930 Tracheostomy status: Secondary | ICD-10-CM

## 2023-05-11 DIAGNOSIS — R1311 Dysphagia, oral phase: Secondary | ICD-10-CM

## 2023-05-11 DIAGNOSIS — I2694 Multiple subsegmental pulmonary emboli without acute cor pulmonale: Secondary | ICD-10-CM

## 2023-05-11 LAB — ECHOCARDIOGRAM COMPLETE BUBBLE STUDY
AR max vel: 2.22 cm2
AV Area VTI: 2.02 cm2
AV Area mean vel: 2.15 cm2
AV Mean grad: 3 mm[Hg]
AV Peak grad: 5.5 mm[Hg]
Ao pk vel: 1.17 m/s
Area-P 1/2: 3.91 cm2
S' Lateral: 2.3 cm

## 2023-05-11 LAB — MAGNESIUM: Magnesium: 1.9 mg/dL (ref 1.7–2.4)

## 2023-05-11 MED ORDER — IOHEXOL 350 MG/ML SOLN
75.0000 mL | Freq: Once | INTRAVENOUS | Status: AC | PRN
  Administered 2023-05-11: 75 mL via INTRAVENOUS

## 2023-05-12 ENCOUNTER — Institutional Professional Consult (permissible substitution): Payer: Medicare HMO | Admitting: Psychology

## 2023-05-12 ENCOUNTER — Ambulatory Visit: Payer: Self-pay

## 2023-05-12 DIAGNOSIS — J9621 Acute and chronic respiratory failure with hypoxia: Secondary | ICD-10-CM

## 2023-05-12 DIAGNOSIS — I2694 Multiple subsegmental pulmonary emboli without acute cor pulmonale: Secondary | ICD-10-CM

## 2023-05-12 DIAGNOSIS — R1311 Dysphagia, oral phase: Secondary | ICD-10-CM

## 2023-05-12 DIAGNOSIS — Z930 Tracheostomy status: Secondary | ICD-10-CM

## 2023-05-13 DIAGNOSIS — I2694 Multiple subsegmental pulmonary emboli without acute cor pulmonale: Secondary | ICD-10-CM

## 2023-05-13 DIAGNOSIS — J9621 Acute and chronic respiratory failure with hypoxia: Secondary | ICD-10-CM

## 2023-05-13 DIAGNOSIS — Z930 Tracheostomy status: Secondary | ICD-10-CM

## 2023-05-13 DIAGNOSIS — R1311 Dysphagia, oral phase: Secondary | ICD-10-CM

## 2023-05-14 DIAGNOSIS — I2694 Multiple subsegmental pulmonary emboli without acute cor pulmonale: Secondary | ICD-10-CM

## 2023-05-14 DIAGNOSIS — Z930 Tracheostomy status: Secondary | ICD-10-CM

## 2023-05-14 DIAGNOSIS — J9621 Acute and chronic respiratory failure with hypoxia: Secondary | ICD-10-CM

## 2023-05-14 DIAGNOSIS — R1311 Dysphagia, oral phase: Secondary | ICD-10-CM

## 2023-05-14 LAB — CULTURE, RESPIRATORY W GRAM STAIN

## 2023-05-15 DIAGNOSIS — R1310 Dysphagia, unspecified: Secondary | ICD-10-CM | POA: Diagnosis not present

## 2023-05-15 DIAGNOSIS — F03911 Unspecified dementia, unspecified severity, with agitation: Secondary | ICD-10-CM | POA: Diagnosis not present

## 2023-05-15 DIAGNOSIS — I10 Essential (primary) hypertension: Secondary | ICD-10-CM | POA: Diagnosis not present

## 2023-05-15 DIAGNOSIS — J9610 Chronic respiratory failure, unspecified whether with hypoxia or hypercapnia: Secondary | ICD-10-CM | POA: Diagnosis not present

## 2023-05-15 DIAGNOSIS — Z931 Gastrostomy status: Secondary | ICD-10-CM | POA: Diagnosis not present

## 2023-05-15 DIAGNOSIS — R000 Tachycardia, unspecified: Secondary | ICD-10-CM | POA: Diagnosis not present

## 2023-05-15 DIAGNOSIS — Z930 Tracheostomy status: Secondary | ICD-10-CM | POA: Diagnosis not present

## 2023-05-15 DIAGNOSIS — J15211 Pneumonia due to Methicillin susceptible Staphylococcus aureus: Secondary | ICD-10-CM | POA: Diagnosis not present

## 2023-05-15 DIAGNOSIS — E039 Hypothyroidism, unspecified: Secondary | ICD-10-CM | POA: Diagnosis not present

## 2023-05-15 DIAGNOSIS — Z743 Need for continuous supervision: Secondary | ICD-10-CM | POA: Diagnosis not present

## 2023-05-15 DIAGNOSIS — F0392 Unspecified dementia, unspecified severity, with psychotic disturbance: Secondary | ICD-10-CM | POA: Diagnosis not present

## 2023-05-15 DIAGNOSIS — J9600 Acute respiratory failure, unspecified whether with hypoxia or hypercapnia: Secondary | ICD-10-CM | POA: Diagnosis not present

## 2023-05-15 DIAGNOSIS — Z8701 Personal history of pneumonia (recurrent): Secondary | ICD-10-CM | POA: Diagnosis not present

## 2023-05-15 DIAGNOSIS — Z431 Encounter for attention to gastrostomy: Secondary | ICD-10-CM | POA: Diagnosis not present

## 2023-05-15 DIAGNOSIS — R0689 Other abnormalities of breathing: Secondary | ICD-10-CM | POA: Diagnosis not present

## 2023-05-15 DIAGNOSIS — J9601 Acute respiratory failure with hypoxia: Secondary | ICD-10-CM | POA: Diagnosis not present

## 2023-05-15 DIAGNOSIS — J95851 Ventilator associated pneumonia: Secondary | ICD-10-CM | POA: Diagnosis not present

## 2023-05-15 DIAGNOSIS — J151 Pneumonia due to Pseudomonas: Secondary | ICD-10-CM | POA: Diagnosis not present

## 2023-05-15 DIAGNOSIS — G9340 Encephalopathy, unspecified: Secondary | ICD-10-CM | POA: Diagnosis not present

## 2023-05-15 DIAGNOSIS — Z430 Encounter for attention to tracheostomy: Secondary | ICD-10-CM | POA: Diagnosis not present

## 2023-05-15 DIAGNOSIS — F32A Depression, unspecified: Secondary | ICD-10-CM | POA: Diagnosis not present

## 2023-05-15 DIAGNOSIS — Z9911 Dependence on respirator [ventilator] status: Secondary | ICD-10-CM | POA: Diagnosis not present

## 2023-05-15 DIAGNOSIS — R451 Restlessness and agitation: Secondary | ICD-10-CM | POA: Diagnosis not present

## 2023-05-15 DIAGNOSIS — J9611 Chronic respiratory failure with hypoxia: Secondary | ICD-10-CM | POA: Diagnosis not present

## 2023-05-15 DIAGNOSIS — R404 Transient alteration of awareness: Secondary | ICD-10-CM | POA: Diagnosis not present

## 2023-05-15 DIAGNOSIS — I2694 Multiple subsegmental pulmonary emboli without acute cor pulmonale: Secondary | ICD-10-CM | POA: Diagnosis not present

## 2023-05-15 DIAGNOSIS — I2693 Single subsegmental pulmonary embolism without acute cor pulmonale: Secondary | ICD-10-CM | POA: Diagnosis not present

## 2023-05-15 DIAGNOSIS — F0390 Unspecified dementia without behavioral disturbance: Secondary | ICD-10-CM | POA: Diagnosis not present

## 2023-05-15 LAB — CBC WITH DIFFERENTIAL/PLATELET
Abs Immature Granulocytes: 0.05 10*3/uL (ref 0.00–0.07)
Basophils Absolute: 0 10*3/uL (ref 0.0–0.1)
Basophils Relative: 1 %
Eosinophils Absolute: 0.1 10*3/uL (ref 0.0–0.5)
Eosinophils Relative: 1 %
HCT: 26.7 % — ABNORMAL LOW (ref 36.0–46.0)
Hemoglobin: 8.4 g/dL — ABNORMAL LOW (ref 12.0–15.0)
Immature Granulocytes: 1 %
Lymphocytes Relative: 21 %
Lymphs Abs: 1.3 10*3/uL (ref 0.7–4.0)
MCH: 28.6 pg (ref 26.0–34.0)
MCHC: 31.5 g/dL (ref 30.0–36.0)
MCV: 90.8 fL (ref 80.0–100.0)
Monocytes Absolute: 0.5 10*3/uL (ref 0.1–1.0)
Monocytes Relative: 8 %
Neutro Abs: 4.4 10*3/uL (ref 1.7–7.7)
Neutrophils Relative %: 68 %
Platelets: 260 10*3/uL (ref 150–400)
RBC: 2.94 MIL/uL — ABNORMAL LOW (ref 3.87–5.11)
RDW: 15.6 % — ABNORMAL HIGH (ref 11.5–15.5)
WBC: 6.3 10*3/uL (ref 4.0–10.5)
nRBC: 0 % (ref 0.0–0.2)

## 2023-05-15 LAB — BASIC METABOLIC PANEL
Anion gap: 9 (ref 5–15)
BUN: 29 mg/dL — ABNORMAL HIGH (ref 8–23)
CO2: 27 mmol/L (ref 22–32)
Calcium: 9.9 mg/dL (ref 8.9–10.3)
Chloride: 106 mmol/L (ref 98–111)
Creatinine, Ser: 0.56 mg/dL (ref 0.44–1.00)
GFR, Estimated: >60 mL/min (ref >60)
Glucose, Bld: 137 mg/dL — ABNORMAL HIGH (ref 70–99)
Potassium: 3.5 mmol/L (ref 3.5–5.1)
Sodium: 142 mmol/L (ref 135–145)

## 2023-05-16 DIAGNOSIS — R1310 Dysphagia, unspecified: Secondary | ICD-10-CM | POA: Diagnosis not present

## 2023-05-16 DIAGNOSIS — F0392 Unspecified dementia, unspecified severity, with psychotic disturbance: Secondary | ICD-10-CM | POA: Diagnosis not present

## 2023-05-16 DIAGNOSIS — G9340 Encephalopathy, unspecified: Secondary | ICD-10-CM | POA: Diagnosis not present

## 2023-05-16 DIAGNOSIS — J9600 Acute respiratory failure, unspecified whether with hypoxia or hypercapnia: Secondary | ICD-10-CM | POA: Diagnosis not present

## 2023-05-16 DIAGNOSIS — Z9911 Dependence on respirator [ventilator] status: Secondary | ICD-10-CM | POA: Diagnosis not present

## 2023-05-16 DIAGNOSIS — I10 Essential (primary) hypertension: Secondary | ICD-10-CM | POA: Diagnosis not present

## 2023-05-17 DIAGNOSIS — F0392 Unspecified dementia, unspecified severity, with psychotic disturbance: Secondary | ICD-10-CM | POA: Diagnosis not present

## 2023-05-17 DIAGNOSIS — G9340 Encephalopathy, unspecified: Secondary | ICD-10-CM | POA: Diagnosis not present

## 2023-05-17 DIAGNOSIS — F32A Depression, unspecified: Secondary | ICD-10-CM | POA: Diagnosis not present

## 2023-05-17 DIAGNOSIS — F0390 Unspecified dementia without behavioral disturbance: Secondary | ICD-10-CM | POA: Diagnosis not present

## 2023-05-17 DIAGNOSIS — I10 Essential (primary) hypertension: Secondary | ICD-10-CM | POA: Diagnosis not present

## 2023-05-17 DIAGNOSIS — E039 Hypothyroidism, unspecified: Secondary | ICD-10-CM | POA: Diagnosis not present

## 2023-05-17 DIAGNOSIS — Z9911 Dependence on respirator [ventilator] status: Secondary | ICD-10-CM | POA: Diagnosis not present

## 2023-05-17 DIAGNOSIS — J9610 Chronic respiratory failure, unspecified whether with hypoxia or hypercapnia: Secondary | ICD-10-CM | POA: Diagnosis not present

## 2023-05-19 DIAGNOSIS — Z931 Gastrostomy status: Secondary | ICD-10-CM | POA: Diagnosis not present

## 2023-05-19 DIAGNOSIS — J9611 Chronic respiratory failure with hypoxia: Secondary | ICD-10-CM | POA: Diagnosis not present

## 2023-05-19 DIAGNOSIS — Z930 Tracheostomy status: Secondary | ICD-10-CM | POA: Diagnosis not present

## 2023-05-19 DIAGNOSIS — Z9911 Dependence on respirator [ventilator] status: Secondary | ICD-10-CM | POA: Diagnosis not present

## 2023-05-19 DIAGNOSIS — R1310 Dysphagia, unspecified: Secondary | ICD-10-CM | POA: Diagnosis not present

## 2023-05-23 ENCOUNTER — Encounter: Payer: Medicare HMO | Admitting: Psychology

## 2023-08-23 ENCOUNTER — Ambulatory Visit: Admitting: Physician Assistant

## 2023-08-23 ENCOUNTER — Ambulatory Visit: Payer: Medicare HMO | Admitting: Physician Assistant

## 2023-08-28 IMAGING — CT CT CHEST W/O CM
1 series · 15 of 34 positions shown, 19 images · non-contrast
Comparison: CT chest October 03, 2018.

CLINICAL DATA: Pulmonary nodule 1XG.G (UWG-SN-CM).  Follow-up.

EXAM:
CT CHEST WITHOUT CONTRAST
TECHNIQUE: Multidetector CT imaging of the chest was performed following the
standard protocol without IV contrast.

[Series 2: chest w/(date) · axial · 0.67mm/px · z∈[-298,-58]mm · 15 of 142 slices shown, 19 images]
[im 11/142  mediastinal]
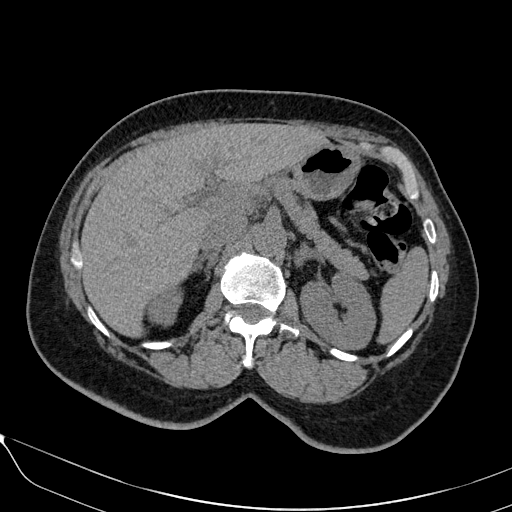
[im 11/142  lung]
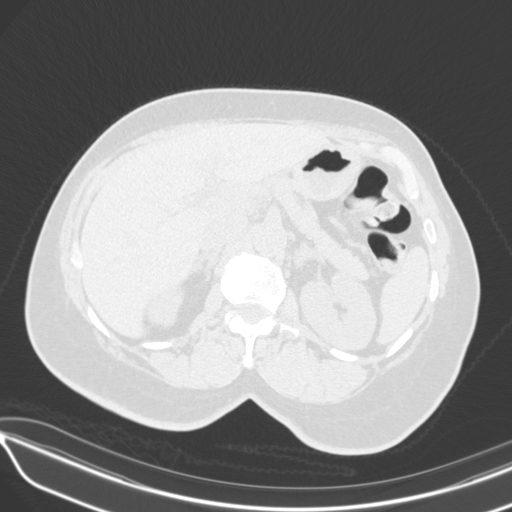
[im 21/142  lung]
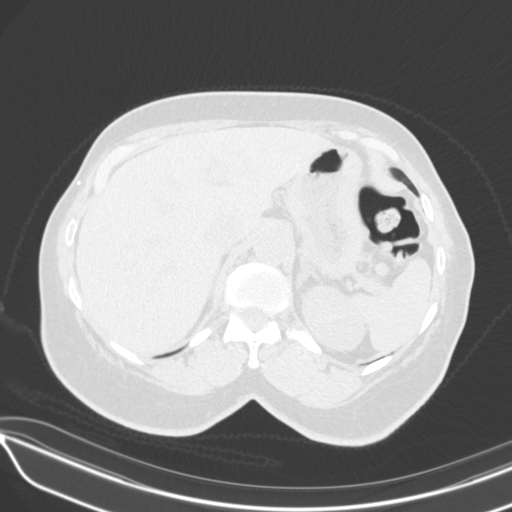
[im 29/142  lung]
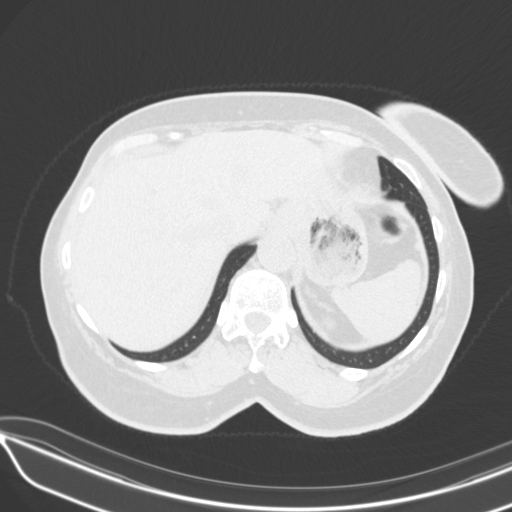
[im 37/142  lung]
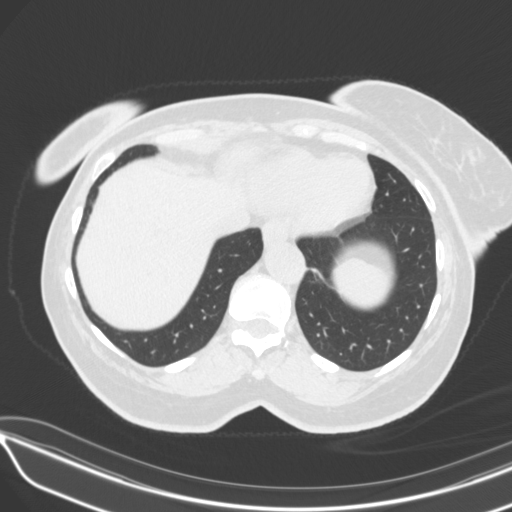
[im 48/142  mediastinal]
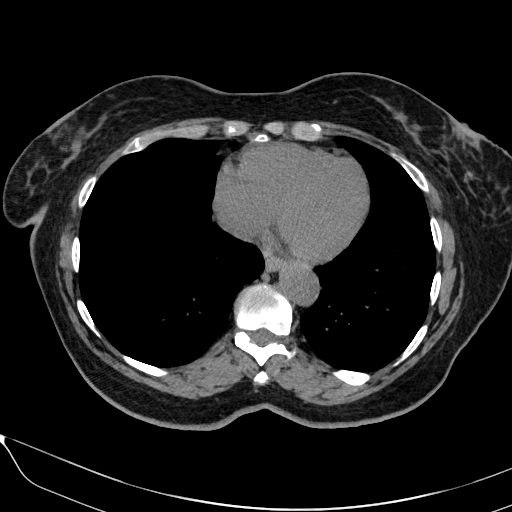
[im 48/142  lung]
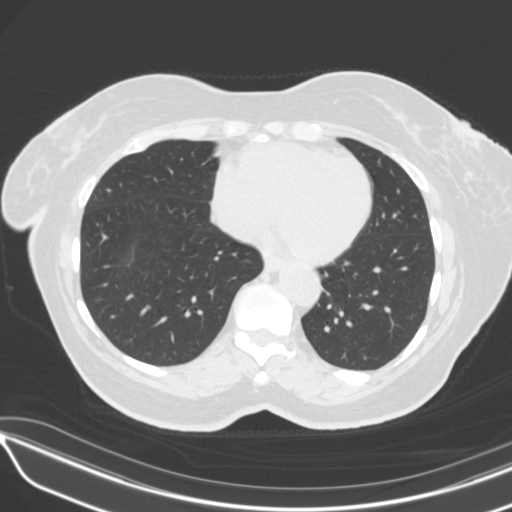
[im 57/142  lung]
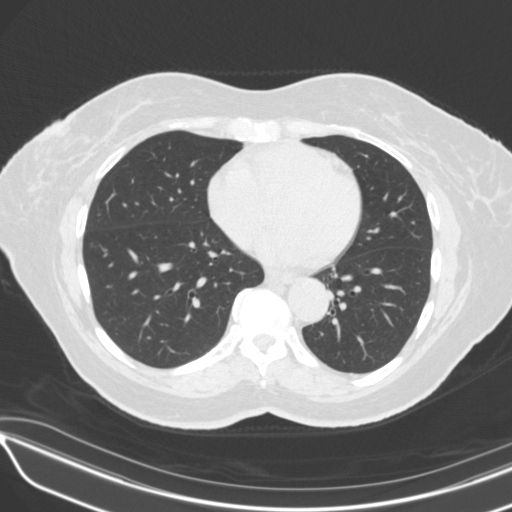
[im 63/142  lung]
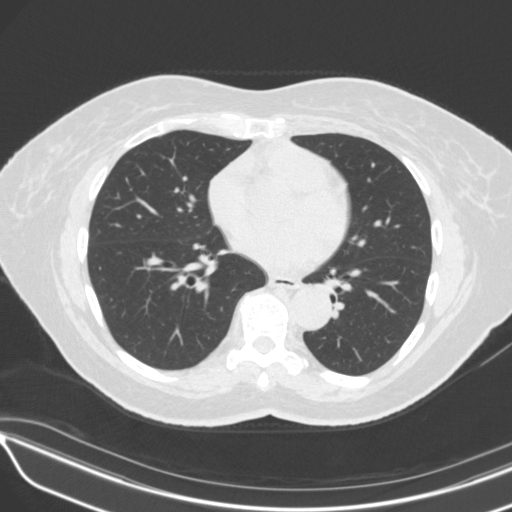
[im 74/142  lung]
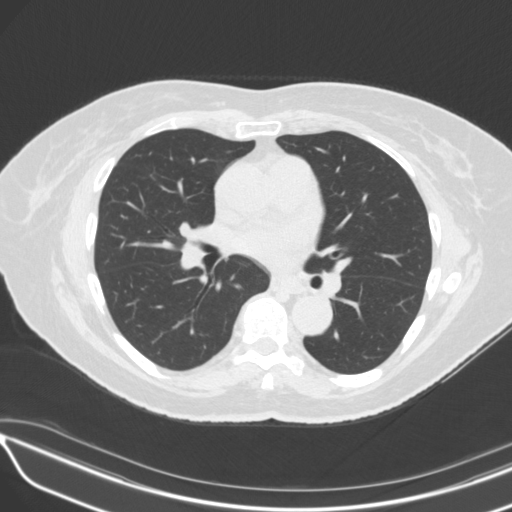
[im 79/142  mediastinal]
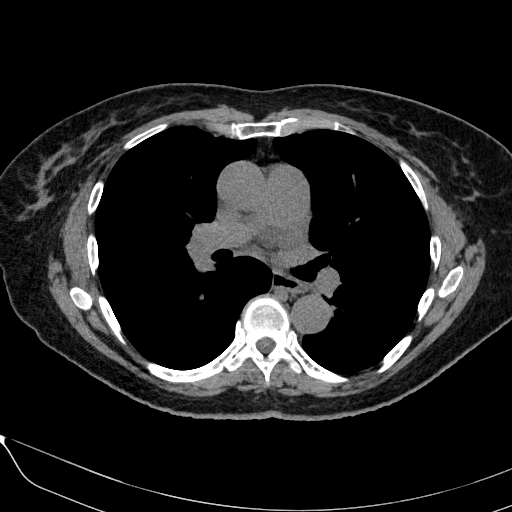
[im 79/142  lung]
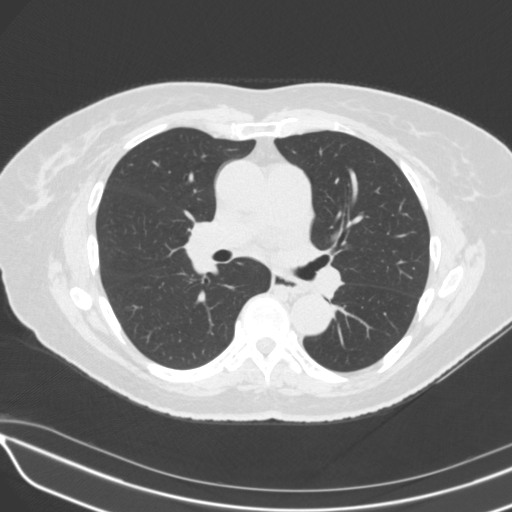
[im 85/142  lung]
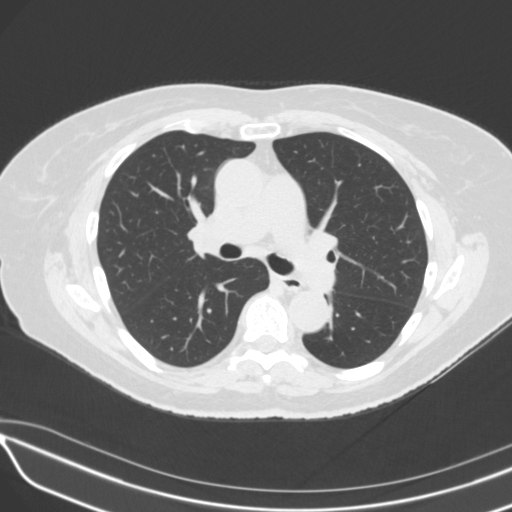
[im 95/142  lung]
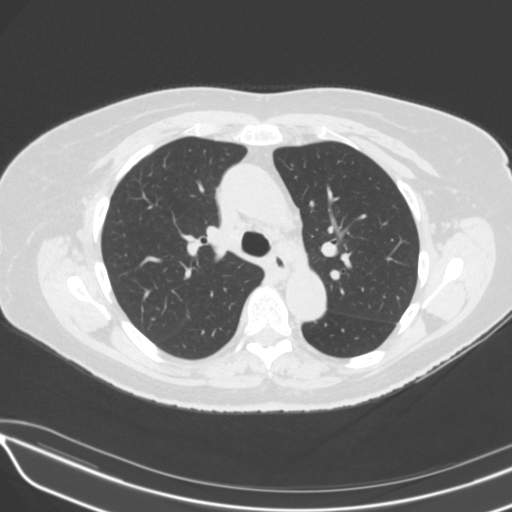
[im 105/142  lung]
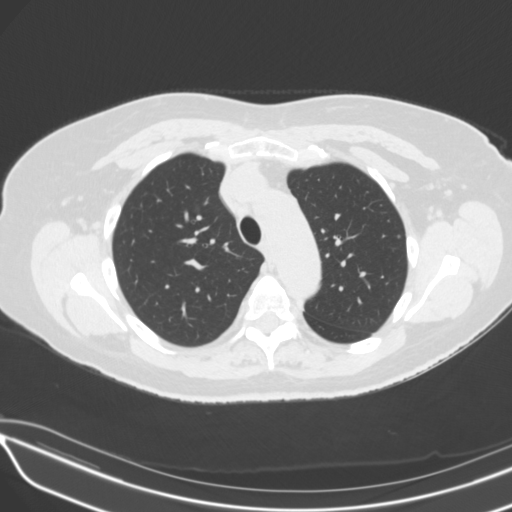
[im 113/142  mediastinal]
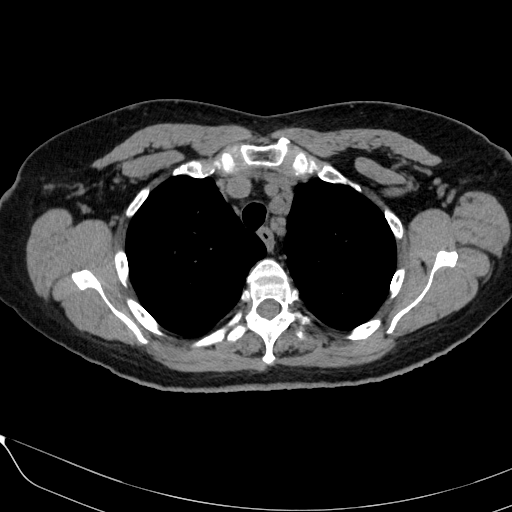
[im 113/142  lung]
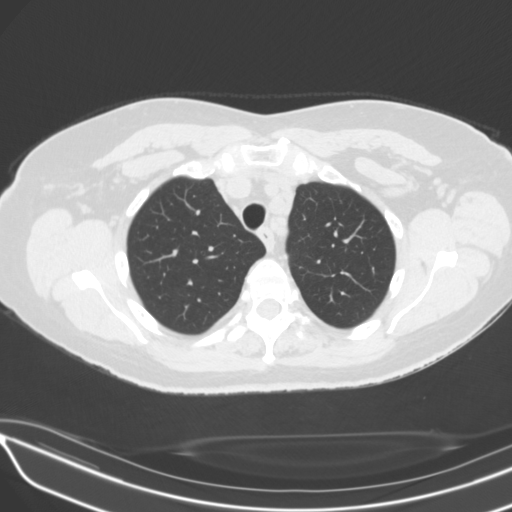
[im 121/142  lung]
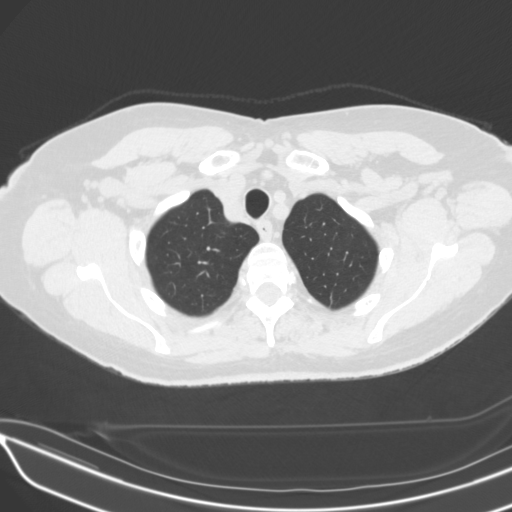
[im 131/142  lung]
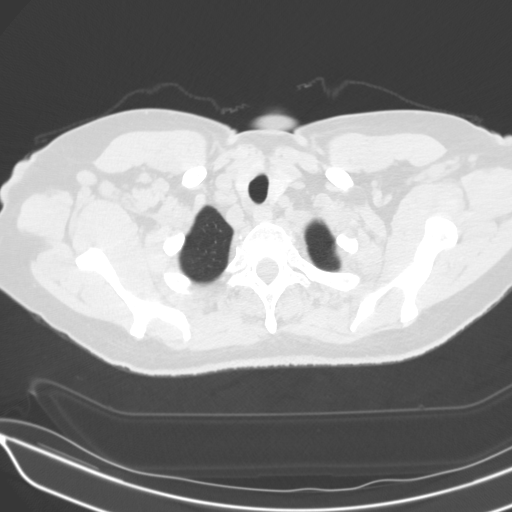

[15 of 34 positions shown; findings below may reference images not displayed]

FINDINGS: Cardiovascular: No significant vascular findings. Normal heart size.
No pericardial effusion.

Mediastinum/Nodes: No change in prominent although not
pathologically enlarged mediastinal and AP window lymph nodes.
Esophagus and trachea demonstrate no significant findings. Stable
probable 1 cm nodule in the posterior right thyroid, no follow-up
imaging recommended (ref: [HOSPITAL]. [DATE]): 143-50).

Lungs/Pleura: No change in an 8 mm triangular nodule in the superior
segment of the left lower lobe (series 5, image 72). Similar 2 mm
calcified granuloma in the left lower lobe. Similar 2 mm pulmonary
nodule in the left upper lobe (series 5, image 33). Similar 3 mm
ground-glass pulmonary nodule in the right lower lobe (series 5,
image 85). Similar additional 2 mm ground-glass nodule in the more
lateral right lower lobe (series 5, image 82). When accounting for
differences in technique, similar 5 mm ground-glass nodule in the
posterior aspect of the right upper lobe that in retrospect was
present on more remote 2815 prior and appeared similar then (series
3, image 57 on that study). Similar tiny linear area of probable
scarring in the right upper lobe (series 5, image 40). Similar 2 mm
ground-glass nodule in the lateral right middle lobe (series 5,
image 76). No consolidation. No pleural effusions or pneumothorax.

Upper Abdomen: No acute abnormality.

Musculoskeletal: No fracture is seen.
IMPRESSION: Similar size of multiple small solid and ground-glass pulmonary
nodules, the largest measuring 8 mm as detailed above. Given

## 2023-10-03 IMAGING — MR MR ABDOMEN WO/W CM MRCP
16 of 20 series · 38 of 48 positions shown · IV contrast (multihance)
Comparison: Abdominal MRI 08/23/2019.

CLINICAL DATA: 69-year-old female with history of unexplained
weight loss of 50 pounds in the past year and a half.

EXAM:
MRI ABDOMEN WITHOUT AND WITH CONTRAST (INCLUDING MRCP)
TECHNIQUE: Multiplanar multisequence MR imaging of the abdomen was performed
both before and after the administration of intravenous contrast.
Heavily T2-weighted images of the biliary and pancreatic ducts were
obtained, and three-dimensional MRCP images were rendered by post
processing.
CONTRAST:  14mL MULTIHANCE GADOBENATE DIMEGLUMINE 529 MG/ML IV SOLN

[Series 3: T2 · coronal · 5.0mm · 1.41mm/px · 1 of 36 slices shown (1 of 4)]
[im 1/36]
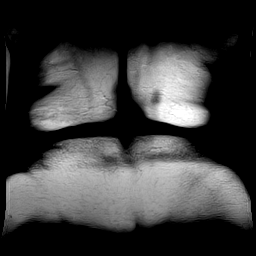

[Series 4: T1 · axial · 3.0mm · 1.12mm/px · z∈[-82,+107]mm · 4 of 128 slices shown]
[im 1/128]
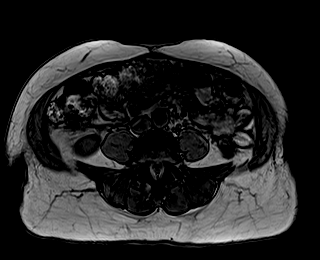
[im 43/128]
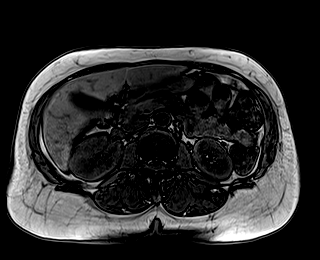
[im 85/128]
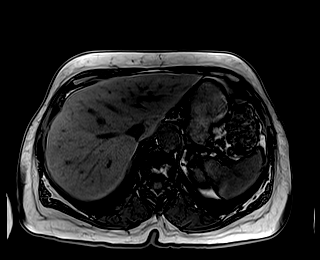
[im 128/128]
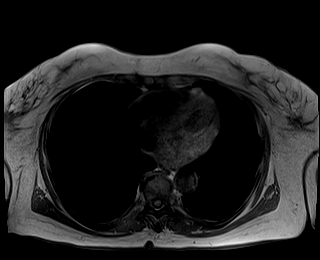

[Series 5: T2 · axial · 5.0mm · 1.48mm/px · 1 of 38 slices shown (2 of 4)]
[im 1/38]
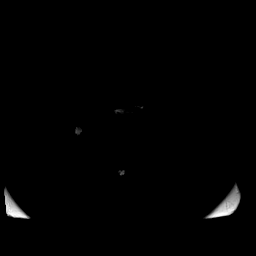

[Series 6: DWI · axial · 5.0mm · 1.34mm/px · z∈[-95,+115]mm · 4 of 108 slices shown (1 of 2)]
[im 1/108]
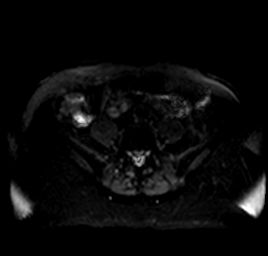
[im 36/108]
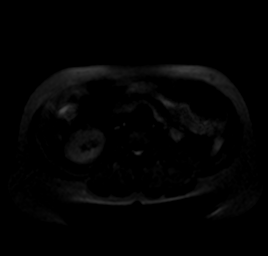
[im 72/108]
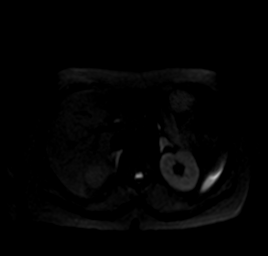
[im 108/108]
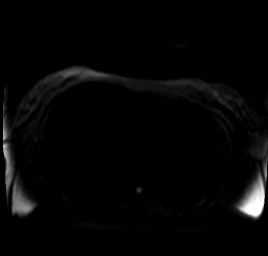

[Series 7: DWI · axial · 5.0mm · 1.34mm/px · 1 of 36 slices shown (2 of 2)]
[im 1/36]
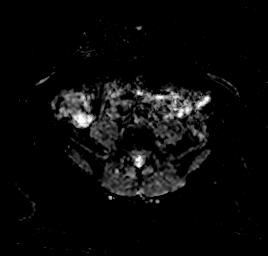

[Series 9: MRCP · coronal · 1.0mm · 0.49mm/px · 3 of 64 slices shown]
[im 1/64]
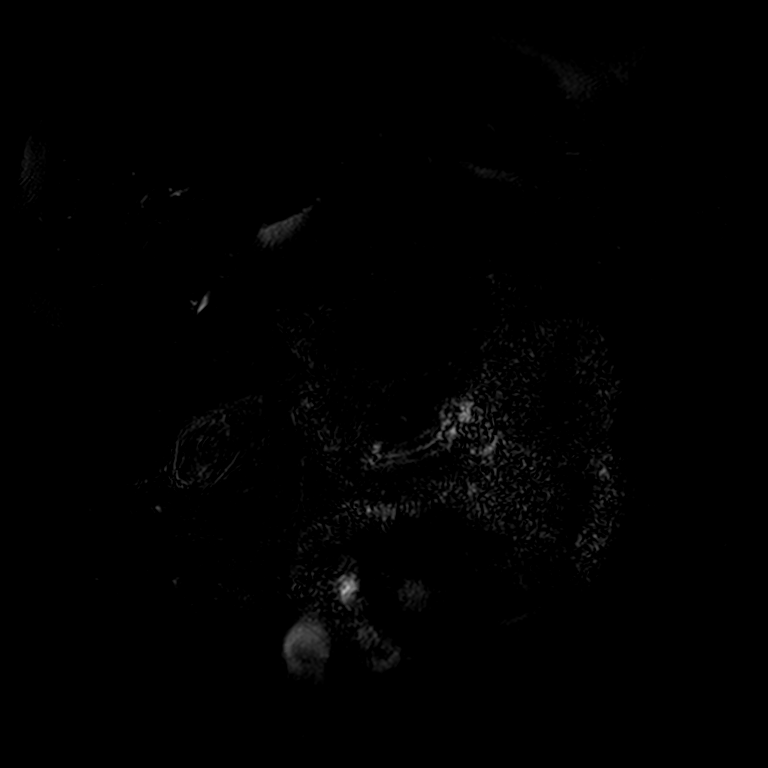
[im 32/64]
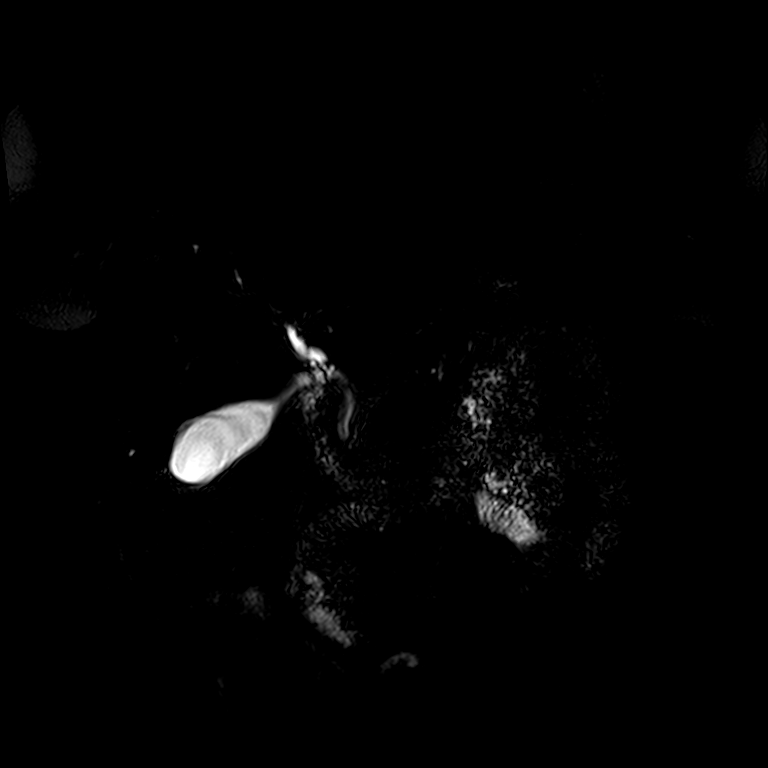
[im 64/64]
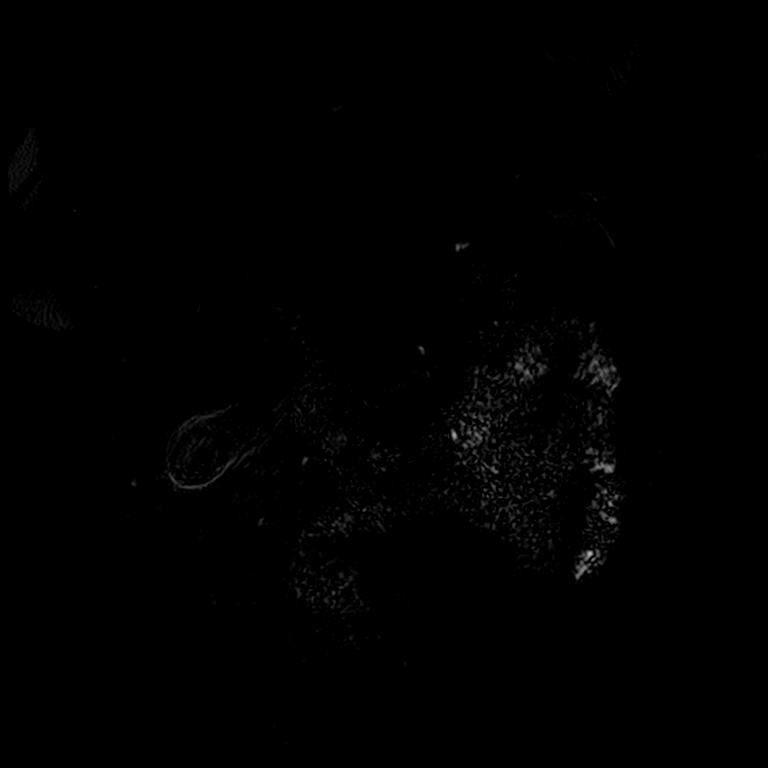

[Series 12: T2 · axial · 6.0mm · 1.22mm/px · 1 of 30 slices shown (3 of 4)]
[im 1/30]
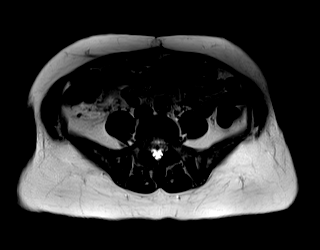

[Series 13: bSSFP · axial · 5.0mm · 1.25mm/px · 1 of 34 slices shown]
[im 1/34]
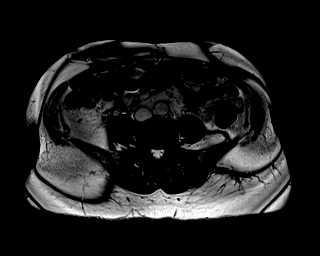

[Series 16: T2 · coronal · 3.0mm · 1.19mm/px · 1 of 19 slices shown (4 of 4)]
[im 1/19]
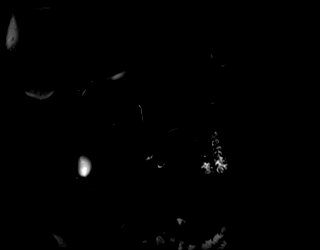

[Series 19: T1 dynamic · axial · non-contrast · 3.0mm · 1.25mm/px · z∈[-104,+109]mm · 3 of 72 slices shown]
[im 1/72]
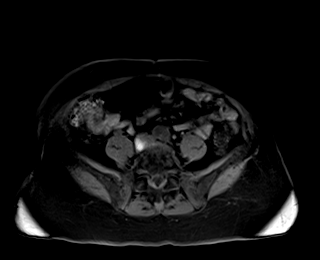
[im 36/72]
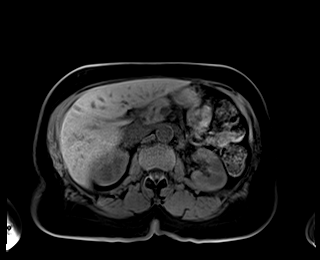
[im 72/72]
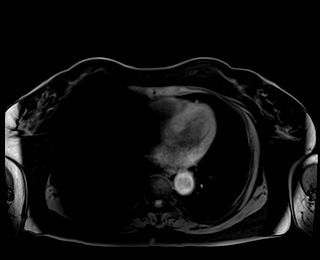

[Series 20: T1 dynamic post-contrast · axial · 3.0mm · 1.25mm/px · z∈[-104,+109]mm · 3 of 72 slices shown (1 of 6)]
[im 1/72]
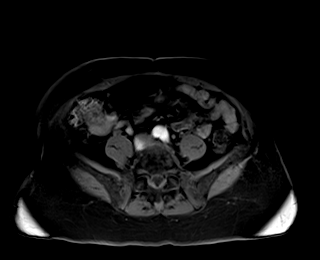
[im 36/72]
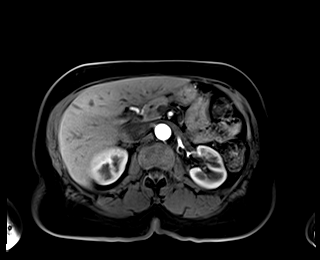
[im 72/72]
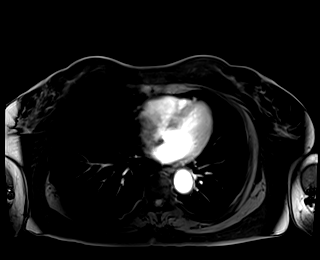

[Series 21: T1 dynamic post-contrast · axial · 3.0mm · 1.25mm/px · z∈[-104,+109]mm · 3 of 72 slices shown (2 of 6)]
[im 1/72]
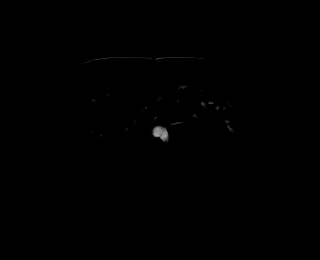
[im 36/72]
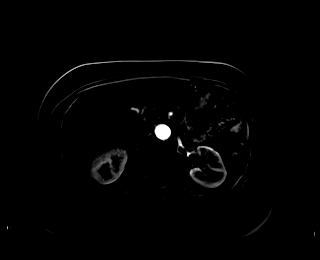
[im 72/72]
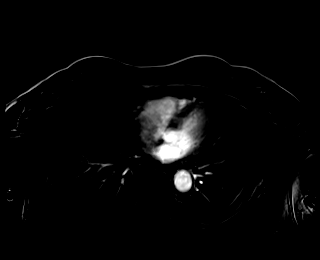

[Series 22: T1 dynamic post-contrast · axial · 3.0mm · 1.25mm/px · z∈[-104,+109]mm · 3 of 72 slices shown (3 of 6)]
[im 1/72]
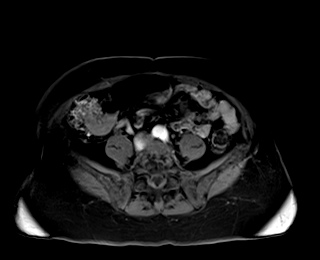
[im 36/72]
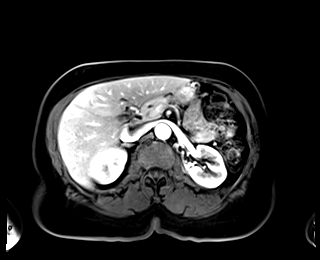
[im 72/72]
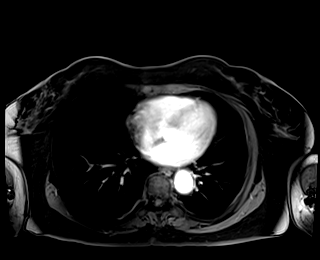

[Series 23: T1 dynamic post-contrast · axial · 3.0mm · 1.25mm/px · z∈[-104,+109]mm · 3 of 72 slices shown (4 of 6)]
[im 1/72]
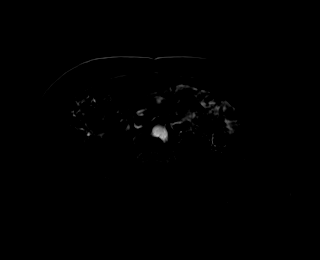
[im 36/72]
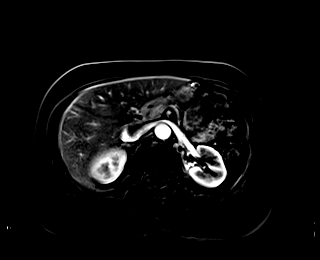
[im 72/72]
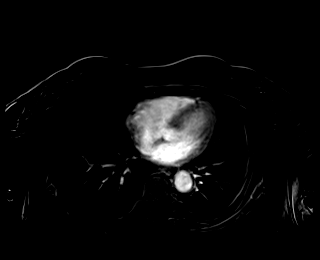

[Series 24: T1 dynamic post-contrast · axial · 3.0mm · 1.25mm/px · z∈[-104,+109]mm · 3 of 72 slices shown (5 of 6)]
[im 1/72]
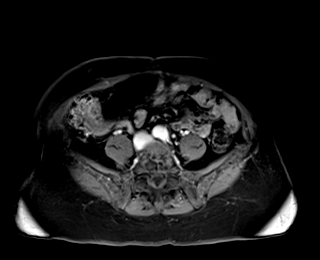
[im 36/72]
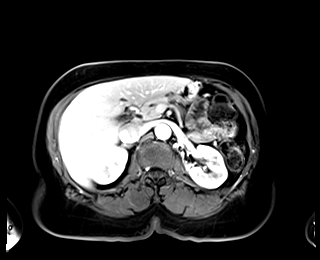
[im 72/72]
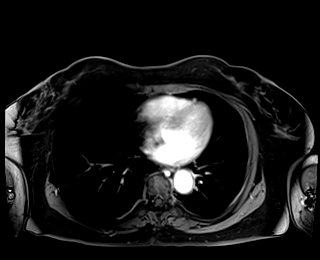

[Series 25: T1 dynamic post-contrast · axial · 3.0mm · 1.25mm/px · z∈[-104,+109]mm · 3 of 72 slices shown (6 of 6)]
[im 1/72]
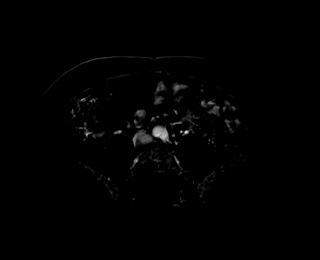
[im 36/72]
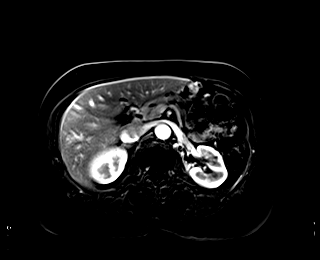
[im 72/72]
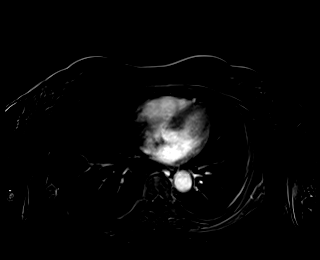

[38 of 48 positions shown; findings below may reference images not displayed]

FINDINGS: Lower chest: Unremarkable.

Hepatobiliary: No suspicious cystic or solid hepatic lesions. No
intra or extrahepatic biliary ductal dilatation noted on MRCP
images. There is some amorphous high T1 signal intensity and low T2
signal intensity lying dependently in the gallbladder, compatible
with biliary sludge. Gallbladder is not distended. Normal
gallbladder wall thickness. No pericholecystic fluid or surrounding
inflammatory changes. No filling defect in the common bile duct to
suggest choledocholithiasis. Common bile duct is normal in caliber
measuring 5 mm in the porta hepatis.

Pancreas: No pancreatic mass. No pancreatic ductal dilatation. No
pancreatic or peripancreatic fluid collections or inflammatory
changes.

Spleen:  Unremarkable.

Adrenals/Urinary Tract: Subcentimeter T1 hypointense, T2
hyperintense, nonenhancing lesion in the lower pole of the right
kidney is compatible with a tiny simple cyst. No suspicious renal
lesions. No hydroureteronephrosis in the visualized portions of the
abdomen. Bilateral adrenal glands are normal in appearance.

Stomach/Bowel: Visualized portions are unremarkable.

Vascular/Lymphatic: No aneurysm identified in the visualized
abdominal vasculature. No lymphadenopathy noted in the abdomen.

Other: No significant volume of ascites noted in the visualized
portions of the peritoneal cavity.

Musculoskeletal: No aggressive appearing osseous lesions are noted
in the visualized portions of the skeleton.
IMPRESSION: 1. No findings are noted in the abdomen to account for the patient's
unexplained weight loss.
2. Biliary sludge lying dependently in the gallbladder, without
evidence to suggest an acute cholecystitis at this time.
